# Patient Record
Sex: Female | Born: 1935 | Race: Black or African American | Hispanic: No | Marital: Married | Smoking: Never smoker
Health system: Southern US, Community
[De-identification: ages and names within clinical notes are randomized; demographics above are authoritative.]

## PROBLEM LIST (undated history)

## (undated) DIAGNOSIS — I679 Cerebrovascular disease, unspecified: Secondary | ICD-10-CM

## (undated) DIAGNOSIS — I10 Essential (primary) hypertension: Secondary | ICD-10-CM

## (undated) DIAGNOSIS — Z7901 Long term (current) use of anticoagulants: Secondary | ICD-10-CM

## (undated) DIAGNOSIS — N183 Chronic kidney disease, stage 3 unspecified: Secondary | ICD-10-CM

## (undated) DIAGNOSIS — M720 Palmar fascial fibromatosis [Dupuytren]: Secondary | ICD-10-CM

## (undated) DIAGNOSIS — I482 Chronic atrial fibrillation, unspecified: Secondary | ICD-10-CM

## (undated) DIAGNOSIS — E119 Type 2 diabetes mellitus without complications: Secondary | ICD-10-CM

## (undated) DIAGNOSIS — E785 Hyperlipidemia, unspecified: Secondary | ICD-10-CM

## (undated) DIAGNOSIS — C189 Malignant neoplasm of colon, unspecified: Secondary | ICD-10-CM

## (undated) HISTORY — DX: Malignant neoplasm of colon, unspecified: C18.9

## (undated) HISTORY — DX: Type 2 diabetes mellitus without complications: E11.9

## (undated) HISTORY — DX: Essential (primary) hypertension: I10

## (undated) HISTORY — DX: Chronic kidney disease, stage 3 (moderate): N18.3

## (undated) HISTORY — DX: Chronic kidney disease, stage 3 unspecified: N18.30

## (undated) HISTORY — PX: APPENDECTOMY: SHX54

## (undated) HISTORY — PX: COLONOSCOPY: SHX174

## (undated) HISTORY — DX: Long term (current) use of anticoagulants: Z79.01

## (undated) HISTORY — DX: Palmar fascial fibromatosis (dupuytren): M72.0

## (undated) HISTORY — PX: TUBAL LIGATION: SHX77

## (undated) HISTORY — DX: Cerebrovascular disease, unspecified: I67.9

## (undated) HISTORY — DX: Chronic atrial fibrillation, unspecified: I48.20

## (undated) HISTORY — DX: Hyperlipidemia, unspecified: E78.5

---

## 2006-02-20 ENCOUNTER — Inpatient Hospital Stay (HOSPITAL_COMMUNITY): Admission: EM | Admit: 2006-02-20 | Discharge: 2006-02-23 | Payer: Self-pay | Admitting: *Deleted

## 2006-02-20 ENCOUNTER — Encounter (INDEPENDENT_AMBULATORY_CARE_PROVIDER_SITE_OTHER): Payer: Self-pay | Admitting: Interventional Cardiology

## 2006-02-20 ENCOUNTER — Encounter (INDEPENDENT_AMBULATORY_CARE_PROVIDER_SITE_OTHER): Payer: Self-pay | Admitting: Neurology

## 2006-02-22 ENCOUNTER — Ambulatory Visit: Payer: Self-pay | Admitting: Physical Medicine & Rehabilitation

## 2006-02-23 ENCOUNTER — Inpatient Hospital Stay (HOSPITAL_COMMUNITY)
Admission: RE | Admit: 2006-02-23 | Discharge: 2006-03-02 | Payer: Self-pay | Admitting: Physical Medicine & Rehabilitation

## 2006-03-27 ENCOUNTER — Ambulatory Visit: Payer: Self-pay | Admitting: Physical Medicine & Rehabilitation

## 2006-03-27 ENCOUNTER — Encounter
Admission: RE | Admit: 2006-03-27 | Discharge: 2006-06-25 | Payer: Self-pay | Admitting: Physical Medicine & Rehabilitation

## 2006-05-12 ENCOUNTER — Ambulatory Visit: Payer: Self-pay | Admitting: Physical Medicine & Rehabilitation

## 2006-06-22 ENCOUNTER — Ambulatory Visit: Payer: Self-pay | Admitting: Physical Medicine & Rehabilitation

## 2006-06-22 ENCOUNTER — Encounter
Admission: RE | Admit: 2006-06-22 | Discharge: 2006-09-20 | Payer: Self-pay | Admitting: Physical Medicine & Rehabilitation

## 2006-06-29 ENCOUNTER — Encounter (HOSPITAL_COMMUNITY)
Admission: RE | Admit: 2006-06-29 | Discharge: 2006-07-17 | Payer: Self-pay | Admitting: Physical Medicine & Rehabilitation

## 2006-07-20 ENCOUNTER — Encounter (HOSPITAL_COMMUNITY)
Admission: RE | Admit: 2006-07-20 | Discharge: 2006-08-19 | Payer: Self-pay | Admitting: Physical Medicine & Rehabilitation

## 2006-08-04 ENCOUNTER — Encounter
Admission: RE | Admit: 2006-08-04 | Discharge: 2006-11-02 | Payer: Self-pay | Admitting: Physical Medicine & Rehabilitation

## 2006-08-07 ENCOUNTER — Ambulatory Visit: Payer: Self-pay | Admitting: Physical Medicine & Rehabilitation

## 2006-09-26 ENCOUNTER — Ambulatory Visit: Payer: Self-pay | Admitting: Physical Medicine & Rehabilitation

## 2007-07-19 DIAGNOSIS — C189 Malignant neoplasm of colon, unspecified: Secondary | ICD-10-CM

## 2007-07-19 HISTORY — DX: Malignant neoplasm of colon, unspecified: C18.9

## 2007-12-17 HISTORY — PX: HEMICOLECTOMY: SHX854

## 2008-01-04 ENCOUNTER — Ambulatory Visit (HOSPITAL_COMMUNITY): Admission: RE | Admit: 2008-01-04 | Discharge: 2008-01-04 | Payer: Self-pay | Admitting: Internal Medicine

## 2008-01-07 ENCOUNTER — Ambulatory Visit: Payer: Self-pay | Admitting: Internal Medicine

## 2008-01-07 ENCOUNTER — Ambulatory Visit: Payer: Self-pay | Admitting: Gastroenterology

## 2008-01-07 ENCOUNTER — Inpatient Hospital Stay (HOSPITAL_COMMUNITY): Admission: AD | Admit: 2008-01-07 | Discharge: 2008-01-19 | Payer: Self-pay | Admitting: Internal Medicine

## 2008-01-08 ENCOUNTER — Encounter: Payer: Self-pay | Admitting: Internal Medicine

## 2008-01-08 ENCOUNTER — Ambulatory Visit: Payer: Self-pay | Admitting: Internal Medicine

## 2008-01-11 ENCOUNTER — Encounter (INDEPENDENT_AMBULATORY_CARE_PROVIDER_SITE_OTHER): Payer: Self-pay | Admitting: General Surgery

## 2008-01-14 ENCOUNTER — Encounter: Payer: Self-pay | Admitting: Cardiology

## 2008-01-18 ENCOUNTER — Ambulatory Visit: Payer: Self-pay | Admitting: Oncology

## 2008-01-30 ENCOUNTER — Ambulatory Visit: Payer: Self-pay | Admitting: Cardiology

## 2008-02-06 ENCOUNTER — Ambulatory Visit: Payer: Self-pay | Admitting: Cardiology

## 2008-02-06 ENCOUNTER — Ambulatory Visit (HOSPITAL_COMMUNITY): Admission: RE | Admit: 2008-02-06 | Discharge: 2008-02-06 | Payer: Self-pay | Admitting: Cardiology

## 2008-03-12 ENCOUNTER — Ambulatory Visit: Payer: Self-pay | Admitting: Cardiology

## 2008-04-09 ENCOUNTER — Ambulatory Visit: Payer: Self-pay | Admitting: Cardiology

## 2008-04-16 ENCOUNTER — Encounter (HOSPITAL_COMMUNITY): Admission: RE | Admit: 2008-04-16 | Discharge: 2008-05-16 | Payer: Self-pay | Admitting: Oncology

## 2008-04-16 ENCOUNTER — Ambulatory Visit (HOSPITAL_COMMUNITY): Payer: Self-pay | Admitting: Oncology

## 2008-07-17 ENCOUNTER — Ambulatory Visit (HOSPITAL_COMMUNITY): Payer: Self-pay | Admitting: Oncology

## 2008-07-17 ENCOUNTER — Encounter (HOSPITAL_COMMUNITY): Admission: RE | Admit: 2008-07-17 | Discharge: 2008-08-16 | Payer: Self-pay | Admitting: Oncology

## 2008-10-08 ENCOUNTER — Encounter (HOSPITAL_COMMUNITY): Admission: RE | Admit: 2008-10-08 | Discharge: 2008-11-07 | Payer: Self-pay | Admitting: Oncology

## 2008-10-08 ENCOUNTER — Ambulatory Visit (HOSPITAL_COMMUNITY): Payer: Self-pay | Admitting: Oncology

## 2008-11-14 ENCOUNTER — Ambulatory Visit: Payer: Self-pay | Admitting: Cardiology

## 2008-11-20 ENCOUNTER — Ambulatory Visit: Payer: Self-pay | Admitting: Cardiology

## 2008-11-27 ENCOUNTER — Ambulatory Visit: Payer: Self-pay | Admitting: Cardiology

## 2008-12-04 ENCOUNTER — Ambulatory Visit: Payer: Self-pay | Admitting: Cardiology

## 2008-12-10 IMAGING — CR DG CHEST 2V
2 series · 2 of 2 positions shown · non-contrast
Comparison: 01/07/2008

CLINICAL DATA: Preop for GI bleed.

CHEST - 2 VIEW

[view not recorded (1 of 2)]
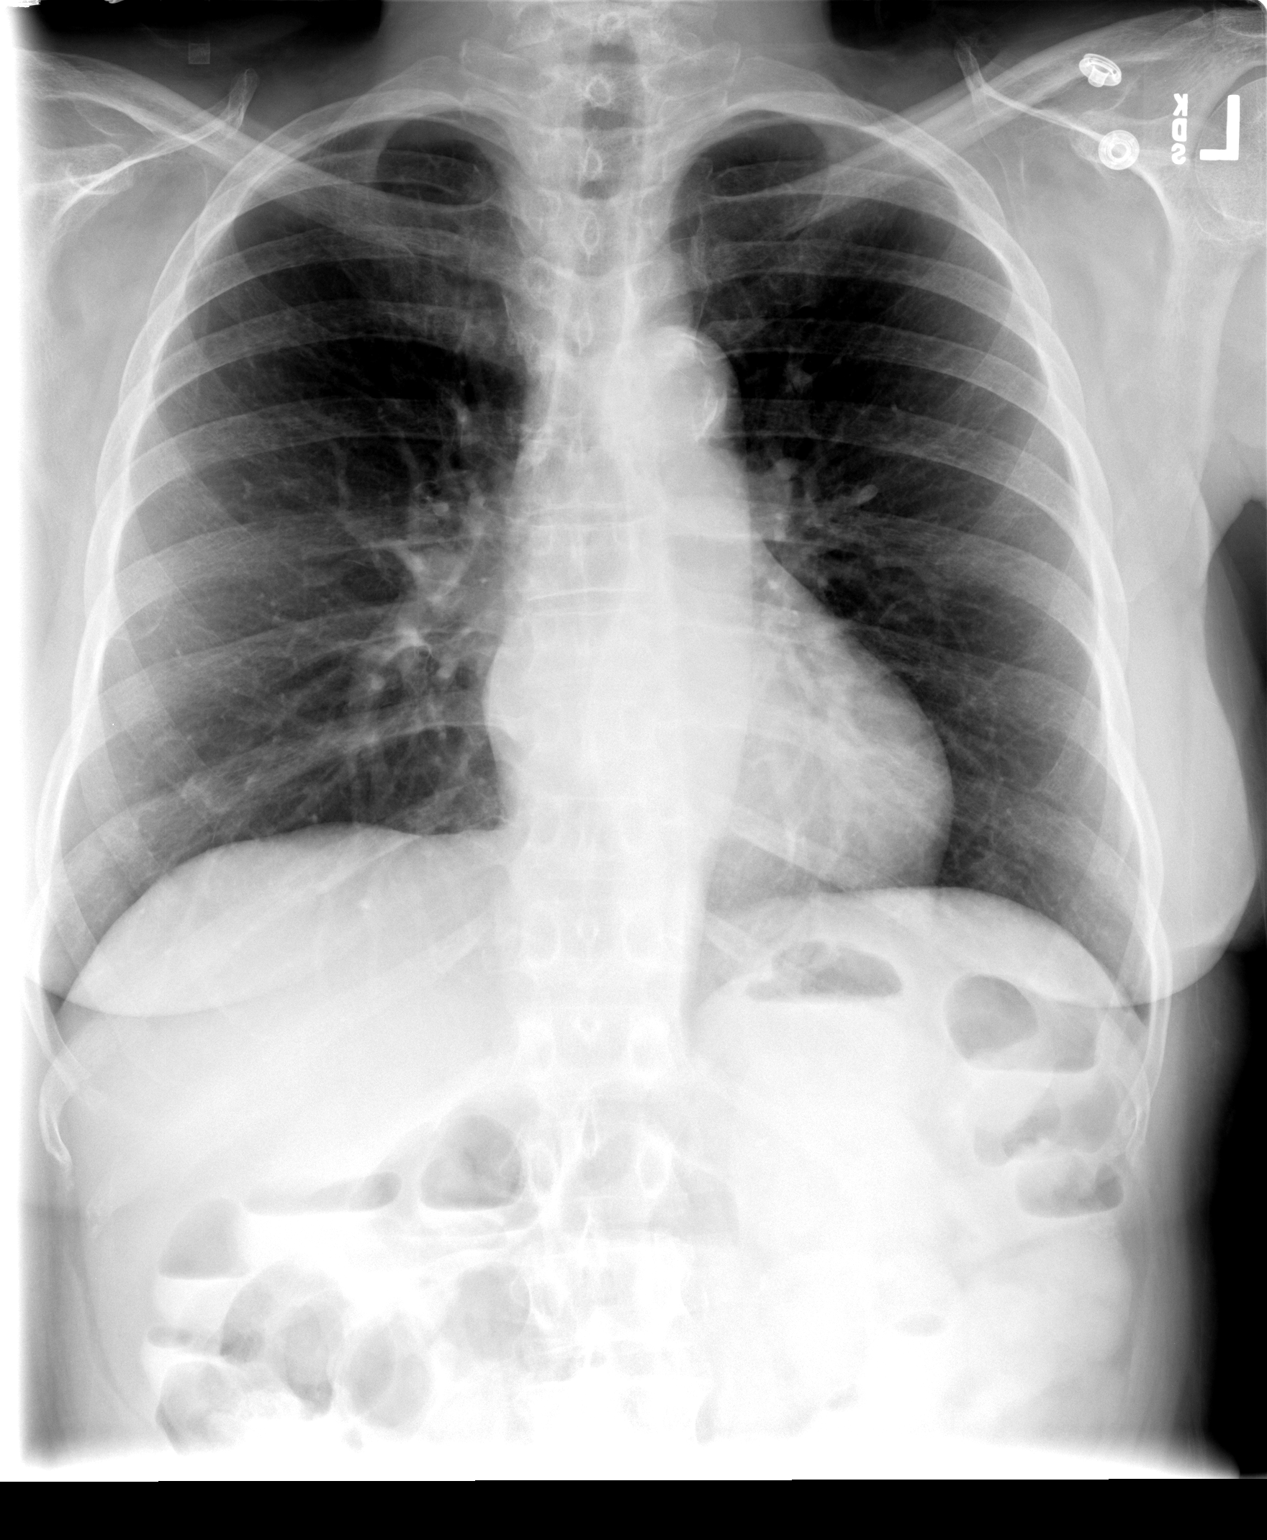

[view not recorded (2 of 2)]
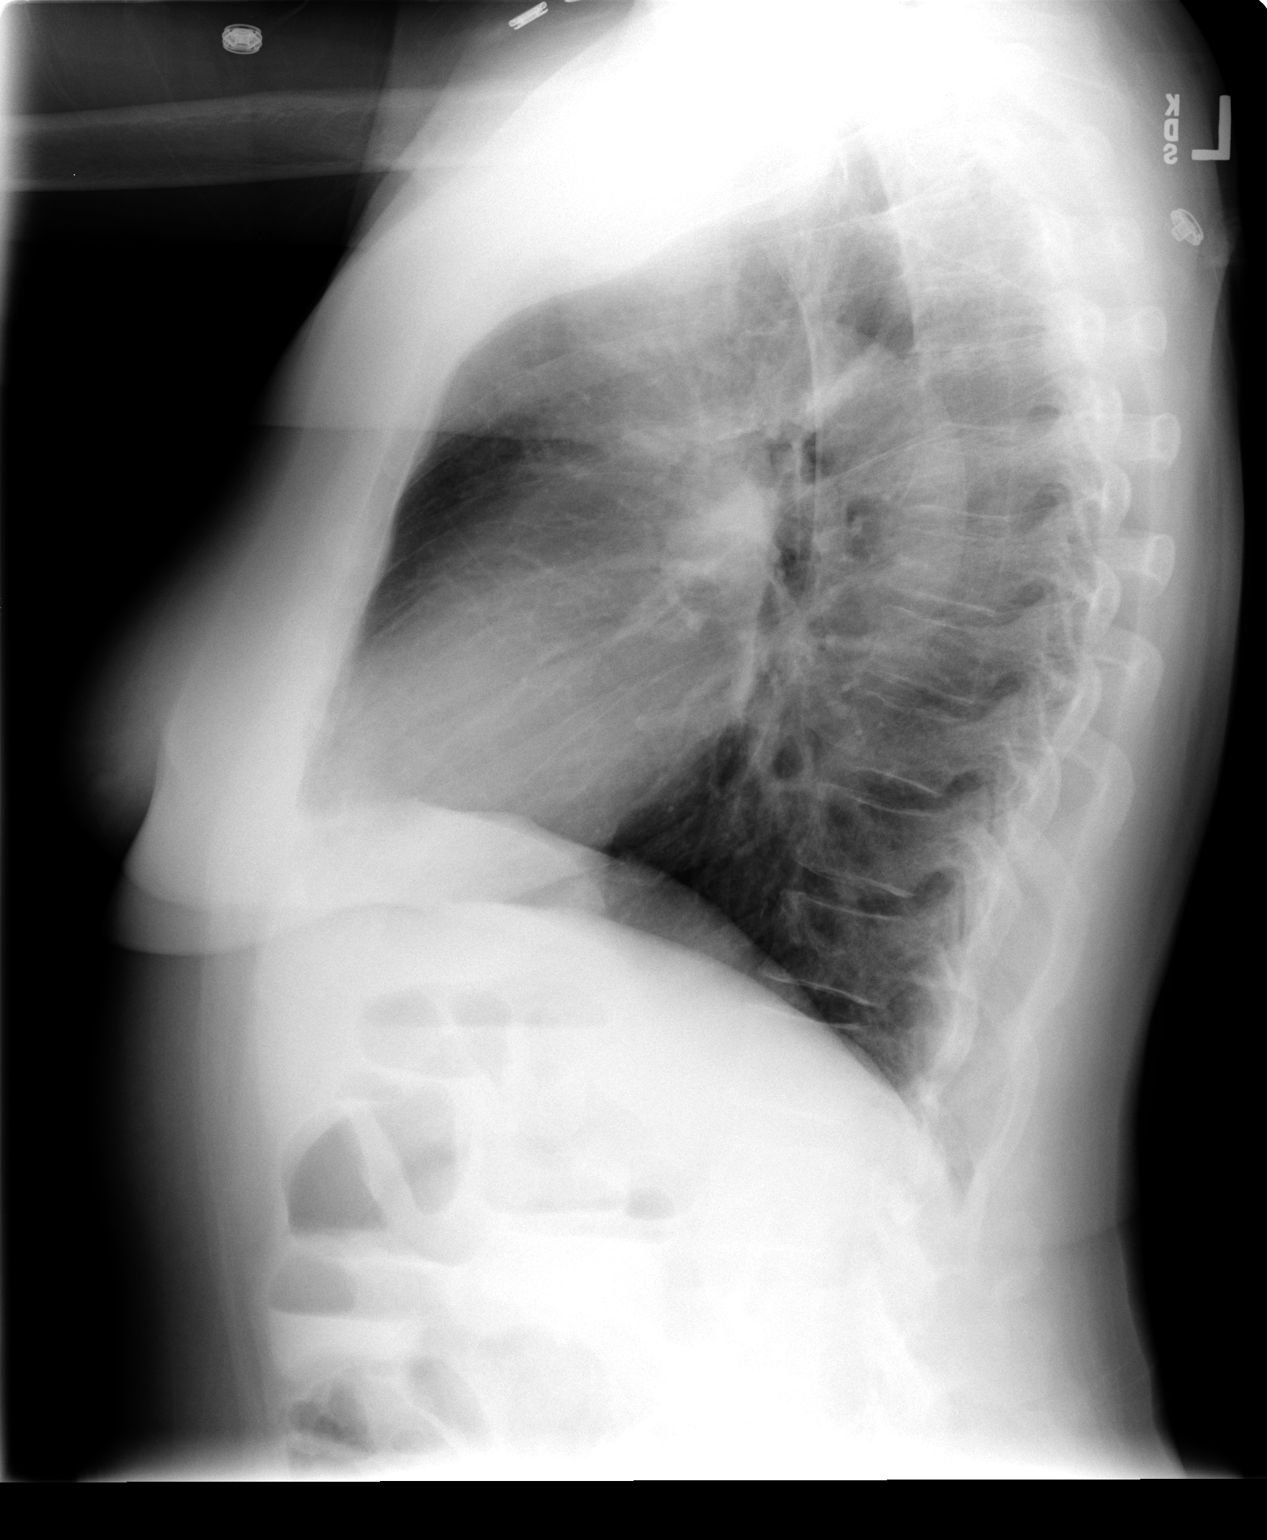

[2 of 2 positions shown; findings below may reference images not displayed]

FINDINGS: Midline trachea. Normal heart size.  Atherosclerotic
transverse aorta with mildly tortuous descending aorta. No pleural
effusion or pneumothorax. Clear lungs. Incidental note is made of
air fluid levels within the upper abdomen.  No free intraperitoneal
air.
IMPRESSION: 1. No acute cardiopulmonary disease.
2.  Scattered abdominal air fluid levels are nonspecific.

## 2008-12-11 ENCOUNTER — Ambulatory Visit: Payer: Self-pay | Admitting: Cardiology

## 2008-12-24 ENCOUNTER — Telehealth: Payer: Self-pay | Admitting: Cardiology

## 2008-12-25 ENCOUNTER — Ambulatory Visit: Payer: Self-pay | Admitting: Cardiology

## 2008-12-31 ENCOUNTER — Ambulatory Visit (HOSPITAL_COMMUNITY): Payer: Self-pay | Admitting: Oncology

## 2008-12-31 ENCOUNTER — Encounter (HOSPITAL_COMMUNITY): Admission: RE | Admit: 2008-12-31 | Discharge: 2009-01-30 | Payer: Self-pay | Admitting: Oncology

## 2009-01-05 ENCOUNTER — Encounter: Payer: Self-pay | Admitting: Cardiology

## 2009-01-05 ENCOUNTER — Ambulatory Visit: Payer: Self-pay | Admitting: Cardiology

## 2009-01-05 DIAGNOSIS — C189 Malignant neoplasm of colon, unspecified: Secondary | ICD-10-CM | POA: Insufficient documentation

## 2009-01-05 DIAGNOSIS — E119 Type 2 diabetes mellitus without complications: Secondary | ICD-10-CM | POA: Insufficient documentation

## 2009-01-05 DIAGNOSIS — I10 Essential (primary) hypertension: Secondary | ICD-10-CM | POA: Insufficient documentation

## 2009-01-05 DIAGNOSIS — I4891 Unspecified atrial fibrillation: Secondary | ICD-10-CM | POA: Insufficient documentation

## 2009-01-05 DIAGNOSIS — D649 Anemia, unspecified: Secondary | ICD-10-CM | POA: Insufficient documentation

## 2009-01-05 DIAGNOSIS — N182 Chronic kidney disease, stage 2 (mild): Secondary | ICD-10-CM | POA: Insufficient documentation

## 2009-01-05 DIAGNOSIS — E785 Hyperlipidemia, unspecified: Secondary | ICD-10-CM | POA: Insufficient documentation

## 2009-01-05 DIAGNOSIS — E875 Hyperkalemia: Secondary | ICD-10-CM | POA: Insufficient documentation

## 2009-01-22 ENCOUNTER — Ambulatory Visit: Payer: Self-pay | Admitting: Cardiology

## 2009-02-04 ENCOUNTER — Encounter: Payer: Self-pay | Admitting: Internal Medicine

## 2009-02-16 ENCOUNTER — Ambulatory Visit (HOSPITAL_COMMUNITY): Admission: RE | Admit: 2009-02-16 | Discharge: 2009-02-16 | Payer: Self-pay | Admitting: Internal Medicine

## 2009-02-16 ENCOUNTER — Ambulatory Visit: Payer: Self-pay | Admitting: Internal Medicine

## 2009-02-16 ENCOUNTER — Encounter: Payer: Self-pay | Admitting: Internal Medicine

## 2009-02-18 ENCOUNTER — Encounter: Payer: Self-pay | Admitting: Internal Medicine

## 2009-02-26 ENCOUNTER — Ambulatory Visit: Payer: Self-pay

## 2009-02-26 ENCOUNTER — Encounter: Payer: Self-pay | Admitting: Cardiology

## 2009-03-02 ENCOUNTER — Encounter: Payer: Self-pay | Admitting: *Deleted

## 2009-03-11 ENCOUNTER — Ambulatory Visit: Payer: Self-pay

## 2009-04-03 ENCOUNTER — Encounter (HOSPITAL_COMMUNITY): Admission: RE | Admit: 2009-04-03 | Discharge: 2009-04-15 | Payer: Self-pay | Admitting: Oncology

## 2009-04-03 ENCOUNTER — Ambulatory Visit (HOSPITAL_COMMUNITY): Payer: Self-pay | Admitting: Oncology

## 2009-04-09 ENCOUNTER — Ambulatory Visit: Payer: Self-pay | Admitting: Cardiology

## 2009-04-22 ENCOUNTER — Ambulatory Visit: Payer: Self-pay | Admitting: Cardiology

## 2009-04-22 LAB — CONVERTED CEMR LAB: POC INR: 2.6

## 2009-05-07 ENCOUNTER — Encounter (INDEPENDENT_AMBULATORY_CARE_PROVIDER_SITE_OTHER): Payer: Self-pay | Admitting: *Deleted

## 2009-05-15 ENCOUNTER — Encounter (HOSPITAL_COMMUNITY): Admission: RE | Admit: 2009-05-15 | Discharge: 2009-06-14 | Payer: Self-pay | Admitting: Oncology

## 2009-05-18 ENCOUNTER — Ambulatory Visit: Payer: Self-pay | Admitting: Cardiology

## 2009-05-18 HISTORY — PX: COLONOSCOPY W/ POLYPECTOMY: SHX1380

## 2009-05-28 ENCOUNTER — Encounter: Payer: Self-pay | Admitting: Internal Medicine

## 2009-06-16 ENCOUNTER — Ambulatory Visit (HOSPITAL_COMMUNITY): Admission: RE | Admit: 2009-06-16 | Discharge: 2009-06-16 | Payer: Self-pay | Admitting: Internal Medicine

## 2009-06-16 ENCOUNTER — Encounter: Payer: Self-pay | Admitting: Cardiology

## 2009-06-16 ENCOUNTER — Encounter (INDEPENDENT_AMBULATORY_CARE_PROVIDER_SITE_OTHER): Payer: Self-pay | Admitting: *Deleted

## 2009-06-16 ENCOUNTER — Ambulatory Visit: Payer: Self-pay | Admitting: Internal Medicine

## 2009-06-18 ENCOUNTER — Ambulatory Visit: Payer: Self-pay | Admitting: Cardiology

## 2009-06-18 ENCOUNTER — Encounter: Payer: Self-pay | Admitting: Internal Medicine

## 2009-07-06 ENCOUNTER — Encounter: Payer: Self-pay | Admitting: Cardiology

## 2009-07-08 ENCOUNTER — Ambulatory Visit: Payer: Self-pay | Admitting: Cardiology

## 2009-07-12 LAB — CONVERTED CEMR LAB
AST: 16 units/L (ref 0–37)
Albumin: 4.2 g/dL (ref 3.5–5.2)
Alkaline Phosphatase: 128 units/L — ABNORMAL HIGH (ref 39–117)
Casts: NONE SEEN /lpf
Chloride: 103 meq/L (ref 96–112)
Crystals: NONE SEEN
Glucose, Bld: 103 mg/dL — ABNORMAL HIGH (ref 70–99)
LDL Cholesterol: 64 mg/dL (ref 0–99)
Nitrite: NEGATIVE
Potassium: 3.1 meq/L — ABNORMAL LOW (ref 3.5–5.3)
Sodium: 145 meq/L (ref 135–145)
Specific Gravity, Urine: 1.023 (ref 1.005–1.030)
Total Protein: 7.2 g/dL (ref 6.0–8.3)
Triglycerides: 95 mg/dL (ref <150)
pH: 6 (ref 5.0–8.0)

## 2009-07-13 ENCOUNTER — Encounter (INDEPENDENT_AMBULATORY_CARE_PROVIDER_SITE_OTHER): Payer: Self-pay | Admitting: *Deleted

## 2009-07-13 ENCOUNTER — Encounter: Payer: Self-pay | Admitting: Cardiology

## 2009-08-03 ENCOUNTER — Ambulatory Visit: Payer: Self-pay | Admitting: Cardiology

## 2009-08-10 ENCOUNTER — Ambulatory Visit (HOSPITAL_COMMUNITY): Payer: Self-pay | Admitting: Oncology

## 2009-08-10 ENCOUNTER — Encounter (HOSPITAL_COMMUNITY): Admission: RE | Admit: 2009-08-10 | Discharge: 2009-09-09 | Payer: Self-pay | Admitting: Oncology

## 2009-08-13 ENCOUNTER — Encounter (INDEPENDENT_AMBULATORY_CARE_PROVIDER_SITE_OTHER): Payer: Self-pay | Admitting: *Deleted

## 2009-08-13 LAB — CONVERTED CEMR LAB
BUN: 15 mg/dL
BUN: 15 mg/dL (ref 6–23)
CO2: 26 meq/L (ref 19–32)
Chloride: 106 meq/L
Chloride: 106 meq/L (ref 96–112)
Glucose, Bld: 100 mg/dL
Glucose, Bld: 100 mg/dL — ABNORMAL HIGH (ref 70–99)
Potassium: 4.2 meq/L
Potassium: 4.2 meq/L (ref 3.5–5.3)

## 2009-08-31 ENCOUNTER — Ambulatory Visit: Payer: Self-pay | Admitting: Cardiology

## 2009-09-28 ENCOUNTER — Ambulatory Visit: Payer: Self-pay | Admitting: Cardiology

## 2009-09-28 LAB — CONVERTED CEMR LAB: POC INR: 3.4

## 2009-10-26 ENCOUNTER — Ambulatory Visit: Payer: Self-pay | Admitting: Cardiology

## 2009-11-02 ENCOUNTER — Encounter (HOSPITAL_COMMUNITY): Admission: RE | Admit: 2009-11-02 | Discharge: 2009-12-02 | Payer: Self-pay | Admitting: Oncology

## 2009-11-02 ENCOUNTER — Ambulatory Visit (HOSPITAL_COMMUNITY): Payer: Self-pay | Admitting: Oncology

## 2009-11-11 ENCOUNTER — Encounter (INDEPENDENT_AMBULATORY_CARE_PROVIDER_SITE_OTHER): Payer: Self-pay | Admitting: *Deleted

## 2009-11-17 ENCOUNTER — Ambulatory Visit: Payer: Self-pay | Admitting: Cardiology

## 2009-11-17 DIAGNOSIS — I679 Cerebrovascular disease, unspecified: Secondary | ICD-10-CM | POA: Insufficient documentation

## 2009-11-20 ENCOUNTER — Encounter: Payer: Self-pay | Admitting: Cardiology

## 2009-12-15 ENCOUNTER — Encounter (INDEPENDENT_AMBULATORY_CARE_PROVIDER_SITE_OTHER): Payer: Self-pay | Admitting: *Deleted

## 2009-12-16 ENCOUNTER — Ambulatory Visit: Payer: Self-pay | Admitting: Cardiology

## 2009-12-16 LAB — CONVERTED CEMR LAB: POC INR: 2.4

## 2010-01-14 ENCOUNTER — Ambulatory Visit: Payer: Self-pay | Admitting: Cardiology

## 2010-02-25 ENCOUNTER — Ambulatory Visit: Payer: Self-pay | Admitting: Cardiology

## 2010-03-25 ENCOUNTER — Ambulatory Visit: Payer: Self-pay | Admitting: Cardiology

## 2010-03-25 LAB — CONVERTED CEMR LAB: POC INR: 3.3

## 2010-04-22 ENCOUNTER — Ambulatory Visit: Payer: Self-pay | Admitting: Cardiology

## 2010-04-22 LAB — CONVERTED CEMR LAB: POC INR: 2.6

## 2010-05-20 ENCOUNTER — Ambulatory Visit: Payer: Self-pay | Admitting: Cardiology

## 2010-06-24 ENCOUNTER — Ambulatory Visit: Payer: Self-pay | Admitting: Cardiology

## 2010-07-28 ENCOUNTER — Ambulatory Visit: Admission: RE | Admit: 2010-07-28 | Discharge: 2010-07-28 | Payer: Self-pay | Source: Home / Self Care

## 2010-07-28 LAB — CONVERTED CEMR LAB: POC INR: 2.5

## 2010-08-08 ENCOUNTER — Encounter: Payer: Self-pay | Admitting: Internal Medicine

## 2010-08-17 NOTE — Medication Information (Signed)
Summary: CCR  Anticoagulant Therapy  Managed by: Vashti Hey, RN Referring MD: [place] Bing PCP: Dr. Avon Gully Supervising MD: Daleen Squibb MD, Maisie Fus Indication 1: Atrial Fibrillation (ICD-427.31) Lab Used: [facility] [place] Site: [place] INR POC 2.9  Dietary changes: no    Health status changes: no    Bleeding/hemorrhagic complications: no    Recent/future hospitalizations: no    Any changes in medication regimen? no    Recent/future dental: no  Any missed doses?: no       Is patient compliant with meds? yes       Allergies: No Known Drug Allergies  Anticoagulation Management History:      The patient is taking warfarin and comes in today for a routine follow up visit.  Positive risk factors for bleeding include an age of 70 years or older, history of CVA/TIA, and presence of serious comorbidities.  The bleeding index is 'high risk'.  Positive CHADS2 values include History of HTN, Age > 108 years old, History of Diabetes, and Prior Stroke/CVA/TIA.  The start date was 11/14/2008.  Anticoagulation responsible provider: Daleen Squibb MD, Maisie Fus.  INR POC: 2.9.  Cuvette Lot#: [id number].  Exp: 11-2010.    Anticoagulation Management Assessment/Plan:      The patient's current anticoagulation dose is Warfarin sodium 5 mg tabs: as directed.  The target INR is 2 - 3.  The next INR is due 07/22/2010.  Anticoagulation instructions were given to patient.  Results were reviewed/authorized by Vashti Hey, RN.  She was notified by Vashti Hey RN.         Prior Anticoagulation Instructions: INR 2.5 Continue coumadin 5mg  once daily except 7.5mg  on Tuesdays  Current Anticoagulation Instructions: INR 2.9 Continue coumadin 5mg  once daily except 7.5mg  on Tuesdays

## 2010-08-17 NOTE — Medication Information (Signed)
Summary: ccr-lr  Anticoagulant Therapy  Managed by: Vashti Hey, RN Referring MD: [place] Bing PCP: Dr. Avon Gully Supervising MD: Dietrich Pates MD, Molly Maduro Indication 1: Atrial Fibrillation (ICD-427.31) Lab Used: [facility] [place] Site: [place] INR POC 3.3  Dietary changes: no    Health status changes: no    Bleeding/hemorrhagic complications: no    Recent/future hospitalizations: no    Any changes in medication regimen? no    Recent/future dental: no  Any missed doses?: no       Is patient compliant with meds? yes       Allergies: No Known Drug Allergies  Anticoagulation Management History:      The patient is taking warfarin and comes in today for a routine follow up visit.  Positive risk factors for bleeding include an age of 15 years or older, history of CVA/TIA, and presence of serious comorbidities.  The bleeding index is 'high risk'.  Positive CHADS2 values include History of HTN, Age > 2 years old, History of Diabetes, and Prior Stroke/CVA/TIA.  The start date was 11/14/2008.  Anticoagulation responsible provider: Dietrich Pates MD, Molly Maduro.  INR POC: 3.3.  Cuvette Lot#: [id number].  Exp: 11-2010.    Anticoagulation Management Assessment/Plan:      The patient's current anticoagulation dose is Warfarin sodium 5 mg tabs: as directed.  The target INR is 2 - 3.  The next INR is due 04/22/2010.  Anticoagulation instructions were given to patient.  Results were reviewed/authorized by Vashti Hey, RN.  She was notified by Vashti Hey RN.         Prior Anticoagulation Instructions: INR 2.9 Continue coumadin 5mg  once daily except 7.5mg  on Tuesdays   Current Anticoagulation Instructions: INR 3.3 Hold coumadin tonight then resume 5mg  once daily except 7.5mg  on Tuesdays

## 2010-08-17 NOTE — Medication Information (Signed)
Summary: ccr-lr  Anticoagulant Therapy  Managed by: Vashti Hey, RN Referring MD: [place] Bing PCP: Bari Edward MD: Dietrich Pates MD, Molly Maduro Indication 1: Atrial Fibrillation (ICD-427.31) Lab Used: [facility] [place] Site: [place] INR POC 2.0  Dietary changes: no    Health status changes: no    Bleeding/hemorrhagic complications: no    Recent/future hospitalizations: no    Any changes in medication regimen? no    Recent/future dental: no  Any missed doses?: no       Is patient compliant with meds? yes       Allergies: No Known Drug Allergies  Anticoagulation Management History:      The patient is taking warfarin and comes in today for a routine follow up visit.  Positive risk factors for bleeding include an age of 11 years or older, history of CVA/TIA, and presence of serious comorbidities.  The bleeding index is 'high risk'.  Positive CHADS2 values include History of HTN, History of Diabetes, and Prior Stroke/CVA/TIA.  Negative CHADS2 values include Age > 39 years old.  The start date was 11/14/2008.  Anticoagulation responsible provider: Dietrich Pates MD, Molly Maduro.  INR POC: 2.0.  Cuvette Lot#: [id number].  Exp: 10/11.    Anticoagulation Management Assessment/Plan:      The patient's current anticoagulation dose is Warfarin sodium 5 mg tabs: as directed.  The target INR is 2 - 3.  The next INR is due 08/31/2009.  Anticoagulation instructions were given to patient.  Results were reviewed/authorized by Vashti Hey, RN.  She was notified by Vashti Hey RN.         Prior Anticoagulation Instructions: INR 2.1 Continue coumadin 5mg  once daily except 7.5mg  on Tuesdays  Current Anticoagulation Instructions: INR 2.0 Take coumadin 1 1/2 tablets today then resume 1 tablet once daily except 1 1/2 tablets on Tuesdays

## 2010-08-17 NOTE — Medication Information (Signed)
Summary: ccr-lr  Anticoagulant Therapy  Managed by: Vashti Hey, RN Referring MD: [place] Bing PCP: Bari Edward MD: Dietrich Pates MD, Molly Maduro Indication 1: Atrial Fibrillation (ICD-427.31) Lab Used: [facility] [place] Site: [employer] [place] [employer] INR POC 3.1  Dietary changes: no    Health status changes: no    Bleeding/hemorrhagic complications: no    Recent/future hospitalizations: no    Any changes in medication regimen? no    Recent/future dental: no  Any missed doses?: no       Is patient compliant with meds? yes       Allergies: No Known Drug Allergies  Anticoagulation Management History:      The patient is taking warfarin and comes in today for a routine follow up visit.  Positive risk factors for bleeding include an age of 75 years or older, history of CVA/TIA, and presence of serious comorbidities.  The bleeding index is 'high risk'.  Positive CHADS2 values include History of HTN, Age > 75 years old, History of Diabetes, and Prior Stroke/CVA/TIA.  The start date was 11/14/2008.  Anticoagulation responsible provider: Dietrich Pates MD, Molly Maduro.  INR POC: 3.1.  Cuvette Lot#: [id number].  Exp: 10/11.    Anticoagulation Management Assessment/Plan:      The patient's current anticoagulation dose is Warfarin sodium 5 mg tabs: as directed.  The target INR is 2 - 3.  The next INR is due 09/28/2009.  Anticoagulation instructions were given to patient.  Results were reviewed/authorized by Vashti Hey, RN.  She was notified by Vashti Hey RN.         Prior Anticoagulation Instructions: INR 2.0 Take coumadin 1 1/2 tablets today then resume 1 tablet once daily except 1 1/2 tablets on Tuesdays  Current Anticoagulation Instructions: INR 3.1 Take coumadin 1/2 tablet tonight then resume 1 tablet once daily except 1 1/2 tablets on Tuesdays

## 2010-08-17 NOTE — Medication Information (Signed)
Summary: ccr-lr  Anticoagulant Therapy  Managed by: Vashti Hey, RN Referring MD: [place] Bing PCP: Dr. Avon Gully Supervising MD: Dietrich Pates MD, Molly Maduro Indication 1: Atrial Fibrillation (ICD-427.31) Lab Used: [facility] [place] Site: [place] INR POC 2.9  Dietary changes: no    Health status changes: no    Bleeding/hemorrhagic complications: no    Recent/future hospitalizations: no    Any changes in medication regimen? no    Recent/future dental: no  Any missed doses?: no       Is patient compliant with meds? yes       Allergies: No Known Drug Allergies  Anticoagulation Management History:      The patient is taking warfarin and comes in today for a routine follow up visit.  Positive risk factors for bleeding include an age of 75 years or older, history of CVA/TIA, and presence of serious comorbidities.  The bleeding index is 'high risk'.  Positive CHADS2 values include History of HTN, Age > 54 years old, History of Diabetes, and Prior Stroke/CVA/TIA.  The start date was 11/14/2008.  Anticoagulation responsible provider: Dietrich Pates MD, Molly Maduro.  INR POC: 2.9.  Cuvette Lot#: [id number].  Exp: 11-2010.    Anticoagulation Management Assessment/Plan:      The patient's current anticoagulation dose is Warfarin sodium 5 mg tabs: as directed.  The target INR is 2 - 3.  The next INR is due 03/25/2010.  Anticoagulation instructions were given to patient.  Results were reviewed/authorized by Vashti Hey, RN.  She was notified by Vashti Hey RN.         Prior Anticoagulation Instructions: INR 2.6 Continue coumadin 5mg  once daily except 7.5mg  on Tuesdays  Current Anticoagulation Instructions: INR 2.9 Continue coumadin 5mg  once daily except 7.5mg  on Tuesdays

## 2010-08-17 NOTE — Miscellaneous (Signed)
Summary: labs bmp1/27/2011  Clinical Lists Changes  Observations: Added new observation of CALCIUM: 9.9 mg/dL (78/29/5621 3:08) Added new observation of CREATININE: 1.34 mg/dL (65/78/4696 2:95) Added new observation of BUN: 15 mg/dL (28/41/3244 0:10) Added new observation of BG RANDOM: 100 mg/dL (27/25/3664 4:03) Added new observation of CO2 PLSM/SER: 26 meq/L (08/13/2009 8:48) Added new observation of CL SERUM: 106 meq/L (08/13/2009 8:48) Added new observation of K SERUM: 4.2 meq/L (08/13/2009 8:48) Added new observation of NA: 142 meq/L (08/13/2009 8:48)

## 2010-08-17 NOTE — Medication Information (Signed)
Summary: ccr-lr  Anticoagulant Therapy  Managed by: Vashti Hey, RN Referring MD: [place] Bing PCP: Bari Edward MD: Dietrich Pates MD, Molly Maduro Indication 1: Atrial Fibrillation (ICD-427.31) Lab Used: [facility] [place] Site: [place] INR POC 2.2  Dietary changes: no    Health status changes: no    Bleeding/hemorrhagic complications: no    Recent/future hospitalizations: no    Any changes in medication regimen? no    Recent/future dental: no  Any missed doses?: no       Is patient compliant with meds? yes       Allergies: No Known Drug Allergies  Anticoagulation Management History:      The patient is taking warfarin and comes in today for a routine follow up visit.  Positive risk factors for bleeding include an age of 84 years or older, history of CVA/TIA, and presence of serious comorbidities.  The bleeding index is 'high risk'.  Positive CHADS2 values include History of HTN, Age > 48 years old, History of Diabetes, and Prior Stroke/CVA/TIA.  The start date was 11/14/2008.  Anticoagulation responsible provider: Dietrich Pates MD, Molly Maduro.  INR POC: 2.2.  Cuvette Lot#: [id number].  Exp: 10/11.    Anticoagulation Management Assessment/Plan:      The patient's current anticoagulation dose is Warfarin sodium 5 mg tabs: as directed.  The target INR is 2 - 3.  The next INR is due 11/23/2009.  Anticoagulation instructions were given to patient.  Results were reviewed/authorized by Vashti Hey, RN.  She was notified by Vashti Hey RN.         Prior Anticoagulation Instructions: INR 3.4 Hold coumadin tonight then resume 5mg  once daily except 7.5mg  on Tuesdays  Current Anticoagulation Instructions: INR 2.2 Continue coumadin 5mg  once daily except 7.5mg  on Tuesdays

## 2010-08-17 NOTE — Medication Information (Signed)
Summary: ccr-lr  Anticoagulant Therapy  Managed by: Vashti Hey, RN Referring MD: [place] Bing PCP: Dr. Avon Gully Supervising MD: Dietrich Pates MD, Molly Maduro Indication 1: Atrial Fibrillation (ICD-427.31) Lab Used: [facility]  Site: [place] INR POC 2.6  Dietary changes: no    Health status changes: no    Bleeding/hemorrhagic complications: no    Recent/future hospitalizations: no    Any changes in medication regimen? no    Recent/future dental: no  Any missed doses?: no       Is patient compliant with meds? yes       Allergies: No Known Drug Allergies  Anticoagulation Management History:      The patient is taking warfarin and comes in today for a routine follow up visit.  Positive risk factors for bleeding include an age of 75 years or older, history of CVA/TIA, and presence of serious comorbidities.  The bleeding index is 'high risk'.  Positive CHADS2 values include History of HTN, Age > 75 years old, History of Diabetes, and Prior Stroke/CVA/TIA.  The start date was 11/14/2008.  Anticoagulation responsible provider: Dietrich Pates MD, Molly Maduro.  INR POC: 2.6.  Cuvette Lot#: [id number].  Exp: 11-2010.    Anticoagulation Management Assessment/Plan:      The patient's current anticoagulation dose is Warfarin sodium 5 mg tabs: as directed.  The target INR is 2 - 3.  The next INR is due 02/11/2010.  Anticoagulation instructions were given to patient.  Results were reviewed/authorized by Vashti Hey, RN.  She was notified by Vashti Hey RN.         Prior Anticoagulation Instructions: INR 2.4 Continue coumadin 5mg  once daily except 7.5mg  on Tuesdays  Current Anticoagulation Instructions: INR 2.6 Continue coumadin 5mg  once daily except 7.5mg  on Tuesdays

## 2010-08-17 NOTE — Medication Information (Signed)
Summary: ccr-lr  Anticoagulant Therapy  Managed by: Vashti Hey, RN Referring MD: [place] Bing PCP: Dr. Avon Gully Supervising MD: Dietrich Pates MD, Molly Maduro Indication 1: Atrial Fibrillation (ICD-427.31) Lab Used: [facility] [place] Site: [place] INR POC 2.5  Dietary changes: no    Health status changes: no    Bleeding/hemorrhagic complications: no    Recent/future hospitalizations: no    Any changes in medication regimen? no    Recent/future dental: no  Any missed doses?: no       Is patient compliant with meds? yes       Allergies: No Known Drug Allergies  Anticoagulation Management History:      The patient is taking warfarin and comes in today for a routine follow up visit.  Positive risk factors for bleeding include an age of 58 years or older, history of CVA/TIA, and presence of serious comorbidities.  The bleeding index is 'high risk'.  Positive CHADS2 values include History of HTN, Age > 42 years old, History of Diabetes, and Prior Stroke/CVA/TIA.  The start date was 11/14/2008.  Anticoagulation responsible Donie [place]oul[place]: Dietrich Pates MD, Molly Maduro.  INR POC: 2.5.  Cuvette Lot#: [id number].  Exp: 11-2010.    Anticoagulation Management Assessment/Plan:      The patient's current anticoagulation dose is Warfarin sodium 5 mg tabs: as directed.  The target INR is 2 - 3.  The next INR is due 06/17/2010.  Anticoagulation instructions were given to patient.  Results were reviewed/authorized by Vashti Hey, RN.  She was notified by Vashti Hey RN.         Prior Anticoagulation Instructions: INR 2.6 Continue coumadin 5mg  once daily except 7.5mg  on Tuesdays  Current Anticoagulation Instructions: INR 2.5 Continue coumadin 5mg  once daily except 7.5mg  on Tuesdays

## 2010-08-17 NOTE — Miscellaneous (Signed)
Summary: potassium refills  Clinical Lists Changes  Medications: Changed medication from KLOR-CON 20 MEQ PACK (POTASSIUM CHLORIDE) take one tablet by mouth daily to POTASSIUM CHLORIDE CRYS CR 20 MEQ CR-TABS (POTASSIUM CHLORIDE CRYS CR) Take one tablet by mouth daily - Signed Rx of POTASSIUM CHLORIDE CRYS CR 20 MEQ CR-TABS (POTASSIUM CHLORIDE CRYS CR) Take one tablet by mouth daily;  #30 x 3;  Signed;  Entered by: Teressa Lower RN;  Authorized by: Kathlen Brunswick, MD, [facility];  Method used: Electronically to [facility] [address] [phone]*, [address] [place], [place]  [address], Ph: [phone], Fax: [phone]    Prescriptions: POTASSIUM CHLORIDE CRYS CR 20 MEQ CR-TABS (POTASSIUM CHLORIDE CRYS CR) Take one tablet by mouth daily  #30 x 3   Entered by:   Teressa Lower RN   Authorized by:   Kathlen Brunswick, MD, [facility]   Signed by:   Teressa Lower RN on 12/15/2009   Method used:   Electronically to        [facility] [phone]* (retail)       [address], [place]  [address]       Ph: [phone]       Fax: [phone]   RxID:   [id number]

## 2010-08-17 NOTE — Medication Information (Signed)
Summary: ccr-lr  Anticoagulant Therapy  Managed by: Vashti Hey, RN Referring MD: [place] Bing PCP: Bari Edward MD: Dietrich Pates MD, Molly Maduro Indication 1: Atrial Fibrillation (ICD-427.31) Lab Used: [facility] [place] Site: [place] INR POC 3.4  Dietary changes: no    Health status changes: yes       Details: had the flu  Bleeding/hemorrhagic complications: no    Recent/future hospitalizations: no    Any changes in medication regimen? yes       Details: been taking tylenol and OTC meds  Recent/future dental: no  Any missed doses?: no       Is patient compliant with meds? yes       Allergies: No Known Drug Allergies  Anticoagulation Management History:      The patient is taking warfarin and comes in today for a routine follow up visit.  Positive risk factors for bleeding include an age of 75 years or older, history of CVA/TIA, and presence of serious comorbidities.  The bleeding index is 'high risk'.  Positive CHADS2 values include History of HTN, Age > 2 years old, History of Diabetes, and Prior Stroke/CVA/TIA.  The start date was 11/14/2008.  Anticoagulation responsible provider: Dietrich Pates MD, Molly Maduro.  INR POC: 3.4.  Cuvette Lot#: [id number].  Exp: 10/11.    Anticoagulation Management Assessment/Plan:      The patient's current anticoagulation dose is Warfarin sodium 5 mg tabs: as directed.  The target INR is 2 - 3.  The next INR is due 10/26/2009.  Anticoagulation instructions were given to patient.  Results were reviewed/authorized by Vashti Hey, RN.  She was notified by Vashti Hey RN.         Prior Anticoagulation Instructions: INR 3.1 Take coumadin 1/2 tablet tonight then resume 1 tablet once daily except 1 1/2 tablets on Tuesdays  Current Anticoagulation Instructions: INR 3.4 Hold coumadin tonight then resume 5mg  once daily except 7.5mg  on Tuesdays

## 2010-08-17 NOTE — Medication Information (Signed)
Summary: CCR-LR  Anticoagulant Therapy  Managed by: Teressa Lower, RN Referring MD: [place] Bing PCP: Dr. Avon Gully Supervising MD: Dietrich Pates MD, Molly Maduro Indication 1: Atrial Fibrillation (ICD-427.31) Lab Used: [facility] [place] Site: [place] INR POC 2.2  Dietary changes: no    Health status changes: no    Bleeding/hemorrhagic complications: no    Recent/future hospitalizations: no    Any changes in medication regimen? no    Recent/future dental: no  Any missed doses?: no       Is patient compliant with meds? yes       Current Medications (verified): 1)  Simvastatin 20 Mg Tabs (Simvastatin) .Marland Kitchen.. 1 Tab Once Daily 2)  Amlodipine Besylate 10 Mg Tabs (Amlodipine Besylate) .Marland Kitchen.. 1 Tab Once Daily 3)  Glipizide 2.5 Mg Xr24h-Tab (Glipizide) .Marland Kitchen.. 1 Tab Once Daily 4)  Metformin Hcl 500 Mg Xr24h-Tab (Metformin Hcl) .Marland Kitchen.. 1 Tab Once Two Times A Day 5)  Hydrochlorothiazide 25 Mg Tabs (Hydrochlorothiazide) .... Take 1/2 Tab Once Daily 6)  Omeprazole 20 Mg Cpdr (Omeprazole) .Marland Kitchen.. 1 Tab Once Daily 7)  Metoprolol Tartrate 50 Mg Tabs (Metoprolol Tartrate) .Marland Kitchen.. 1 Tab Two Times A Day 8)  Warfarin Sodium 5 Mg Tabs (Warfarin Sodium) .... As Directed 9)  Ferrous Sulfate 325 (65 Fe) Mg Tabs (Ferrous Sulfate) .Marland Kitchen.. 1tab Once Daily 10)  Tylenol Extra Strength 500 Mg Tabs (Acetaminophen) .... As Needed 11)  Klor-Con 20 Meq Pack (Potassium Chloride) .... Take One Tablet By Mouth Daily  Allergies (verified): No Known Drug Allergies  Anticoagulation Management History:      The patient is taking warfarin and comes in today for a routine follow up visit.  Positive risk factors for bleeding include an age of 75 years or older, history of CVA/TIA, and presence of serious comorbidities.  The bleeding index is 'high risk'.  Positive CHADS2 values include History of HTN, Age > 58 years old, History of Diabetes, and Prior Stroke/CVA/TIA.  The start date was 11/14/2008.   Anticoagulation responsible provider: Dietrich Pates MD, Molly Maduro.  INR POC: 2.2.  Cuvette Lot#: [id number].  Exp: 11-2010.    Anticoagulation Management Assessment/Plan:      The patient's current anticoagulation dose is Warfarin sodium 5 mg tabs: as directed.  The target INR is 2 - 3.  The next INR is due 12/14/2009.  Anticoagulation instructions were given to patient.  Results were reviewed/authorized by Teressa Lower, RN.  She was notified by Teressa Lower RN.         Prior Anticoagulation Instructions: INR 2.2 Continue coumadin 5mg  once daily except 7.5mg  on Tuesdays  Current Anticoagulation Instructions: INR 2.2 TODAY NO CHANGE IN CURRENT WARFARIN DOSING

## 2010-08-17 NOTE — Medication Information (Signed)
Summary: ccr-lr  Anticoagulant Therapy  Managed by: Vashti Hey, RN Referring MD: [place] Bing PCP: Dr. Avon Gully Supervising MD: Dietrich Pates MD, Molly Maduro Indication 1: Atrial Fibrillation (ICD-427.31) Lab Used: [facility] [place] Site: [place] INR POC 2.6  Dietary changes: no    Health status changes: no    Bleeding/hemorrhagic complications: no    Recent/future hospitalizations: no    Any changes in medication regimen? no    Recent/future dental: no  Any missed doses?: no       Is patient compliant with meds? yes       Allergies: No Known Drug Allergies  Anticoagulation Management History:      The patient is taking warfarin and comes in today for a routine follow up visit.  Positive risk factors for bleeding include an age of 45 years or older, history of CVA/TIA, and presence of serious comorbidities.  The bleeding index is 'high risk'.  Positive CHADS2 values include History of HTN, Age > 13 years old, History of Diabetes, and Prior Stroke/CVA/TIA.  The start date was 11/14/2008.  Anticoagulation responsible provider: Dietrich Pates MD, Molly Maduro.  INR POC: 2.6.  Cuvette Lot#: [id number].  Exp: 11-2010.    Anticoagulation Management Assessment/Plan:      The patient's current anticoagulation dose is Warfarin sodium 5 mg tabs: as directed.  The target INR is 2 - 3.  The next INR is due 05/20/2010.  Anticoagulation instructions were given to patient.  Results were reviewed/authorized by Vashti Hey, RN.  She was notified by Vashti Hey RN.         Prior Anticoagulation Instructions: INR 3.3 Hold coumadin tonight then resume 5mg  once daily except 7.5mg  on Tuesdays  Current Anticoagulation Instructions: INR 2.6 Continue coumadin 5mg  once daily except 7.5mg  on Tuesdays

## 2010-08-17 NOTE — Medication Information (Signed)
Summary: ccr  Anticoagulant Therapy  Managed by: Vashti Hey, RN Referring MD: [place] Bing PCP: Dr. Avon Gully Supervising MD: Dietrich Pates MD, Molly Maduro Indication 1: Atrial Fibrillation (ICD-427.31) Lab Used: [facility] [place] Site: [place] INR POC 2.4  Dietary changes: no    Health status changes: no    Bleeding/hemorrhagic complications: no    Recent/future hospitalizations: no    Any changes in medication regimen? no    Recent/future dental: no  Any missed doses?: no       Is patient compliant with meds? yes       Allergies: No Known Drug Allergies  Anticoagulation Management History:      The patient is taking warfarin and comes in today for a routine follow up visit.  Positive risk factors for bleeding include an age of 76 years or older, history of CVA/TIA, and presence of serious comorbidities.  The bleeding index is 'high risk'.  Positive CHADS2 values include History of HTN, Age > 78 years old, History of Diabetes, and Prior Stroke/CVA/TIA.  The start date was 11/14/2008.  Anticoagulation responsible provider: Dietrich Pates MD, Molly Maduro.  INR POC: 2.4.  Cuvette Lot#: [id number].  Exp: 11-2010.    Anticoagulation Management Assessment/Plan:      The patient's current anticoagulation dose is Warfarin sodium 5 mg tabs: as directed.  The target INR is 2 - 3.  The next INR is due 01/14/2010.  Anticoagulation instructions were given to patient.  Results were reviewed/authorized by Vashti Hey, RN.  She was notified by Vashti Hey RN.         Prior Anticoagulation Instructions: INR 2.2 TODAY NO CHANGE IN CURRENT WARFARIN DOSING  Current Anticoagulation Instructions: INR 2.4 Continue coumadin 5mg  once daily except 7.5mg  on Tuesdays

## 2010-08-17 NOTE — Assessment & Plan Note (Signed)
Summary: 9 mth f/u per checkout on 01/05/09/tg   Referring Provider:  Oncology-Dr. Mariel Sleet Primary Provider:  Dr. Avon Gully   History of Present Illness: [name] returns to the office for continued assessment and treatment of what was originally paroxysmal and is now constant atrial fibrillation requiring anticoagulation.  Since her last visit, she has done extremely well.  She remains active without any cardiopulmonary symptoms.  She notes no palpitations.  She has experienced no lightheadedness and certainly no loss of consciousness.  Control of hypertension and diabetes is currently good.  INR has been stable and therapeutic.  Current Medications (verified): 1)  Simvastatin 20 Mg Tabs (Simvastatin) .Marland Kitchen.. 1 Tab Once Daily 2)  Amlodipine Besylate 10 Mg Tabs (Amlodipine Besylate) .Marland Kitchen.. 1 Tab Once Daily 3)  Glipizide 2.5 Mg Xr24h-Tab (Glipizide) .Marland Kitchen.. 1 Tab Once Daily 4)  Metformin Hcl 500 Mg Xr24h-Tab (Metformin Hcl) .Marland Kitchen.. 1 Tab Once Two Times A Day 5)  Hydrochlorothiazide 25 Mg Tabs (Hydrochlorothiazide) .... Take 1/2 Tab Once Daily 6)  Omeprazole 20 Mg Cpdr (Omeprazole) .Marland Kitchen.. 1 Tab Once Daily 7)  Metoprolol Tartrate 50 Mg Tabs (Metoprolol Tartrate) .Marland Kitchen.. 1 Tab Two Times A Day 8)  Warfarin Sodium 5 Mg Tabs (Warfarin Sodium) .... As Directed 9)  Ferrous Sulfate 325 (65 Fe) Mg Tabs (Ferrous Sulfate) .Marland Kitchen.. 1tab Once Daily 10)  Tylenol Extra Strength 500 Mg Tabs (Acetaminophen) .... As Needed 11)  Klor-Con 20 Meq Pack (Potassium Chloride) .... Take One Tablet By Mouth Daily  Allergies (verified): No Known Drug Allergies  Past History:  PMH, FH, and Social History reviewed and updated.  Past Medical History: Paroxysmal-->Constant atrial fibrillation requiring anticoagulation; normal EF Diabetes mellitus Hypertension Hyperlipidemia Cerebrovascular dz: Left cerebral CVA 2007; residual right-sided weakness; 7/09-mild plaque; no focal stenosis Hyperkalemia Colon adenocarcinoma  diagnosed 2009; status post right hemicolectomy-2009 Chronic kidney disease: Creatinine of 1.5 in 10/2008 Dupuytren's contracture  EKG  Procedure date:  11/17/2009  Findings:      Rhythm Strip  Atrial fibrillation Controlled ventricular rate-84 bpm   Review of Systems       See history of present illness.  Vital Signs:  Patient profile:   77 year old female Height:      63 inches Weight:      155 pounds BMI:     27.56 O2 Sat:      97 % Pulse rate:   87 / minute BP sitting:   120 / 68  (left arm)  Vitals Entered By: Larita Fife Via LPN (Nov 17, 1608 2:31 PM)  Physical Exam  General:    Proportionate weight and height; well developed; no acute distress:   Neck-No JVD; no carotid bruits: Lungs-No tachypnea, no rales; no rhonchi; no wheezes: Cardiovascular-normal PMI; normal S1 and S2; modest systolic ejection murmur at the cardiac base and left sternal border Abdomen-BS normal; soft and non-tender without masses or organomegaly:  Musculoskeletal-No deformities, no cyanosis or clubbing: Neurologic-Normal cranial nerves; symmetric strength and tone:  Skin-Warm, no significant lesions: Extremities-1+ distal pulses; no edema:     Impression & Recommendations:  Problem # 1:  ATRIAL FIBRILLATION (ICD-427.31) Arrhythmias producing no apparent symptoms.  Heart rate control is good as is control of anticoagulation.  Due to chronic use of warfarin, stool for Hemoccult testing and a CBC will be obtained.  Records of laboratory testing performed by Dr. Felecia Shelling will be obtained and reviewed.  INR will be checked today, and Coumadin dose adjusted.  Problem # 2:  HYPERTENSION, BENIGN (ICD-401.1)  Blood pressure control is excellent; current medications will be continued.  Problem # 3:  HYPERLIPIDEMIA (ICD-272.4)  Lipid profile was excellent 4 months ago, and need not be repeated for at least a year.  Problem # 4:  HYPERKALEMIA (ICD-276.7) Borderline elevated potassium noted previously  has not recurred with adjustment of her medication and diet.  I will plan to see this nice woman again in one year.  Warfarin dosage will be managed in our Anticoagulation Clinic.  Other Orders: T-CBC w/Diff ([id number]) Hemoccult Cards (Take Home) (Hemoccult Cards)  Patient Instructions: 1)  Your physician recommends that you schedule a follow-up appointment in: 1 year 2)  Your physician recommends that you return for lab work in: today 3)  Your physician has asked that you test your stool for blood. It is necessary to test 3 different stool specimens for accuracy. You will be given 3 hemoccult cards for specimen collection. For each stool specimen, place a small portion of stool sample (from 2 different areas of the stool) into the 2 squares on the card. Close card. Repeat with 2 more stool specimens. Bring the cards back to the office for testing.

## 2010-08-19 NOTE — Medication Information (Signed)
Summary: ccr-lr  Anticoagulant Therapy  Managed by: Vashti Hey, RN Referring MD: [place] Bing PCP: Dr. Avon Gully Supervising MD: Dietrich Pates MD, Molly Maduro Indication 1: Atrial Fibrillation (ICD-427.31) Lab Used: [facility] [place] Site: [place] INR POC 2.5  Dietary changes: no    Health status changes: no    Bleeding/hemorrhagic complications: no    Recent/future hospitalizations: no    Any changes in medication regimen? no    Recent/future dental: no  Any missed doses?: no       Is patient compliant with meds? yes       Allergies: No Known Drug Allergies  Anticoagulation Management History:      The patient is taking warfarin and comes in today for a routine follow up visit.  Positive risk factors for bleeding include an age of 75 years or older, history of CVA/TIA, and presence of serious comorbidities.  The bleeding index is 'high risk'.  Positive CHADS2 values include History of HTN, Age > 75 years old, History of Diabetes, and Prior Stroke/CVA/TIA.  The start date was 11/14/2008.  Anticoagulation responsible provider: Dietrich Pates MD, Molly Maduro.  INR POC: 2.5.  Cuvette Lot#: [id number].  Exp: 11-2010.    Anticoagulation Management Assessment/Plan:      The patient's current anticoagulation dose is Warfarin sodium 5 mg tabs: as directed.  The target INR is 2 - 3.  The next INR is due 08/25/2010.  Anticoagulation instructions were given to patient.  Results were reviewed/authorized by Vashti Hey, RN.  She was notified by Vashti Hey RN.         Prior Anticoagulation Instructions: INR 2.9 Continue coumadin 5mg  once daily except 7.5mg  on Tuesdays  Current Anticoagulation Instructions: INR 2.5 Continue coumadin 5mg  once daily except 7.5mg  on Tuesdays

## 2010-08-25 ENCOUNTER — Encounter (INDEPENDENT_AMBULATORY_CARE_PROVIDER_SITE_OTHER): Payer: MEDICARE

## 2010-08-25 ENCOUNTER — Encounter: Payer: Self-pay | Admitting: Cardiology

## 2010-08-25 DIAGNOSIS — I4891 Unspecified atrial fibrillation: Secondary | ICD-10-CM

## 2010-08-25 DIAGNOSIS — Z7901 Long term (current) use of anticoagulants: Secondary | ICD-10-CM

## 2010-09-02 NOTE — Medication Information (Signed)
Summary: ccr-lr [place]  Anticoagulant Therapy Managed by: Vashti Hey, RN Patient Assessment Part 2:  Have you MISSED ANY DOSES or CHANGED TABLETS?  0  Have you had any BRUISING or BLEEDING ( nose or gum bleeds,blood in urine or stool)?  Have you STARTED or STOPPED any MEDICATIONS, including OTC meds,herbals or supplements?  Have you CHANGED your DIET, especially green vegetables,or ALCOHOL intake?  Have you had any ILLNESSES or HOSPITALIZATIONS?  Have you had any signs of CLOTTING?(chest discomfort,dizziness,shortness of breath,arms tingling,slurred speech,swelling or redness in leg)       Regimen Out:    Total Weekly: 37.50 mg mg  Next INR Due: 09/22/2010      Allergies: No Known Drug Allergies  Anticoagulant Therapy  Managed by: Vashti Hey, RN Referring MD: [place] Bing PCP: Dr. Avon Gully Supervising MD: Dietrich Pates MD, Molly Maduro Indication 1: Atrial Fibrillation (ICD-427.31) Lab Used: [facility] [place] Site: [place] INR POC 2.5  Dietary changes: no    Health status changes: no    Bleeding/hemorrhagic complications: no    Recent/future hospitalizations: no    Any changes in medication regimen? no    Recent/future dental: no  Any missed doses?: no       Is patient compliant with meds? yes         Anticoagulation Management History:      The patient is taking warfarin and comes in today for a routine follow up visit.  Positive risk factors for bleeding include an age of 75 years or older, history of CVA/TIA, and presence of serious comorbidities.  The bleeding index is 'high risk'.  Positive CHADS2 values include History of HTN, Age > 75 years old, History of Diabetes, and Prior Stroke/CVA/TIA.  The start date was 11/14/2008.  Anticoagulation responsible provider: Dietrich Pates MD, Molly Maduro.  INR POC: 2.5.  Cuvette Lot#: [id number].  Exp: 11-2010.    Anticoagulation Management Assessment/Plan:      The patient's current  anticoagulation dose is Warfarin sodium 5 mg tabs: as directed.  The target INR is 2 - 3.  The next INR is due 09/22/2010.  Anticoagulation instructions were given to patient.  Results were reviewed/authorized by Vashti Hey, RN.  She was notified by Vashti Hey RN.         Prior Anticoagulation Instructions: INR 2.5 Continue coumadin 5mg  once daily except 7.5mg  on Tuesdays  Current Anticoagulation Instructions: Same as Prior Instructions.

## 2010-09-22 ENCOUNTER — Encounter (INDEPENDENT_AMBULATORY_CARE_PROVIDER_SITE_OTHER): Payer: MEDICARE

## 2010-09-22 ENCOUNTER — Encounter: Payer: Self-pay | Admitting: Cardiovascular Disease

## 2010-09-22 DIAGNOSIS — I4891 Unspecified atrial fibrillation: Secondary | ICD-10-CM

## 2010-09-22 DIAGNOSIS — Z7901 Long term (current) use of anticoagulants: Secondary | ICD-10-CM

## 2010-09-28 NOTE — Medication Information (Signed)
Summary: ccr-lr  Anticoagulant Therapy  Managed by: Vashti Hey, RN Referring MD: [place] Bing PCP: Dr. Avon Gully Supervising MD: [place] Emms MD, Theron Arista Indication 1: Atrial Fibrillation (ICD-427.31) Lab Used: [facility] [place] Site: [place] INR POC 3.5  Dietary changes: no    Health status changes: no    Bleeding/hemorrhagic complications: no    Recent/future hospitalizations: no    Any changes in medication regimen? no    Recent/future dental: no  Any missed doses?: no       Is patient compliant with meds? yes       Allergies: No Known Drug Allergies  Anticoagulation Management History:      The patient is taking warfarin and comes in today for a routine follow up visit.  Positive risk factors for bleeding include an age of 75 years or older, history of CVA/TIA, and presence of serious comorbidities.  The bleeding index is 'high risk'.  Positive CHADS2 values include History of HTN, Age > 83 years old, History of Diabetes, and Prior Stroke/CVA/TIA.  The start date was 11/14/2008.  Anticoagulation responsible provider: Eden Emms MD, Theron Arista.  INR POC: 3.5.  Cuvette Lot#: [id number].  Exp: 11-2010.    Anticoagulation Management Assessment/Plan:      The patient's current anticoagulation dose is Warfarin sodium 5 mg tabs: as directed.  The target INR is 2 - 3.  The next INR is due 10/20/2010.  Anticoagulation instructions were given to patient.  Results were reviewed/authorized by Vashti Hey, RN.  She was notified by Vashti Hey RN.         Prior Anticoagulation Instructions: INR 2.5 Continue coumadin 5mg  once daily except 7.5mg  on Tuesdays  Current Anticoagulation Instructions: INR 3.5 Hold coumadin tonight then resume 5mg  once daily except 7.5mg  on Tuesdays

## 2010-10-05 LAB — CEA: CEA: 4.2 ng/mL (ref 0.0–5.0)

## 2010-10-13 ENCOUNTER — Encounter: Payer: Self-pay | Admitting: Cardiology

## 2010-10-13 DIAGNOSIS — I4891 Unspecified atrial fibrillation: Secondary | ICD-10-CM

## 2010-10-13 DIAGNOSIS — Z7901 Long term (current) use of anticoagulants: Secondary | ICD-10-CM

## 2010-10-20 ENCOUNTER — Ambulatory Visit (INDEPENDENT_AMBULATORY_CARE_PROVIDER_SITE_OTHER): Payer: MEDICARE | Admitting: *Deleted

## 2010-10-20 DIAGNOSIS — I4891 Unspecified atrial fibrillation: Secondary | ICD-10-CM

## 2010-10-20 DIAGNOSIS — Z7901 Long term (current) use of anticoagulants: Secondary | ICD-10-CM

## 2010-10-21 LAB — CEA: CEA: 2.5 ng/mL (ref 0.0–5.0)

## 2010-10-22 LAB — CEA: CEA: 3.2 ng/mL (ref 0.0–5.0)

## 2010-10-23 LAB — GLUCOSE, CAPILLARY: Glucose-Capillary: 99 mg/dL (ref 70–99)

## 2010-10-25 LAB — CEA: CEA: 2.5 ng/mL (ref 0.0–5.0)

## 2010-10-28 LAB — CEA: CEA: 2.8 ng/mL (ref 0.0–5.0)

## 2010-11-18 ENCOUNTER — Encounter: Payer: MEDICARE | Admitting: *Deleted

## 2010-11-18 ENCOUNTER — Encounter: Payer: Self-pay | Admitting: *Deleted

## 2010-11-25 ENCOUNTER — Ambulatory Visit (INDEPENDENT_AMBULATORY_CARE_PROVIDER_SITE_OTHER): Payer: MEDICARE | Admitting: *Deleted

## 2010-11-25 DIAGNOSIS — Z7901 Long term (current) use of anticoagulants: Secondary | ICD-10-CM

## 2010-11-25 DIAGNOSIS — I4891 Unspecified atrial fibrillation: Secondary | ICD-10-CM

## 2010-11-25 LAB — POCT INR: INR: 3.9

## 2010-11-30 NOTE — Consult Note (Signed)
NAMEZIRA, HELINSKI                  ACCOUNT NO.:  [id number]   MEDICAL RECORD NO.:  [id number]          PATIENT TYPE:  INP   LOCATION:  A321                          FACILITY:  [facility]   PHYSICIAN:  Kassie Mends, M.D.      DATE OF BIRTH:  [birth year]   DATE OF CONSULTATION:  DATE OF DISCHARGE:                                 CONSULTATION   REFERRING PHYSICIAN:  Tesfaye D. Felecia Shelling, MD   REASON FOR CONSULTATION:  Anemia.   HISTORY OF PRESENT ILLNESS:  Ms. Riedesel is a 77 year old female who has a  significant past medical history of CVA in 2007.  She is maintained on  Aggrenox.  She has had a significant neurologic improvement.  She was  seen in clinic, today, and was noted to have a hemoglobin of 7.3 with a  platelet count of 357.  She denies any hematemesis, black tarry stools,  abdominal pain, problems swallowing, nausea, vomiting, or diarrhea.  She  has had years of constipation.  She does complain of significant  heartburn and indigestion since having her stroke in 2007.  She is not  maintained on a PPI as an outpatient.   PAST MEDICAL HISTORY:  1. Gastroesophageal reflux disease.  2. Diabetes.  3. Hypertension.  4. Hyperlipidemia.   PAST SURGICAL HISTORY:  Tubal ligation.   ALLERGIES:  No known drug allergies.   MEDICATIONS:  1. Zocor.  2. Glipizide.  3. Metformin.  4. Verapamil.  5. HCTZ.   FAMILY HISTORY:  She denies any family history of colon cancer or colon  polyps.   SOCIAL HISTORY:  She is married and dips snuff.  She denies any  cigarette use or alcohol use.  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   REVIEW OF SYSTEMS:  She lost from 157 to 143 pounds over the last 3  months.  Her weight loss was not intentional.  She reports vomiting once  twice, but none in the last month.  Her review of systems as per the  HPI, otherwise all other systems are negative.   PHYSICAL EXAM:  VITAL SIGNS:  Temperature 97.5, blood pressure 132/56,  pulse 67, respiratory  rate 20.  GENERAL:  She is no apparent distress, alert and orient x4.  HEENT:  Atraumatic normocephalic.  Pupils equal, react to light.  Mouth no oral  lesions.  Posterior pharynx without erythema or exudate.  NECK:  Full  range of motion.  No lymphadenopathy.  LUNGS:  Clear to auscultation bilaterally.CARDIOVASCULAR EXAM:  Regular  rhythm, no murmur.  Normal S1 and S2.  ABDOMEN:  Bowel sounds are present, soft, nontender, nondistended with  no rebound or guarding.  EXTREMITIES:  Have no cyanosis or edema.  NEURO:  She has no gross focal neurologic deficits.   LABS:  Per the HPI.   ASSESSMENT:  Ms. Summerson is a 77 year old female who presents with anemia  on Aggrenox with a history of CVA.  She has no evidence of active  bleeding.  The differential diagnosis includes colon polyp or AVM.  I  doubt that she  has peptic ulcer disease or colon cancer but it remains  in the differential diagnosis.  Thank you for allowing me to see Ms.  Carrier in consultation, my recommendations follow.   RECOMMENDATIONS:  1. Check anemia profile prior to transfusion.  If no source for her      anemia can be identified, and she is iron deficient, then she will      require a capsule endoscopy.  2. Clear liquid diet and then n.p.o. after midnight except from meds.  3. Will hold glipizide on January 08, 2008.  She may continue her      metformin.  4. Will schedule colonoscopy followed by an EGD on 06/23 with Dr.      Jena Gauss.  5. Bedside commode.  6. Begin bowel prep now.      Kassie Mends, M.D.  Electronically Signed     SM/MEDQ  D:  01/07/2008  T:  01/07/2008  Job:  [id number]   cc:   Tesfaye D. Felecia Shelling, MD  Fax: [phone]

## 2010-11-30 NOTE — Discharge Summary (Signed)
NAME:  Katrina Arnold, [name]                  ACCOUNT NO.:  [id number]   MEDICAL RECORD NO.:  [id number]          PATIENT TYPE:  INP   LOCATION:  IC10                          FACILITY:  [facility]   PHYSICIAN:  Tesfaye D. Felecia Shelling, MD   DATE OF BIRTH:  1932/08/05   DATE OF ADMISSION:  01/07/2008  DATE OF DISCHARGE:  07/04/2009LH                               DISCHARGE SUMMARY   DISCHARGE DIAGNOSES:  1. Cancer of the colon.  2. Anemia secondary to rectal bleeding.  3. Hypertension.  4. Diabetes mellitus.  5. Status post cerebrovascular accident.  6. Hyperlipidemia.  7. Status post right hemicolectomy.  8. Paroxysmal atrial fibrillation.  9. Hypokalemia.   DISCHARGE MEDICATIONS:  1. Lisinopril 20 mg daily.  2. HCTZ 25 mg daily.  3. Glipizide 12.5 mg daily.  4. Zocor 20 mg daily.  5. Metformin 500 mg b.i.d.  6. Protonix 40 mg daily.  7. Norvasc 5 mg daily.  8. Lopressor 50 grams b.i.d.  9. Amiodarone 400 mg b.i.d.  10.Ferrous sulfate 325 mg daily.   DISPOSITION:  The patient was discharged home in stable condition.   HOSPITAL COURSE:  This is a 77 year old female patient who was admitted  from the office due to low hemoglobin and hematocrit.  The patient was  seen due to generalized weakness and syncopal episode.  She had positive  stool guaiac.  Further workup showed that the patient has colon mass.  A  surgical consult was done and she  underwent left hemicolectomy.  Postoperatively, the patient had episode  of atrial fibrillation for which she was seen by cardiology and  amiodarone was started.  The patient improved.  She was discharge home  in stable condition to continue followup in outpatient.      Tesfaye D. Felecia Shelling, MD  Electronically Signed     TDF/MEDQ  D:  03/04/2008  T:  03/05/2008  Job:  [id number]

## 2010-11-30 NOTE — Assessment & Plan Note (Signed)
[facility]                       [facility] OFFICE NOTE   NAME:Katrina Arnold, Katrina Arnold                         MRN:          [id number]  DATE:01/30/2008                            DOB:          14-Oct-1932    PRIMARY CARE PHYSICIAN:  Tesfaye D. Felecia Shelling, MD   REASON FOR VISIT:  Post-hospitalization followup.   HISTORY OF PRESENT ILLNESS:  Katrina Arnold is a 77 year old female patient  who has a history of hypertension, diabetes mellitus, and stroke in 2007  who was admitted to [facility] with syncope secondary to  profound anemia.  She was noted to have a colon mass and underwent right  hemicolectomy on January 11, 2008.  She developed postoperative atrial  fibrillation.  She was controlled initially with calcium channel blocker  and beta-blocker therapy.  She continued to develop recurrent AFib and  was eventually placed on amiodarone.  This controlled her rhythm.  It  was felt in the hospital that she could be on Coumadin at least in the  short-term as long as it was okay with surgery.  The patient was  discharged on amiodarone.  She was not discharged on Coumadin or  aspirin.  She returns to the office today for followup.  She did see Dr.  Mariel Sleet in consultation.  She was felt to have stage IIA cancer of the  ascending colon, adenocarcinoma type.  She did not require chemotherapy  or radiation therapy.   In the office today, she notes that she is doing well.  She feels much  better since discharge from the hospital.  She denies chest pain or  shortness of breath.  She denies palpitations.  She denies syncope or  near syncope.  She denies orthopnea, PND, or pedal edema.   CURRENT MEDICATIONS:  1. Iron daily.  2. Lisinopril 20 mg b.i.d.  3. Simvastatin 20 mg daily.  4. Amlodipine 10 mg daily.  5. Amiodarone 400 mg b.i.d  6. Glipizide 2.5 mg daily.  7. Metformin 500 mg b.i.d.  8. HCTZ 25 mg daily.  9. Protonix 40 mg daily.  10.Metoprolol  50 mg half tablet b.i.d.   PHYSICAL EXAMINATION:  GENERAL:  She is a well-nourished and well-  developed female.  VITAL SIGNS:  Blood pressure is 148/60, weight 138, and pulse 45.  HEENT:  Normal.  NECK:  Without JVD.  CARDIAC:  Normal S1 and S2.  Regular rate and rhythm.  LUNGS:  Clear to auscultation bilaterally.  ABDOMEN:  Soft and nontender.  EXTREMITIES:  Without edema.  NEUROLOGIC:  She is alert and oriented x3.  Cranial nerves II-X11 are  grossly intact.   Electrocardiogram reveals marked sinus bradycardia with heart rate of  45, left axis deviation, and no acute changes.   IMPRESSION:  1. Paroxysmal atrial fibrillation.      a.     CHADS2 score of 4.      b.     Arrhythmia in the setting of recent colon surgery.  2. Stage II adenocarcinoma of the colon, status post right      hemicolectomy in June 2009.  3. Status post cerebrovascular accident in 2007.  4. Diabetes mellitus.  5. Hypertension.  6. Hyperlipidemia  7. History of bilateral carotid artery bruits.  8. Sinus bradycardia.   PLAN:  1. Katrina Arnold returns for followup.  She remains in sinus rhythm.  She      is quite bradycardic today.  She is asymptomatic with this.  She is      on a fairly high dose of amiodarone at this point in time and is      also on metoprolol.  We will decrease her metoprolol further to      12.5 mg b.i.d.  Her amiodarone will also be decreased to 400 mg a      day.  2. The patient can decrease her amiodarone to 200 mg a day after 4      weeks of the above therapy.  3. She will have a blood pressure check and rhythm, strip done with a      nurse in 1 week to assess her rate.  4. I discussed the case further with Dr. Dietrich Pates.  After further      review, we do not feel that she requires Coumadin therapy at this      point in time.  She does, however, require aspirin therapy.  She      will be started back on aspirin 325 mg daily.  She will likely be      able to come off of amiodarone  therapy in the next several weeks as      long as she remains in sinus rhythm.  After this is discontinued,      she will likely need an event monitor to document continued sinus      rhythm.  5. Amlodipine will be increased to 10 mg a day to better control her      blood pressure.  6. We will check LFTs and a TSH prior to her next office visit in 6      weeks with Dr. Dietrich Pates to follow up on amiodarone surveillance.  7. She had carotid bruits in the hospital.  We will make sure that she      has followup carotid      Dopplers to assess for significant internal carotid artery      stenosis.  8. As noted above, the patient will follow up Dr. Dietrich Pates in the next      6 weeks or sooner p.r.n.      Tereso Newcomer, PA-C  Electronically Signed      Gerrit Friends. Dietrich Pates, MD, [facility]  Electronically Signed   SW/MedQ  DD: 01/30/2008  DT: 01/31/2008  Job #: [id number]   cc:   Tesfaye D. Felecia Shelling, MD  Fabio Bering, MD  Ladona Horns. Mariel Sleet, MD

## 2010-11-30 NOTE — H&P (Signed)
NAME:  Katrina Arnold, Katrina Arnold                  ACCOUNT NO.:  [id number]   MEDICAL RECORD NO.:  [id number]          PATIENT TYPE:  INP   LOCATION:  A321                          FACILITY:  [facility]   PHYSICIAN:  Tesfaye D. Felecia Shelling, MD   DATE OF BIRTH:  [birth year]   DATE OF ADMISSION:  01/07/2008  DATE OF DISCHARGE:  LH                              HISTORY & PHYSICAL   CHIEF COMPLAINT:  Low hemoglobin with positive occult blood in stool.   HISTORY OF PRESENT ILLNESS:  This is a 77 year old female patient with  history of multiple medical illnesses who was seen in the office on January 04, 2008, due to complaint of syncopal episode.  The patient claims she  passed out while she was trying to get some water from fridge.  She fell  on the floor and stayed for few minutes.  Later, the patient waked up  and went back to sleep.  During the episode, the patient had no chest  pain, shortness of breath, headache, or seizure activity.  The patient  came to the office on January 04, 2008, for evaluation.  The patient was  seen in the office and baseline labs as well as CT scan of the head was  ordered.  Her CT scan was negative.  However,  her hemoglobin was 8.3  with a hematocrit of 25.  Her CBC was repeated today and showed  hemoglobin of 7.3 with hematocrit of 20.  Her stool occult blood that  was done in the office was positive.  The patient has no history of  nausea, vomiting, hematemesis, or melena.  She was then admitted  directly for blood transfusion and a GI evaluation.   REVIEW OF SYSTEMS:  No headache, fever, chills, cough, chest pain,  nausea, vomiting, abdominal pain, dysuria, urgency, or frequency of  urination.   PAST MEDICAL HISTORY:  1. Hypertension.  2. Diabetes mellitus.  3. Status post CVA.  4. Hyperlipidemia.   CURRENT MEDICATIONS:  1. Glyburide 2.5 mg daily.  2. Lisinopril 20 mg daily.  3. Aggrenox 200/25mg , 1 tablet b.i.d.  4. Verapamil 180 mg b.i.d.  5. Hydrochlorothiazide 25 mg  daily.  6. Zocor 20 mg daily.  7. Metformin 500 mg b.i.d.   SOCIAL HISTORY:  The patient is married.  She lives with her husband.  No history of alcohol, tobacco, or substance abuse.   PHYSICAL EXAMINATION:  GENERAL:  The patient is alert, awake, and  chronically sick looking  VITAL SIGNS:  Blood pressure 120/80, pulse 92, respiratory rate 18,  temperature 98 degrees Fahrenheit.  HEENT:  Pupils are equal and reactive.  NECK:  Supple.  CHEST:  Decreased air entry bilateral rhonchi.  CARDIOVASCULAR SYSTEM:  First and second heart sound heard.  No murmur.  No gallop.  ABDOMEN:  Soft and lax.  Bowel sound is positive.  No mass or  organomegaly.  EXTREMITIES:  No leg edema.   ASSESSMENT:  1. Anemia with positive occult blood, probably secondary to      gastrointestinal bleed.  2. Hypertension.  3. Diabetes mellitus.  4.  Status post cerebrovascular accident.  5. Hyperlipidemia.   PLAN:  We will admit the patient and type and crossmatch and transfuse 2  units of red blood cells.  We will hold Aggrenox.  We will start the  patient on Protonix.  We will do GI consult.  We will continue to  monitor her CBC.  Continue the rest of her regular medications.      Tesfaye D. Felecia Shelling, MD  Electronically Signed     TDF/MEDQ  D:  01/07/2008  T:  01/08/2008  Job:  [id number]

## 2010-11-30 NOTE — Consult Note (Signed)
[name], [name] NO.:  Arnold   MEDICAL RECORD NO.:  [id number]          PATIENT TYPE:  INP   LOCATION:  IC10                          FACILITY:  [facility]   PHYSICIAN:  Pricilla Riffle, MD, FACCDATE OF BIRTH:  1932/11/13   DATE OF CONSULTATION:  01/14/2008  DATE OF DISCHARGE:                                 CONSULTATION   CARDIOLOGIST:  Pricilla Riffle, MD, [facility]   REFERRING PHYSICIAN:  Tesfaye D. Felecia Shelling, MD   REASON FOR CONSULTATION:  Atrial fibrillation.   HISTORY OF PRESENT ILLNESS:  Ms. Granato is a 77 year old female patient,  who was admitted on January 07, 2008, after being evaluated for a syncopal  episode.  She was noted to be quite anemic and she was also noted to  have heme-positive stools.  She was admitted to the hospital where she  received 2 units of packed red blood cells.  She was seen by  Gastroenterology, who performed a colonoscopy.  She was diagnosed with a  colon mass and underwent a right hemicolectomy on January 11, 2008, with  colon cancer.  Perioperatively, the patient did develop paroxysmal  atrial fibrillation.  She has been treated with IV Lopressor as well as  IV diltiazem.  She has been transitioned over to p.o. diltiazem.  Her  heart rates will go up into 120s to 140s at times.  When she converts  back to normal sinus rhythm, she remains in the 50s to 60s.  She has had  some post termination pauses, the longest of which was about 3-1/2  seconds.  She has had a couple of runs of nonsustained ventricular  tachycardia of about 6 beats.  She has been fairly asymptomatic with her  atrial fibrillation.  She denies any tachy palpitations at home.  She  had noted an audible pulse recently.  She did feel lightheaded with this  at home.  She knows that this has completely resolved since she received  blood transfusions.  She denies any recent history of chest discomfort  with exertion.  She denies any significant dyspnea with exertion,  orthopnea,  or PND.  Aside from her syncopal episode just prior to  presentation, she denies any other history of syncope.  She does not  have bilateral lower extremities pain with walking.  We were asked to  further evaluate for her paroxysmal atrial fibrillation.   PAST MEDICAL HISTORY:  1. Hypertension.  2. Diabetes mellitus.  3. History of left brain CVA in 2007.  4. History of bilateral tubal ligation.  5. Hyperlipidemia.   MEDICATIONS PRIOR TO ADMISSION:  1. Lisinopril 20 mg.  2. HCTZ 25 mg daily.  3. Glipizide ER 2.5 mg daily.  4. Zocor 20 mg daily.  5. Verapamil 180 mg b.i.d.  6. Metformin 500 mg b.i.d.  7. Aggrenox 200/25 mg b.i.d.   CURRENT MEDICATIONS:  1. Metformin 500 mg b.i.d..  2. Protonix 40 mg IV daily.  3. Lopressor 5 mg IV q.6 h.  4. Morphine PCA pump.  5. Lovenox 40 mg subcu daily.  6. Sliding scale insulin.  7. Diltiazem 60 mg p.o. q.6 h.   ALLERGIES:  No known drug allergies.   SOCIAL HISTORY:  The patient lives in [place] with her husband.  She  has 8 living children out of 10.  She denies any cigarette abuse, but  she had dip snuff in the past.  She denies alcohol abuse.  Her past  postoperative complications are diabetes mellitus.   REVIEW OF SYSTEMS:  Please see HPI.  She denies any dysuria, hematuria,  bright red blood per rectum or melena, skin changes, fever, chills,  headache, or rash.  She has noted a nonproductive cough recently.  Rest  of the review of systems were negative.   PHYSICAL EXAM:  GENERAL:  She is a well-nourished, well-developed  female.  VITAL SIGNS:  Blood pressure 121/80 up to 173/61, pulse 53, respirations  14, temperature 98.7, and oxygen saturation is 95% on room air.  In's  and Out's; 2502 in, 2600 out.  HEENT:  Normal.  NECK:  Without JVD.  LYMPH:  Without lymphadenopathy.  ENDOCRINE:  Without thyromegaly.  CARDIAC:  Normal S1 and S2.  Regular, rate, and rhythm.  No murmurs.  LUNGS:  Clear to auscultation bilaterally and  anteriorly.  SKIN:  Without rash.  ABDOMEN:  Soft, nontender with normal active bowel sounds.  EXTREMITIES:  Without clubbing, cyanosis, or edema.  MUSCULOSKELETAL:  Without joint deformity.  NEUROLOGIC:  She is alert and oriented x3.  Cranial nerves II through  XII grossly intact.  VASCULAR:  Dorsalis pedis and posterior tibial pulses were difficult to  palpate bilaterally.  She does have positive bilateral carotid artery  bruits.   Chest x-ray from January 09, 2008, no acute cardiopulmonary disease,  scattered abdominal air-fluid levels, which were nonspecific.  Her  initial electrocardiogram reveled normal sinus rhythm, heart rate of 71,  no ischemic changes.  Her EKG from January 11, 2008, revealed atrial  fibrillation with a heart rate of 123, no acute changes.  Head CT from  January 14, 2008, no acute changes, old left CVA.   LABS:  White count 10,900, hemoglobin 9.7, hematocrit 29.5, and platelet  count 271,000.  Sodium 156, potassium 3.5, BUN 3, creatinine 0.8,  glucose 145, albumin 2.8, and magnesium 1.6.   IMPRESSION:  1. Paroxysmal atrial fibrillation.      a.     CHADS2 score of 4.  2. Status post right hemicolectomy secondary to colon carcinoma.  3. Anemia secondary to status post right hemicolectomy secondary to      colon carcinoma.  4. History of left brain cerebrovascular accident in 2007.  5. Diabetes mellitus.  6. Hypertension, uncontrolled.  7. Hyperlipidemia.  8. Bilateral carotid artery bruits.  9. Bilateral lower extremity pain with exertion.   PLAN:  The patient presents with profound anemia in the setting of colon  mass.  She has undergone right hemicolectomy for presumed colon cancer.  She has been noted to develop paroxysmal atrial fibrillation.  She has  converted back to normal sinus rhythm.  She does have some bradycardia,  when she is in sinus rhythm.  She also has some post termination pauses,  the longest of which recorded was 3.5 seconds.  She is  fairly  asymptomatic with this.  She is only 3 days postop and we will continue  to monitor on telemetry to better ascertain what she does in atrial  fibrillation when she converts to normal sinus rhythm.  She would be a  candidate for a long-term Coumadin given  her significant thromboembolic  risk factors.  We will need to confirm whether or not she is a Coumadin  candidate with the Gastroenterology and Surgery given her presenting  illness.  An echocardiogram has been performed and the results are  pending.  We will check her TSH.  Her current medications will be  continued with the same.  She needs better blood pressure control and  lisinopril will be added back to her medical regimen.  She does have  bilateral carotid bruits.  If she has not had recent cardiac Dopplers,  then she will need followup carotid Dopplers, which can be done as an  outpatient if necessary.  She complained of bilateral lower extremity  pain and this continues to be an issue.  Outpatient ABI can certainly be  considered.   Thank you very much for the consultation.  We will be glad to follow the  patient throughout the remainder of her admission.      Tereso Newcomer, PA-C      Pricilla Riffle, MD, [facility]  Electronically Signed    SW/MEDQ  D:  01/14/2008  T:  01/14/2008  Job:  [id number]

## 2010-11-30 NOTE — Op Note (Signed)
NAMEVANIAH, CHAMBERS                  ACCOUNT NO.:  [id number]   MEDICAL RECORD NO.:  [id number]          PATIENT TYPE:  INP   LOCATION:  IC10                          FACILITY:  [facility]   PHYSICIAN:  Tilford Pillar, MD      DATE OF BIRTH:  December 20, 1932   DATE OF PROCEDURE:  01/11/2008  DATE OF DISCHARGE:                               OPERATIVE REPORT   PREOPERATIVE DIAGNOSIS:  Right-sided colon adenocarcinoma.   POSTOPERATIVE DIAGNOSIS:  Right-sided colon adenocarcinoma.   PROCEDURE:  Laparoscopic partial colectomy with primary staple  anastomosis with hand assistance.   SURGEON:  Tilford Pillar, MD.   ANESTHESIA:  General endotracheal.   LOCAL ANESTHETIC:  None.   SPECIMENS:  Terminal ileum and right colon.   ESTIMATED BLOOD LOSS:  Less than 100 mL.   INDICATIONS:  The patient is an unfortunate 77 year old African American  female who presented to [facility] with anemia of uncertain  etiology.  Workup was initiated while she was in [facility]  including colonoscopy, which demonstrated a mass in the right colon.  Biopsies were obtained, which did come back positive for adenocarcinoma.  I had the opportunity to discuss and evaluate the patient in  consultation prior to her operation.  The risks, benefits, and  alternatives of the laparoscopic possible partial colectomy were  discussed at length with the patient and the patient's family risks  including but not limited to bleeding, infection, anastomotic leak, as  well as the possibility of intraoperative cardiac or pulmonary events  were discussed with the patient.  She did consent to the planned  procedure.  To note, in the preop holding area, the patient did  developed atrial fibrillation, which was a new onset for her, but  otherwise had no abnormalities on her EKG and no symptomatology.  She  was evaluated by the anesthesiologist, who upon administration of  verapamil, was able to control her atrial  fibrillation, and at this  time, the patient was turned back to a normal sinus rhythm.  Risk of  proceeding with the operation were discussed with the patient and her  family, but at this time, the benefits of proceeding with the operation  did warrant proceeding in the operating room at this time.   DESCRIPTION OF PROCEDURE:  The patient was taken to the operating room  and was placed in supine position on the operative table, at which time  the general anesthetic was administered.  Once the patient was asleep,  she was endotracheally intubated by anesthesia.  At this time, she also  had a nasogastric tube placed by Anesthesia and a Foley catheter placed  in the sterile fashion by the operating room staff.  Her left arm was  tucked.  Her abdomen was prepped with DuraPrep solution and sterile  dressing placed in standard fashion.  A stab incision was created in the  left upper quadrant at the palmaris point through which the Veress  needle was placed.  Saline drop test was utilized to confirm  intraperitoneal placement of the Veress needle and then pneumoperitoneum  was  initiated.  Once sufficient pneumoperitoneum was obtained, a 11-mm  trocar was placed in the left lateral abdominal wall through a stab  incision.  The trocar was placed over the laparoscope allowing  visualization of the trocar entering into the peritoneal cavity.  The  inner cannula was removed.  The laparoscope was reinserted. There was no  evidence of any Veress or trocar placement injury.  The Veress needle  was removed at this time.  Next, I turned my attention for placement of  the hand port, an infraumbilical skin incision was created with a #12  blade scalpel.  This was dissected down to subcuticular tissue, was  carried out using electrocautery including the division of the fascia.  Due to the presence of the pneumoperitoneum and visualization of no  adherent bowel in this area, I was able to enter and using   electrocautery to the peritoneal cavity, at which time the  pneumoperitoneum could be decompressed and further enlarged the  peritoneal opening significantly enough to place my hand and then the  HandPort was placed in standard fashion.  Pneumoperitoneum was  reinitiated using a hand assistance.  I was able to palpate the right  colon and identify the palpable mass.  This was quite small, about 2 cm  in diameter on palpitation.  At this time, a second 11-mm trocar was  placed at the epigastrium for a second working port.  At this point,  electrocautery was utilized to dissect the peritoneal portion off of the  right colon along the right line of Toldt.  This was carried up and  around the hepatic flexure.  She was quite thin and a very little intra-  abdominal adipose tissue making this part of the dissection extremely  easy.  On identifying place on the transverse colon to proceed with  division of the distal margins, I turned my attention to the small bowel  for a similar identified segment on the terminal ileum to be divided.  I  did create a window in the present area of the small bowel and terminal  ileum at this point using electrocautery.  The epigastric trocar was  then replaced with a 12-mm trocar for placement of a Endo GIA stapler.  A 45 regular load was then utilized to excise the small bowel.  Ligature  was then utilized to continue the dissection along the small bowel  mesentery as well as along the colonic mesocolon.  Approximating the  right hepatic flexure, I did decide to change my approach and start  distally and work back  proximally on the dissection.  Therefore, I  created a effect in the transverse mesocolon through which I placed a  reload of the Endo GIA 45 stapler.  Two separate firings were fired  slowly to bind  the transverse colon, at which point the ligature was  then utilized to further continue the dissection of the mesocolon  distally to proximal.  This  was connected with prior mesenteric  division, and at this point, the colon specimen was free.  Hemostasis  was excellent and the specimen was removed through the HandPort and was  placed on the back table as permanent specimen.  A HandPort was  reattached and pneumoperitoneum was reinitialized.  I did evaluate the  divided mesentery to ensure no evidence of any hemorrhage.  I used the  suction irrigator to wash off the small amount of bleeding that had  occurred during dissection.  No additional hemorrhage was noted, and at  this time, I divided the proximal end of the transverse colon and opened  it through the HandPort and decompressing the abdomen.   At this time, I circumferentially cleared along the antimesenteric side  of the transverse colon, I cleared the old fatty appendages  allowing a  clear area for the anastomoses.  With this completed, I finally divided  the end of the terminal ileum, placed these in a side-to-side fashion  and placed a 3-0 silk suture ligature to the pexing suture.  Electrocautery was then utilized to create an  enterostomy and colostomy  to further open using a hemostat and then a reloaded Endo GIA stapler  was then again utilized.  A final Endo GIA stapler reload was utilized  to close the final enterostomy, this was elevated using Allis clamp.  I  then palpated the resulting anastomosis, which was widely patent and  oversewed the staple line with 3-0 silk ligature in simple interrupted  fashion.  At this point, I also placed a 3-0 silk suture in the apex of  the anastomosis to release any potential tension on this area of staple  line and then closed the mesenteric defect with interrupted 3-0 silk  suture ligature closure being quite pleased with the appearance of the  anastomoses, I was able to place a piece of omental tongue over this  area.  This was secured with an additional 3-0 silk suture placing it  over the staple line.  The anastomosis was  turned back into the  abdominal cavity.  Again, inspection of the abdominal cavity  demonstrated a dry field, then a Endoclose suture packing device was  utilized to pass a 3-0 Vicryl through both the 12-mm and the 11-mm  trocar site.  With these two sutures in place, the pneumoperitoneum was  evacuated.  The Vicryl sutures were secured and a looped  Novofil suture  was utilized in a running continuous fashion to reapproximate the fascia  and the HandPort, and subcutaneous tissue was irrigated and staples were  utilized to reapproximate the skin edges at the HandPort as well as the  2 trocar sites.  Skin was washed and dried with a moist and dry towel.  Dressings were placed.  The drapes were removed and dressings were  secured.  The patient was allowed to come out of general anesthetic to  be transferred back to regular hospital bed and transferred to  postanesthetic intensive care unit in stable condition.  She tolerated  the procedure well,  maintained a sinus rhythm through the entire operation retaining blood  pressure with no cardiopulmonary issues.  I did open the specimens from  the back table prior to closure identifying the right colonic mass and  proceeded to change the gloves at that time before proceeding with the  closure.      Tilford Pillar, MD  Electronically Signed     BZ/MEDQ  D:  01/11/2008  T:  01/12/2008  Job:  [id number]   cc:   R. Roetta Sessions, M.D.  [address]  [place]  [place] [address]   Tesfaye D. Felecia Shelling, MD  Fax: [phone]   Kassie Mends, M.D.  [address]  [place] , [place] [address]

## 2010-11-30 NOTE — Consult Note (Signed)
Katrina Arnold, Katrina Arnold                  ACCOUNT NO.:  [id number]   MEDICAL RECORD NO.:  [id number]          PATIENT TYPE:  INP   LOCATION:  IC10                          FACILITY:  [facility]   PHYSICIAN:  Tilford Pillar, MD      DATE OF BIRTH:  1932-10-01   DATE OF CONSULTATION:  01/09/2008  DATE OF DISCHARGE:                                 CONSULTATION   REASON FOR CONSULTATION:  Anemia and right colon mass.   HISTORY OF PRESENT ILLNESS:  The patient is a pleasant 77 year old  female with a history of diabetes mellitus type 2, gastroesophageal  reflux disease, and hypertension who presented after outpatient  evaluation demonstrated anemia based on hemoglobin evaluation and she  was admitted for continued management and workup of her anemia.  She has  not had any complaints of abdominal pain, short of breath, chest pain,  palpitations.  She has not noted any change in bowel movement.  No  melena, no hematochezia, no diarrhea.  She did have colonoscopy on this  admission which demonstrated several polyps and a small mass in the  right ascending colon.  Biopsies were obtained which did come back  positive for adenocarcinoma of the right colon.  The patient is aware of  her diagnosis and wishes to proceed with definitive intervention.   PAST MEDICAL HISTORY:  1. Hypertension.  2. Gastroesophageal reflux disease.  3. Diabetes mellitus type 2.  4. Hyperlipidemia.   PAST SURGICAL HISTORY:  Tubal ligation.   MEDICATIONS:  Zocor, glipizide, metformin, verapamil,  hydrochlorothiazide, and Aggrenox.   ALLERGIES:  No known drug allergies.   SOCIAL HISTORY:  She uses snuff.  No alcohol and no recreational drug  abuse.   PHYSICAL EXAMINATION:  VITAL SIGNS:  Temperature 98.0, heart rate 54,  respirations 20, blood pressure 105/59.  GENERAL:  The patient is not in any acute distress.  She is alert and  oriented x3.  HEENT:  Pupils are equal, round, and reactive.  Extraocular movements  are  intact.  No scleral icterus or conjunctival pallor is noted.  NECK:  Trachea is midline.  PULMONARY:  Unlabored respirations.  She is clear to auscultation  bilaterally.  CARDIOVASCULAR:  Regular rate and rhythm.  ABDOMEN:  Positive bowel sounds.  Abdomen is soft, nondistended,  nontender, no masses or hernias are appreciated.  EXTREMITIES:  Warm and dry.   PERTINENT LABORATORY AND RADIOGRAPHIC STUDIES:  CBC, white blood cell  count 7.4, hemoglobin 9.6, hematocrit 28.7, platelets 312.  Potassium on  the day of admission was 4.3.  Pathology report did demonstrate the  right-sided adenocarcinoma on biopsy.   ASSESSMENT/PLAN:  Right colon adenocarcinoma.  At this time, I would  recommend continuing metastatic workup and evaluation and will recommend  obtaining a CT evaluation of the abdomen, a chest x-ray of the lung  fields.  At this point, she has been prepped for colonoscopy.  She has  been advanced to a regular diet.  At this point, I discussed with the  patient continuing this diet and plans for operative intervention upon  completion of the  above-mentioned studies.  Plans will be for right  hemicolectomy on Friday morning.  The risks, benefits, and alternatives  were discussed with the patient and the patient's family.  Full  questions and concerns were addressed.  At this time, continue  preoperative evaluation and management.      Tilford Pillar, MD  Electronically Signed     BZ/MEDQ  D:  01/10/2008  T:  01/11/2008  Job:  [id number]   cc:   Kassie Mends, M.D.  [address]  [place] , [place] [address]   Patrica Duel, M.D.  Fax: [phone]

## 2010-11-30 NOTE — Op Note (Signed)
NAME:  Katrina Arnold, Katrina Arnold                  ACCOUNT NO.:  [id number]   MEDICAL RECORD NO.:  [id number]          PATIENT TYPE:  INP   LOCATION:  A321                          FACILITY:  [facility]   PHYSICIAN:  R. Roetta Sessions, M.D. DATE OF BIRTH:  09/22/1932   DATE OF PROCEDURE:  01/08/2008  DATE OF DISCHARGE:                               OPERATIVE REPORT   PROCEDURE:  Diagnostic esophagogastroduodenoscopy followed by  colonoscopy with biopsy and snare polypectomy.   INDICATIONS FOR PROCEDURE:  A 77 year old lady admitted to the hospital  with anemia with hemoglobin at 7 range and hemoccult-positive stool.  She has had primary GI symptoms that of reflux.  She has never had her  lower GI tract evaluated.  EGD and colonoscopy now being done.  Risks,  benefits, alternatives, and limitations have been reviewed previously  and questions answered, all parties agreeable.  Please see the  documentation in the medical record.   PROCEDURE NOTE:  O2 saturation, blood pressure, pulse, and respirations  were monitored throughout entirety of both procedures.   CONSCIOUS SEDATION:  Versed 3 mg IV and Demerol 50 mg IV in divided  doses.   INSTRUMENT:  Pentax video chip system.   Cetacaine spray topical pharyngeal anesthesia.   FINDINGS:  EGD, examination of the tubular esophagus revealed 3 quadrant  distal esophageal linear erosion, longest being 3 cm, beginning from the  EG junction.  The EG junction was patulous and easily traversed.  Stomach:  Gastric cavity was empty and insufflated well with air.  A  thorough examination of the gastric mucosa including retroflexion at the  proximal stomach and esophagogastric junction demonstrated a small  hiatal hernia, otherwise gastric mucosa appeared normal.  Pylorus  patent, easily traversed.  Examination of bulb and second portion  revealed no abnormalities.   Therapeutic and diagnostic maneuvers performed none.   The patient tolerated the procedure  well.   Colonoscopy:  Digital rectal exam revealed no abnormalities.  Endoscopic  findings:  Unfortunately, prep was marginal, quite a bit of granular  liquid stool throughout the colon, which had to be dealt with adequately  imaged colon.  Colon:  Colonic mucosa was surveyed from the rectosigmoid  junction through the left transverse, right colon to the area of the  appendiceal orifice, ileocecal valve, and cecum.  These structures well  seen and photographed for record.  Terminal ileum was intubated to 5 cm.  From this level, scope was slowly cautiously withdrawn.  All previous  mentioned mucosal surfaces were again seen.  The patient had following  abnormalities.  She had left-sided diverticula.  She had three 5-10 mm  polyps about the splenic flexure, which were hot snared and recovered.  There was a single 5-mm polyp opposite the ileocecal valve, which was  cold snared.  Approximately 8 cm distal to the ileocecal valve, there  was a semilunar hard colonic neoplasm measuring approximately 3 x 5+ cm  in length, impartially obscured between two folds, please see  photographs.  This was biopsied multiple times with the jumbo biopsy  forceps.  The remainder of colonic  mucosa appeared unremarkable.  Scope  was pulled down the rectum.  Thorough examination of the rectal mucosa  including retroflexed view of the anal verge demonstrated a diminutive  polyp at 15 cm, was cold biopsy/removed and internal hemorrhoids.  The  patient tolerated both procedures well, was reacted to EGD.  Distal  esophageal erosions consistent with erosive reflux esophagitis, small  hiatal hernia, patulous EG junction, otherwise unremarkable stomach D1-  D2.   COLONOSCOPY FINDINGS:  1. Internal hemorrhoids, diminutive rectal polyp status post cold      biopsy/remainder of rectal mucosa appeared normal, left-sided      diverticula.  2. Three splenic flexure polyp, status post snare removal, semilunar      hard  neoplastic process in the ascending colon 8 cm, distal to the      ileocecal valve, status post biopsy.  3. A 5-mm pedunculated biopsy, ileocecal valve, status post cold snare      removal.   RECOMMENDATIONS:  1. Increase proton pump inhibitor therapy to b.i.d. for      gastroesophageal reflux disease.  2. Surgery consultation for right hemicolectomy.      Jonathon Bellows, M.D.  Electronically Signed     RMR/MEDQ  D:  01/08/2008  T:  01/09/2008  Job:  [id number]   cc:   Tesfaye D. Felecia Shelling, MD  Fax: [phone]

## 2010-11-30 NOTE — Consult Note (Signed)
Katrina Arnold, Katrina Arnold                  ACCOUNT NO.:  [id number]   MEDICAL RECORD NO.:  [id number]          PATIENT TYPE:  INP   LOCATION:  IC10                          FACILITY:  [facility]   PHYSICIAN:  Ladona Horns. Neijstrom, MD  DATE OF BIRTH:  Jan 26, 1933   DATE OF CONSULTATION:  01/18/2008  DATE OF DISCHARGE:                                 CONSULTATION   DIAGNOSES:  1. Stage IIA cancer of the ascending colon, adenocarcinoma type that      was associated with 28 negative nodes, moderately differentiated      type, no lymphovascular invasion was seen.  No liver involve was      mentioned at the time of surgical procedure.  Preoperative CT scan      did not reveal metastatic disease either.  2. History of stroke with residual right-handed weakness, 2 years ago.  3. Diabetes mellitus x30 years.  4. History of hypertension times many years.  5. History of hyperlipidemia.   MEDICATIONS:  She is on Zocor.  She is also on Aggrenox twice a day  200/25 for her history of stroke.   This a very, very pleasant lady who is now 77 years old and she passed  out one night while going to get some water at the refrigerator in the  kitchen.  She told her daughter the next day who initiated bringing her  to Dr. Felecia Shelling.  She was found to be anemic.   She was also found to be iron deficient as a cause for this anemia.   At the time of her admission, she had a reticulocyte count of 70,000 and  iron 21, TIBC of 378, a low percent saturation is 6%.  She had a B12  level of 219 which is at the low end of normal.  Folic acid level was  normal.  Ferritin was only 9.   Her CBC on the 23rd showed an MCV of 78.  Her hemoglobin was 10.  Her  CMET on June 22 showed a normal BUN and creatinine.  She had normal  liver enzymes and normal electrolytes.   She has also had a TSH while here that is also in the normal range.  Her  magnesium on June 30 was also in the normal range of 1.5 and a repeat on  July 1 was 1.6.   FAMILY HISTORY:  Her mother is deceased from complications of diabetes  mellitus at age 61.  Her father, she did not know well but she had heard  that he had complications of diabetes mellitus and died finally of an  MI, she was not sure of his age.  She was an only child of her father  and mother's union.  She did have some half-brothers and half-sisters.  One half-sister had some type of cancer, her name was Idell McClurkin.  One half-brother also is deceased, she is not sure of the cause.  She  has been married for many years.  She and her husband have 8 children, 5  sons and 3 daughters.  One is extremely overweight  in a somewhat massive  way she states.   She has lost weight over the years, really over the last 10-15 years she  weighed 179 at her maximum, she is now down to about 140 pounds she  states.  At the time that she was last checked here on admission, her  weight was 66.7 kg.  Her height was 63 inches.   She is not a smoker now, she has dipped snuff for many many years.   She had all of her teeth removed because of complications of gingivitis.   She worked for [employer] until retirement.   She does not use alcohol, does not smoke as I mentioned.   She had not really had abdominal pain until that day that she passed  out.  She really was not sure anything was wrong.  She did not have eyes  craving or other pica symptoms.   She has no trouble swallowing.   PHYSICAL EXAMINATION:  GENERAL:  She is a very pleasant woman in no  acute distress.  Her height and weight have already been mentioned from  the admission date.  She is afebrile today.  VITAL SIGNS:  Blood pressure is normal at this time.  She has  respirations of 16 and unlabored, pulse rate around 80 and regular.  LUNGS:  Clear to auscultation.  She has no obvious adenopathy.  BREASTS:  Negative for masses, but she has not had mammography recently.  She certainly needs to have that done.  ABDOMEN:  Soft.   Incision are looking quite healthy.  She has mild, but  definite bowel sounds.  She has no peripheral edema.  EXTREMITIES:  Pulses in her feet are definitely diminished.  She is right-handed.  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  Her right third, fourth, and fifth  fingers do not contract easily and she has decreased strength on the  right.  Left hand is unremarkable.  Facial symmetry was intact.  She is  edentulous.  Tongue is unremarkable.   She moves all of her toes and feet quite easily.   This lady does need a baseline CEA every 12 weeks for her stage IIA  cancer of the colon.  I need to see her once a year, but she does need  to repeat colonoscopy in 12 months.   She is not in need of adjuvant chemotherapy at this juncture.   Her prognosis is good and I think she has between 5 and 15% chance of  recurrence with a stage IIA disease process which is why she needs a CEA  monitored on an every 12 week basis.   She also needs to take on oral iron tablet once a day for 6 months,  replenish her iron stores, and I think resumption of mammography on a  regular basis would be very helpful.      Ladona Horns. Mariel Sleet, MD  Electronically Signed     ESN/MEDQ  D:  01/18/2008  T:  01/19/2008  Job:  [id number]   cc:   Adalberto Cole  Fax: [phone]   R. Roetta Sessions, M.D.  [address]  [place]  [place] [address]   Tesfaye D. Felecia Shelling, MD  Fax: [phone]

## 2010-11-30 NOTE — Letter (Signed)
March 12, 2008    Tesfaye D. Felecia Shelling, MD  [address]  [place], [place] [address]   RE:  Katrina Arnold, Katrina Arnold  MRN:  [id number]  /  DOB:  1933/05/30   Dear Ninetta Lights,   [name] returns to the office for continued assessment and treatment of  paroxysmal atrial fibrillation, which first occurred when she was  admitted to [facility] for carcinoma of the colon with severe  anemia.  She has, apparently, maintained sinus rhythm on amiodarone  since that hospitalization.  She reports an episode of dysphasia with a  piece of stake sticking at the level of her mid sternum.  She has had no  other similar episodes.  She has had no chest discomfort nor dyspnea.  She feels generalized weakness, but is improving with rehabilitation.  She has noted no palpitations, lightheadedness, nor syncope.   Current medications are unchanged from her last visit except for  adjustment of her amiodarone dose, currently 200 mg daily.   PHYSICAL EXAMINATION:  GENERAL:  Pleasant woman in no acute distress.  VITAL SIGNS:  The weight is 145, 7 pounds more than in July.  Blood  pressure 115/70, heart rate 50 and regular, and respirations 14.  NECK:  No jugular venous distention; no carotid bruits.  LUNGS:  Clear.  CARDIAC:  Normal first and second heart sounds; modest early systolic  ejection murmur.  ABDOMEN:  Soft and nontender; no organomegaly.  EXTREMITIES:  No edema; normal distal pulses.   EKG:  Sinus bradycardia; delayed R-wave progression-cannot exclude prior  septal myocardial infarction; nonspecific T-wave abnormality.  Comparison to prior tracing of January 30, 2008:  T-wave flattening is more  evident at present.  Heart rate has increased somewhat from 45 on the  last examination to 52 on the current.   IMPRESSION:  Katrina Arnold is doing generally well.  Since atrial  fibrillation may have been related to her acute illness, we will  discontinue amiodarone and metoprolol to determine if her  arrhythmia  will recur.  Her risk for thromboembolism is low-to-moderate, and  aspirin therapy is appropriate.  She is complaining of constipation.  I  suggested she can hold iron for the time being.  We will check a  chemistry profile and CBC.  If results are good, I will plan to see this  nice woman again in 7 months after an event recorder has been worn for 1  month.    Sincerely,      Gerrit Friends. Dietrich Pates, MD, [facility]  Electronically Signed    RMR/MedQ  DD: 03/12/2008  DT: 03/13/2008  Job #: [id number]

## 2010-11-30 NOTE — Op Note (Signed)
NAME:  Katrina Arnold, Katrina Arnold                  ACCOUNT NO.:  [id number]   MEDICAL RECORD NO.:  [id number]          PATIENT TYPE:  AMB   LOCATION:  DAY                           FACILITY:  [facility]   PHYSICIAN:  R. Roetta Sessions, M.D. DATE OF BIRTH:  Dec 26, 1932   DATE OF PROCEDURE:  02/16/2009  DATE OF DISCHARGE:                               OPERATIVE REPORT   PROCEDURE:  Colonoscopy with saline-assisted hot snare piecemeal  polypectomy.   INDICATIONS FOR PROCEDURE:  This is a 77 year old lady who underwent an  extended right hemicolectomy for ascending colon cancer back in June of  2009.  She is here for her one-year surveillance.  She has done well.  She is seeing Dr. Mariel Sleet.  Apparently she is not requiring any  adjuvant therapy with limited stage disease.  She has no lower GI tract  symptoms.  Colonoscopy is now being done.  The risks, benefits,  alternatives, and limitations have been discussed.  Please see the  documentation in the medical record.  The patient stopped her Coumadin 4  days ago at our request.   PROCEDURE NOTE:  O2 saturation, blood pressure, pulse, and respirations  were monitored throughout the entirety of the procedure.  Conscious  sedation with Versed 2 mg IV and Demerol 50 mg IV in a single dose.  Instrument:  Pentax video chip system.   FINDINGS:  Digital rectal exam revealed no abnormalities.  Endoscopic  findings:  Prep was adequate.  Colonic mucosa was surveyed from the  rectosigmoid junction to the anastomosis with the small bowel.  The  anastomosis appeared normal.  From this level, the scope was slowly  withdrawn.  All previously-mentioned mucosal surfaces were again seen.  At 45 cm (corresponding to what I believe is the mid-descending colon)  with the scope advanced in a nice one-to-one fashion, we found a 1.5 x 2  cm flat, donut-shaped polyp in between two folds.  Please see photos.  There were a couple of tiny diminutive polyps adjacent to this lesion  which were not manipulated.  This lesion was lifted away from the  colonic wall with approximately 4 mL of normal saline injected  submucosally.  This was done without difficulty or apparent  complication.  Subsequently using hot snare technique, this lesion was  removed nicely with piecemeal polypectomy with approximately four  fragments being submitted to the pathologist.  There was no bleeding  associated with this maneuver or apparent complication.  This lesion was  also tattooed with Endospot.  The __________ scope was withdrawn out.  All previously-mentioned mucosal surfaces were again seen.  The scope  was pulled down to the rectum where examination done of the rectal  mucosa demonstrated normal mucosa.  The patient tolerated the procedure  well.   IMPRESSION:  1. Normal rectum.  2. Sessile polyp, mid-descending colon, as described above, status      post hot snare piecemeal saline-assisted polypectomy, site      tattooed, adjacent diminutive polyp not manipulated, status post      right hemicolectomy.   RECOMMENDATIONS:  1. No aspirin  or __________ medications for 5 days.  2. I have asked Ms. Heinlein not to resume Coumadin until February 19, 2009.  3. Follow up on pathology.  4. Further recommendations to follow.      Jonathon Bellows, M.D.  Electronically Signed     RMR/MEDQ  D:  02/16/2009  T:  02/16/2009  Job:  [id number]   cc:   Ladona Horns. Mariel Sleet, MD  Fax: [phone]   Tilford Pillar, MD  Fax: [phone]

## 2010-11-30 NOTE — Assessment & Plan Note (Signed)
[facility]                       [facility] OFFICE NOTE   NAME:Katrina Arnold, Katrina Arnold                         MRN:          [id number]  DATE:11/14/2008                            DOB:          June 01, 1933    REFERRING PHYSICIAN:  Tesfaye D. Felecia Shelling, MD   CARDIOLOGIST:  Gerrit Friends. Dietrich Pates, MD, [facility]   REASON FOR VISIT:  Atrial fibrillation.   HISTORY OF PRESENT ILLNESS:  Katrina Arnold is a 77 year old female patient  with a history of paroxysmal atrial fibrillation in the setting of colon  surgery for adenocarcinoma.  She had profound anemia at that time as  well.  She did not require chemotherapy or radiation therapy post-  resection.  We maintained her on amiodarone and aspirin.  She remained  in sinus rhythm and was last seen by Dr. Dietrich Pates in August 2009.  At  that point, her amiodarone and beta-blocker was discontinued in the  setting of continued normal sinus rhythm.  Her thromboembolic risk  factors are multiple and include prior history of stroke in 2007,  diabetes mellitus, hypertension.  She recently developed upper  respiratory tract infection symptoms and obtained some over-the-counter  medication.  This was several days ago.  She is not sure of the name.  It has been several days since she took the medication.  She saw Dr.  Felecia Shelling in follow up recently and was noted to be back in atrial  fibrillation.  She is fairly asymptomatic.  She denies any tachy  palpitations, significant shortness of breath, chest discomfort,  fatigue, or lightheadedness.  She denies orthopnea, PND, or pedal edema.  She denies syncope.  She is now referred back to [place] for further  management of her atrial fibrillation.   CURRENT MEDICATIONS:  1. Lisinopril 20 mg b.i.d.  2. Simvastatin 20 mg daily.  3. Amlodipine 10 mg daily.  4. Glipizide 2.5 mg daily.  5. Metformin 500 mg b.i.d.  6. HCTZ 12.5 mg daily.  7. Aspirin 81 mg daily.  8. Omeprazole 20 mg daily.  9.  Metoprolol 25 mg daily.  10.Tylenol p.r.n.   REVIEW OF SYSTEMS:  Please see HPI.  She denies fevers, melena,  hematochezia, hematuria, or dysuria.   PHYSICAL EXAMINATION:  GENERAL:  She is a well-nourished and well-  developed female in no distress.  VITAL SIGNS:  Blood pressure is 107/72, pulse 100, and weight 156  pounds.  HEENT:  Normal.  NECK:  Without JVD.  CARDIAC:  Normal S1 and S2.  Irregularly irregular rhythm.  LUNGS:  Clear to auscultation bilaterally.  No wheezing, rhonchi, or  rales.  ABDOMEN:  Soft and nontender with normoactive bowel sounds.  No  organomegaly.  EXTREMITIES:  Without edema.  NEUROLOGIC:  She is alert and oriented x3.  Cranial nerves II-XII are  grossly intact.  SKIN:  Warm and dry.   Electrocardiogram reveals atrial fibrillation with heart rate of 100,  normal axis, and no ischemic changes.   ASSESSMENT AND PLAN:  1. Paroxysmal atrial fibrillation with rapid ventricular rate.  Her      CHADS2 score is 4 with  prior history of stroke, diabetes, and      hypertension.  She is also 77 years old and will have a score of 5      in January.  She is fairly asymptomatic with her atrial      fibrillation.  I discussed her case with Dr. Dietrich Pates who also saw      the patient.  We do not think she requires reinitiation of      amiodarone therapy.  She does, however, require anticoagulation      with Coumadin.  She can discontinue her aspirin.  She will be set      up in the Coumadin clinic with Coumadin teaching.  We will check      baseline labs to include a CBC, BMET, TSH, PT and PTT.  She also      have stool cards obtained.  She will have her heart rate checked      with the nurse when she returns for her Coumadin followup and she      will follow up with Dr. Dietrich Pates in the next couple of months.  2. Hypertension.  Her HCTZ was recently decreased by Dr. Felecia Shelling.  Her      pressure should be able to tolerate an increase in her metoprolol.      When she  returns for followup, if her pressures are running low, we      can certainly cut back on her Norvasc or discontinue her HCTZ      altogether.  3. Adenocarcinoma of the colon.  The patient seems to think this was      noncancerous.  I have pulled out the pathology report and she did      have evidence of adenocarcinoma.  Her risk for bleeding is now      reduced with removal of this tumor.  She is several months out from      her surgery and her risk of bleeding should be low on Coumadin.      She has been advised to contact us, if she does note any hematuria,      hematemesis, melena, hematochezia, or epistaxis.   DISPOSITION:  She will follow up with me or Dr. Dietrich Pates in the next 2  months or sooner p.r.n.     Tereso Newcomer, PA-C  Electronically Signed      Gerrit Friends. Dietrich Pates, MD, [facility]  Electronically Signed   SW/MedQ  DD: 11/14/2008  DT: 11/15/2008  Job #: [id number]   cc:   Tesfaye D. Felecia Shelling, MD

## 2010-12-03 NOTE — H&P (Signed)
Katrina Arnold, Katrina Arnold                  ACCOUNT NO.:  [id number]   MEDICAL RECORD NO.:  [id number]          PATIENT TYPE:  EMS   LOCATION:  MAJO                         FACILITY:  [facility]   PHYSICIAN:  Bevelyn Buckles. Champey, M.D.DATE OF BIRTH:  Mar 06, 1933   DATE OF ADMISSION:  02/20/2006  DATE OF DISCHARGE:                                HISTORY & PHYSICAL   REASON FOR ADMISSION:  Code stroke.   HISTORY OF PRESENT ILLNESS:  Katrina Arnold is a 77 year old African American  female with multiple medical problems who presents to the ED after awakening  this morning with right upper extremity weakness and speech difficulty.  The  patient was in her normal state of health when she went to bed last evening  around 11 p.m.  When she woke up this morning around 8:00 to 8:15 a.m., she  noticed she could not move her right upper extremity, felt weak and numb,  and she could not get words out.  Her husband also noticed that she was  unsteady on her feet.  She denies any headaches, vision changes, dizziness,  vertigo, falls or loss of consciousness.  The patient also denies any prior  events.   PAST MEDICAL HISTORY:  1.  Hypertension.  2.  Diabetes.  3.  High cholesterol.   CURRENT MEDICATIONS:  1.  Lisinopril.  2.  Glipizide.  3.  Metformin.  4.  Verapamil.  5.  Zocor.   ALLERGIES:  The patient has no known drug allergies.   FAMILY HISTORY:  Positive for hypertension.   SOCIAL HISTORY:  The patient lives with her husband.  Denies any alcohol  use.  She does dip snuff.   REVIEW OF SYSTEMS:  Positive as per HPI.  Negative as per HPI and greater  than seven other organ systems.   PHYSICAL EXAMINATION:  VITAL SIGNS:  Temperature 98.0, pulse 81,  respirations 24, blood pressure 203/96, O2 saturation 100%.  HEENT:  Normocephalic, atraumatic.  Extraocular muscles intact.  Pupils  equal, round and reactive to light.  NECK:  Supple.  No carotid bruits.  HEART:  Regular.  LUNGS:  Clear.  ABDOMEN:   Soft, nontender.  EXTREMITIES:  Good pulses.  NEUROLOGICAL:  The patient is awake and alert.  Language:  She has  difficulty expressing words at times.  Her speech is fluent when she gets  words out.  The patient does follow commands appropriately.  Cranial nerves:  The patient has a mild right facial asymmetry.  Extraocular muscles intact.  Pupils equal, round and reactive to light.  Tongue is midline.  Motor  examination shows 0-5 strength in right upper extremity, 4+ to 5/5 strength  on the left.  The patient also has decreased tenderness in the right upper  extremity.  Sensation is decreased sensation on the right when compared to  the left.  She does seem to have extension on the right.  Reflexes are 0 to  trace throughout.  Right toe is neutral, left toe is downgoing.  Cerebellar  function:  No ataxia is noted.  Right upper extremity is limited  secondary  to no strength.  Gait is not assessed for safety.   LABORATORY DATA:  Sodium 141, potassium 3.5, chloride 107, bicarbonate 25.5,  BUN 12, creatinine 1.1, glucose 139, WBC 6.6, hemoglobin 12.7, hematocrit  38.2, platelets 242,000.  CT of the head showed a small area of  hypodensities in the left frontal periventricular area.  No blood was  noticed.   IMPRESSION:  This is a 77 year old African American female who presents with  right sided weakness and aphasia upon wakening this morning.  The patient is  not a candidate for IV TPA as symptoms are greater than 3 hours out, as the  patient was last normal last evening when she went to bed and woke with  these symptoms.  Will admit the patient to the stroke service and start an  aspirin per day.  Will check MRI of brain, carotid Doppler's and 2D  echocardiogram.  Will also check lipids, homocysteine level and hemoglobin  A1C and get PT/OT/speech consults.  Will continue her home medications.  Will keep the patient on p.o. with IV fluids until she clears a swallowing  evaluation.   Also, placed patient on a DVT prophylaxis.  Will follow the  patient while she is in the hospital.      Bevelyn Buckles. Nash Shearer, M.D.  Electronically Signed     DRC/MEDQ  D:  02/20/2006  T:  02/20/2006  Job:  [id number]

## 2010-12-03 NOTE — Discharge Summary (Signed)
Katrina Arnold, GALLINA NO.:  [id number]   MEDICAL RECORD NO.:  [id number]          PATIENT TYPE:  IPS   LOCATION:  4035                         FACILITY:  [facility]   PHYSICIAN:  Erick Colace, M.D.DATE OF BIRTH:  Aug 17, 1932   DATE OF ADMISSION:  02/23/2006  DATE OF DISCHARGE:  03/02/2006                                 DISCHARGE SUMMARY   DISCHARGE DIAGNOSES:  1. Left cerebrovascular accident with right hemiplegia.  2. Aphasia secondary to cerebrovascular accident.  3. Non-insulin-dependent diabetes mellitus, hyperlipidemia, hypertension.   HISTORY OF PRESENT ILLNESS:  This is a 77 year old African American female  admitted February 20, 2006 with right-sided weakness and aphasia.  MRI with  left posterior frontal infarction.  She did not receive TPA.  Carotid  Dopplers negative.  MRA with 50-60% basilar stenosis.  Echocardiogram with  ejection fraction of 75-80%, no emboli.  Neurology consult, Dr. Pearlean Brownie.  Placed on aspirin and Aggrenox for CVA prophylaxis.  Blood pressures  monitored with lisinopril, Verapamil and hydrochlorothiazide and monitoring  of renal function while on diuretic with creatinine 1.3.  She was admitted  for comprehensive rehab program.   PAST MEDICAL HISTORY:  See discharge diagnosis.  No alcohol.  She does dip  snuff.   SOCIAL HISTORY:  Lives with husband, daughter and 68 year old granddaughter.  Daughter works day shift and husband works night shift.  One level home, two  steps to entry.   MEDICATIONS:  Medications prior to admission were Zocor 20 mg daily,  lisinopril with hydrochlorothiazide 20/25 twice daily, verapamil 180 mg  daily, Glucophage 500 mg daily and glipizide 10 mg daily.   REHABILITATION HOSPITAL COURSE:  The patient was admitted to inpatient rehab  services with therapies initiated on a 3-hour daily basis consisting of  physical therapy, occupational therapy, speech therapy and rehabilitation  nursing.  The following  issues were addressed during the patient's  rehabilitation stay.  Pertaining to Katrina Arnold's left cerebrovascular  accident, she remained on Aggrenox/aspirin therapy, strength grossly graded  at 3-/5 to the right hip flexors, 2-/5 right upper extremity except 2-/2  grip.  She exhibited no signs of unsafe behaviors, she was now modified  independent in her room and home health therapies have been arranged.  She  was able to communicate her needs.  She exhibited no swallowing  difficulties.  She remained on Glucophage/glipizide for diabetes mellitus.  She had some hypoglycemia 53/63.  She remained asymptomatic.  She would be  continued on her Glucophage 500 mg daily which was decreased from twice  daily during her rehabilitation stay as well as continuing on her glipizide  at 10 mg daily.  Her blood pressures are well controlled on  lisinopril/hydrochlorothiazide and verapamil, although her  hydrochlorothiazide had been decreased to daily due to some creatinine of  1.3 which improved to 1.2 and close monitoring of which she would follow up  with her primary MD, Dr. Felecia Shelling.  She exhibited no signs of bowel or bladder  disturbances.  Overall she was supervision for her functional mobility,  occasional cues for dressing and she was  discharged to home.   Latest labs showed a sodium 134, potassium 4.4, BUN 23, creatinine 1.2,  hemoglobin 12.8, hematocrit 37.5, platelet 269,000.   DISCHARGE MEDICATIONS:  Discharge medications at time of dictation included  Zocor 20 mg daily, glipizide 10 mg daily, lisinopril 20 mg twice daily,  verapamil 180 mg daily, Glucophage 500 mg daily, aspirin 81 mg daily,  Aggrenox one capsule daily until March 07, 2006 then increase to twice  daily, hydrochlorothiazide 25 mg daily.   DIET:  Diet was diabetic diet.   ACTIVITY:  She would receive home health therapies as advised per rehab  services.   FOLLOW UP:  Follow up with Dr. Claudette Laws at the [facility] on March 28, 2006.  Follow up Dr. Felecia Shelling medical management  and Dr. Pearlean Brownie neurology services.      Katrina Arnold, P.A.      Erick Colace, M.D.  Electronically Signed    DA/MEDQ  D:  03/01/2006  T:  03/01/2006  Job:  [id number]   cc:   Tesfaye D. Felecia Shelling, MD  Pramod P. Pearlean Brownie, MD

## 2010-12-03 NOTE — Discharge Summary (Signed)
NAME:  SHALIAH, WANN                  ACCOUNT NO.:  [id number]   MEDICAL RECORD NO.:  [id number]          PATIENT TYPE:  INP   LOCATION:  3004                         FACILITY:  [facility]   PHYSICIAN:  Pramod P. Pearlean Brownie, MD    DATE OF BIRTH:  April 22, 1933   DATE OF ADMISSION:  02/20/2006  DATE OF DISCHARGE:  02/23/2006                                 DISCHARGE SUMMARY   DIAGNOSES AT TIME OF DISCHARGE:  1. Left posterior frontal infarct, likely secondary to small vessel      disease.  2. Dyslipidemia.  3. Uncontrolled diabetes.  4. Hypertension.   MEDICINES AT TIME OF DISCHARGE:  1. Zocor 20 mg a day.  2. Glucotrol 10 mg a day.  3. Lisinopril 30 mg in the morning, 20 mg in the evening.  4. Hydrochlorothiazide 25 mg a day.  5. Verapamil SR 180 a day.  6. Hydrochlorothiazide increase the dose prior to 37.5 mg in the morning,      25 mg in the evening.  7. Glucophage 500 mg b.i.d.  8. Aspirin 81 mg q.a.m. x2 weeks beginning February 22, 2006 then      discontinuing.  9. Aggrenox one p.o. nightly beginning February 22, 2006.  After two weeks      increase to b.i.d.  10.Tylenol 650 mg 30 minutes prior to Aggrenox dose x1 week only.   STUDIES PERFORMED:  1. CT of the brain on admission shows focal area of attentuation within      the periventricular white matter on the anterior horn of the left      lateral ventricle consistent with chronic small vessel disease, high      density focus within the left MCA which may represent a clot.  2. MRI of the brain shows a high-left posterior frontal infarct of      subacute nature with associated meningeal enhancement, remote infarct      left corona radiata, slight lingual proximal, scattered subcortical T2      hyperintensities bilaterally, nonspecific but likely represent chronic      microvascular ischemia.  3. MRA of the head shows proximal basilar artery stenosis 50% to 60%      severe genu cavernous carotid artery stenosis greater than 90%.  4. No  focal branch vessel occlusion or aneurysm.  5. Chest x-ray shows no acute findings.  6. EKG shows normal sinus rhythm.  7. Transthoracic echocardiogram shows an EF of 75% to 80% with no left      ventricular regional wall motion abnormalities.  No evidence of cardiac      source embolus.  8. Carotid Doppler shows no ICA stenosis.  9. Transcranial Dopplers are not present in chart.   LABORATORY STUDIES:  CBC normal, differential normal, coagulation studies  normal.  Chemistry with glucose 145, otherwise normal.  Liver function test  with ALT 132, otherwise normal.  Hemoglobin A1c 7.5, homocysteine 9.0,  cholesterol 144, triglycerides 82, HDL 42, and LDL 86.  Urine analysis is  normal.   HISTORY OF PRESENT ILLNESS:  Katrina Arnold is a 77 year old African-American  female with multiple medical problems who presents to the emergency  department after awakening with right upper extremity weakness and speech  difficulty.  She was in a normal state of health when she went to bed the  evening prior at 11 p.m.  When she woke up at 8:15 a.m. she noticed she  could not move her right upper extremity that we could note and she could  not get her words out.  Her husband also noted she was unsteady on her feet.  She was brought to [facility] emergency room where she was unable to receive  TPA secondary to timeframe.  She was admitted to the hospital for further  stroke evaluation.   HOSPITAL COURSE:  MRI did reveal an acute left posterior frontal infarct,  likely secondary to small vessel disease and poorly controlled diabetes.  She has vascular risk factors of poorly-controlled diabetes, dyslipidemia,  hypertension, age, race.  She was started on Aggrenox for secondary stroke  prevention.  The patient also is a smoker and was advised to stop smoking  cigarettes.  She was evaluated by PT and OT and thought to be a good rehab  candidate.  Her family was agreeable and arrangements were made for   transfer.   CONDITION ON DISCHARGE:  The patient alert and oriented x3, mild expressive  aphagia with dysarthria.  She has word-finding difficulties and difficulty  repeating at times.  Her right face is weak.  Her right upper extremity is  2/5 proximally, 0/5 distally, and her right lower extremity is 3/5.  She  walks with an unsteady gait and has decreased finger-nose-finger on the  right and some ataxia there also.   DISCHARGE PLAN:  1. Transfer to rehab for continuation of PT, OT, and speech therapy.  2. Aggrenox for secondary stroke prevention.  3. Aggressive risk factor control for diabetes, hypertension, and      dyslipidemia.  4. Follow up with primary care physician one month after discharge from      rehab for risk factor control.  5. Follow up with Dr. Meryl Dare in his office in two to three months.      Annie Main, N.P.    ______________________________  Sunny Schlein. Pearlean Brownie, MD    SB/MEDQ  D:  02/23/2006  T:  02/23/2006  Job:  [id number]

## 2010-12-03 NOTE — Assessment & Plan Note (Signed)
August 07, 2006   [name] states that she has been going through outpatient physical  therapy since I last saw her, working on PIP and DIP range of motion  using what sounds like a paraffin bath followed by range of motion  exercises.  She has had no new medical complications in the interval  time.  She has a couple more weeks of therapy left.  She is sleeping  well.  She is walking well.  She climbs steps.  She has not been doing  any driving.  She continues to have some stiffness in the right hand.   Blood pressure 122/59, pulse 70, respirations 17, O2 saturation 100% on  room air.  In no acute distress.  Mood and affect appropriate.  She has extension contracture at the right second and third digit DIP  and PIP.  She has no joint swelling.  She has no skin dystrophic  changes.  She does have mild dorsal hand edema.   IMPRESSION:  Left cerebral infarct causing right spastic hemiplegia  exacerbating a pre-existing Dupuytren's contracture.  She has non-  significant spasticity.  I think that the distal interphalangeal and  proximal interphalangeal contractures are best treated with physical  therapy and paraffin bath.  Following completion, continue at home.  I  will see her back in about 6 weeks.  That should be the last time I need  to see her, unless she develops any other rehabilitation-related  problems.   This is discussed with both the patient and her daughter.      Erick Colace, M.D.  Electronically Signed     AEK/MedQ  D:  08/07/2006 15:29:09  T:  08/07/2006 16:42:22  Job #:  [id number]   cc:   Pramod P. Pearlean Brownie, MD  Fax: [phone]   Tesfaye D. Felecia Shelling, MD  Fax: [phone]

## 2010-12-03 NOTE — Assessment & Plan Note (Signed)
Katrina Arnold states she has been doing pretty well since I last saw her  on May 15, 2006.  She has a left parietal CVA causing mainly right  upper extremity weakness and decreased range of motion.  Her right  shoulder pain has improved after the scapular mobilization exercises.  She has no current pain but has some with activity in the right hand,  mainly in the knuckles.  She can walk without any specific limitations,  no assistive device. She is independent with her self care mobility but  has not resumed driving yet.   PHYSICAL EXAMINATION:  Blood pressure 137/58, pulse 71, respirations 16,  O2 sat 97% room air.  In general, in no acute distress, mood and affect appropriate.  Her right upper extremity has extensor contracture at the first through  fourth DIP and PIP.  She has mild swelling of the fingers. No wrist  swelling. She does have some pain with wrist range of motion,  flexion/extension. No pain with shoulder range of motion, negative drop  arm test.   IMPRESSION:  Right first through fourth digit proximal interphalangeal,  distal interphalangeal extensor contracture with decreased flexor  strength.  She does have a pre-existing right greater than left fifth  digit Dupuytren's contracture.   PLAN:  1. Send her to outpatient therapy as she has finished up home health.  2. See her back in 6 weeks.  3. No injections or oral medications needed in regard to her right      upper extremity problems.  No significant spasticity.      Erick Colace, M.D.  Electronically Signed     AEK/MedQ  D:  06/26/2006 09:55:14  T:  06/26/2006 13:36:52  Job #:  [id number]

## 2010-12-03 NOTE — H&P (Signed)
NAMEQAMAR, ROSMAN NO.:  [id number]   MEDICAL RECORD NO.:  [id number]          PATIENT TYPE:  INP   LOCATION:                                 FACILITY:   PHYSICIAN:  Ellwood Dense, M.D.   DATE OF BIRTH:  Jul 20, 1932   DATE OF ADMISSION:  02/23/2006  DATE OF DISCHARGE:                                HISTORY & PHYSICAL   ADMITTING PHYSICIAN:  Dr. Claudette Laws.   PRIMARY CARE PHYSICIAN:  Dr. Ouida Sills.   NEUROLOGIST:  Dr. Pearlean Brownie.   HISTORY OF PRESENT ILLNESS:  Ms. Badgett is a 77 year old African-American  female with history of non-insulin-dependent diabetes mellitus along with  hypertension and dyslipidemia.   The patient was admitted February 20, 2006 with right-sided weakness along with  aphasia.  The MRI study showed left posterior frontal infarct.  The patient  did not receive tPA.  Carotid duplex studies were negative for internal  carotid artery stenosis.  The MRA studies showed 50-60% basilar stenosis.  Echocardiogram showed ejection fraction of 75-80% without evidence of  emboli.  Dr. Pearlean Brownie from Neurology was consulted, and he recommended aspirin  and Aggrenox for stroke prophylaxis.  Blood pressure control has been  monitored on lisinopril, verapamil, and hydrochlorothiazide.  The patient  remains on Glucophage for elevated blood sugars of 121-141 measured a.c. and  h.s..   The patient was evaluated by the rehabilitation physicians and felt to be an  appropriate candidate for inpatient rehabilitation.   REVIEW OF SYSTEMS:  Positive for reflux.   PAST MEDICAL HISTORY:  1. Non-insulin-dependent diabetes mellitus.  2. Hypertension.  3. Dyslipidemia.  4. Prior tubal ligation.   FAMILY HISTORY:  Positive for hypertension.   SOCIAL HISTORY:  The patient lives with her husband and daughter along with  a 15 year old granddaughter.  The daughter works day shift and the husband  works nightshift, although he had been retired.  They live in a 1-level  home  with two steps to enter.  The 75 year old granddaughter has not yet returned  to the school after summer break.  The patient does not use alcohol, but  reports that she does dip snuff.   FUNCTIONAL HISTORY PRIOR TO ADMISSION:  Independent.   ALLERGIES:  NO KNOWN DRUG ALLERGIES.   MEDICATIONS PRIOR TO ADMISSION:  1. Simvastatin 20 mg.  2. Lisinopril 20/25, 1 tablet b.i.d.  3. Verapamil 180 mg daily.  4. Metformin 500 mg  __________.  5. Glipizide 10 mg daily.   LABORATORY DATA:  Recent labs showed hemoglobin of 12.7, hematocrit 38.2,  platelet count 242,000 and white count of 6.6.  Recent cholesterol was 144  with a HbA1c of 7.5.  Recent sodium was 139, potassium 4.3, chloride 106,  CO2 of 27, BUN 11, and creatinine 1.1 with CBGs of 121 and 134.   PHYSICAL EXAMINATION:  GENERAL:  A well-appearing fit elderly female in no  acute discomfort.  VITAL SIGNS:  Blood pressure 128/68, pulse 60, respiratory rate 18,  temperature 98.6.  HEENT:  Normocephalic and atraumatic.  CARDIOVASCULAR:  Regular rate and rhythm.  S1 and  S2 without murmurs.  ABDOMEN:  Soft and nontender with positive bowel sounds.  LUNGS:  Clear to auscultation bilaterally.  NEUROLOGICAL:  Alert and oriented times three.  Cranial nerves II-XII were  intact.  Bilateral upper extremity exam showed 5/5 strength in the left  upper extremity and 3-/5 strength in the right upper extremity.  She has  slightly increased strength proximally compared to distally, where grip  strength was 0/5.  Sensation was intact to light touch throughout the  bilateral upper extremities.  Bulk and tone were normal on the left side,  and tone was decreased on the right side.  Reflexes were 0-1 in the right  upper extremity.  Lower extremity exam showed 5/5 strength in the left lower  extremity and 4+/5 strength in the right lower extremity.  Bulk and tone  were normal and reflexes were 2+ and symmetrical.  Sensation was intact to  light  touch throughout the bilateral lower extremities.  Patient did have  moderate aphasia with difficulty with word finding on an occasional basis.  She was able to answer yes and no questions very appropriately.   IMPRESSION:  Status post left posterior frontal infarct with right-sided  hemiparesis, arm greater than left, along with moderate aphasia.   Apparently, the patient has deficits in ADLs, transfers, and ambulation,  secondary to her left posterior frontal infarct with right-sided weakness.   PLAN:  1. Admit to the rehabilitation unit for daily OT, PT, and speech therapy      and to advance ADLs, transfers, ambulation, and speech abilities as      able.  2. Check admission labs including CBC and CMET on Friday, February 16, 2006.  3. 24-hour nursing for medication administration and monitoring of vitals      along with CBG testing.  4. Continue high-modified carbohydrate diet.  5. Continue aspirin 81 mg p.o. daily and Aggrenox 1 tablet daily until      March 07, 2006 and then increase to 1 Aggrenox p.o. b.i.d..  6. Monitor hypertension on lisinopril 20 mg b.i.d. along with      hydrochlorothiazide 25 mg b.i.d. and verapamil 180 mg daily.  7. Check a.c. and h.s. blood sugars on Glucophage 500 mg b.i.d. and      glipizide 10 mg daily.  8. Continue Zocor 20 mg daily for dyslipidemia.  9. Add Lovenox 40 mg subcutaneously daily for DVT prophylaxis.   PROGNOSIS:  Good.   ESTIMATED LENGTH OF STAY:  5-10 days.   GOALS:  Modified independent transfers and ambulation and standby to mid-  assist with ADLs.           ______________________________  Ellwood Dense, M.D.     DC/MEDQ  D:  02/23/2006  T:  02/23/2006  Job:  [id number]

## 2010-12-03 NOTE — Assessment & Plan Note (Signed)
She returns today initially scheduled for botulinum toxin injection to her  lumbricals and right adductor pollicus. She has fortunately gotten some  improvement and continues to receive OT services. She has had no new medical  problems in the interval time.   Her blood pressure is 141/58, pulse 90, respirations 16, O2 saturation 100%  on room air.  Her upper extremity tone shows Ashworth grade 1 at the thumb flexor and at  the finger flexors as well as wrist flexors. Her right shoulder has some  pain and positive impingement testing. This does improve somewhat with  gentle range of motion. She has good range of motion in external rotation as  well as abduction of the right shoulder.   IMPRESSION:  Right spastic hemiplegia due to left cerebral thrombosis,  parietal infarct.   PLAN:  1. Will continue with outpatient OT.  2. Hold off on botulinum toxin injection at the current time.  3. I will see her back in about six weeks, and if she is doing well at      that time, she can just follow up with neurology as well as primary.      Erick Colace, M.D.  Electronically Signed     AEK/MedQ  D:  05/15/2006 13:36:32  T:  05/16/2006 07:50:11  Job #:  [id number]   cc:   Pramod P. Pearlean Brownie, MD  Fax: [phone]   Kittie Plater

## 2010-12-03 NOTE — Assessment & Plan Note (Signed)
REASON FOR VISIT:  CVA with right spastic hemiplegia, last seen August 07, 2006.  Patient now has completed physical therapy working on PIP and  DIP range of motion, finished up with paraffin bath, still doing  stretching at home.  She would like to resume driving.  She has had no  seizures, she has had no visual disturbance, has not seen her eye doctor  for a while but reports no visual difficulties.  She is independent with  all her self-care.  She transfers herself independently without  difficulty, no assistive device is required.  She has no limitations in  terms of her ambulation.  She does few household chores but she states  her children do not let her do as much as she would like.   REVIEW OF SYSTEMS:  Negative.   PHYSICAL EXAMINATION:  GENERAL APPEARANCE:  No acute distress.  Mood and  affect appropriate.  Well-developed, well-nourished female.  VITAL SIGNS:  Blood pressure 137/63, pulse 75, respiratory rate 16, O2  saturation 98% on room air.  NEUROLOGIC:  Orientation x3.  Affect bright and alert.  Gait is normal.  Right hand has extensor contractures in the second and third digit  DIP/PIP.  Less so in the PCP.  She is in Dupuytren's contracture right  fifth digit.  Fourth digit has DIP, PCP and PIP contractures and  extension as well.  Thumb has good range of motion.  She is able to  oppose second digit to thumb on the right hand but no other fingers.   Shoulder, elbow and wrist range of motion is full in the right upper  extremity.  She has normal strength in deltoid, biceps, triceps and only  3- at the grip due to range of motion difficulties.   Left upper extremity has full strength.  Bilateral lower extremity has  full strength.  Gait is normal.   IMPRESSION:  Right upper extremity finger contractures causing decreased  grip.  The remainder of her neurologic exam in terms of motor strength  is normal in the remainder of the upper extremity.  She has no  significant sensory difficulties.  Her visual fields are intact and her  extraocular muscles are normal.   PLAN:  1. I think she can continue her home exercise program.  2. She may resume driving on a limited basis, went over how she can go      out with her daughter in the parking lot that is empty and practice      starting, stopping, turning and proceed on to a noncongested side      street in daytime hours and if she does well with that supervised,      try it independently.  No night time driving, no interstate      driving.  Both she and her daughter, who was present, agreed to      this plan.  3. I will see the patient back on a p.r.n. basis.  She will follow up      with Dr. Felecia Shelling for her diabetes, hypertension and hyperlipidemia.      Erick Colace, M.D.  Electronically Signed     AEK/MedQ  D:  09/26/2006 16:26:18  T:  09/28/2006 12:49:48  Job #:  [id number]   cc:   Tesfaye D. Felecia Shelling, MD  Fax: [phone]   Pramod P. Pearlean Brownie, MD  Fax: [phone]

## 2010-12-03 NOTE — Assessment & Plan Note (Signed)
A 77 year old African-American female admitted to [facility] February 20, 2006 with right-sided weakness and aphasia.  MRI showed a  left posterior frontal infarct.  She did not receive tPA.  Carotid Dopplers  were negative.  MRA showed a 50-60% basilar artery stenosis.  Echocardiogram  had a normal ejection fraction and no source of embolus identified.  She was  seen by Dr. Pearlean Brownie.  She was placed on aspirin and Aggrenox for CVA  prophylaxis.  She has had some ongoing problems with nausea.  She did have  headaches while inpatient and has been using Tylenol in conjunction with her  Aggrenox for the headaches.   PAST MEDICAL HISTORY:  1. Diabetes, noninsulin-dependent.  2. Hypertension.  3. Hyperlipidemia.   PRIMARY CARE PHYSICIAN:  Dr. Felecia Shelling.   CURRENT MEDICATIONS:  1. Zocor 20 mg daily.  2. Glipizide 10 mg daily.  3. Lisinopril 20 mg p.o. b.i.d.  4. Verapamil 180 p.o. daily.  5. Glucophage 500 daily.  6. Aspirin 81 mg daily.  7. Aggrenox 1 p.o. b.i.d.  8. Hydrochlorothiazide 25 mg p.o. daily.   DIET:  Diabetic.   Please see health and history form for full review of systems.  She has no  significant pain.  She has 4/5 strength in the right deltoid and  biceps/triceps, 2- in finger flexors and finger extensors on the right hand  including the thumb.  She has mildly decreased sensation in the right hand  compared to the left.  Left upper extremity and left lower extremity are  5/5.  Right lower extremity is 5/5.  Gait is normal.  Her tone is increased  in the left hand.  She has problems with release of grasp and she namely  uses a three-point chuck-type grasp.  She has increased tone at the  lumbrical as well as adductor pollicis.   IMPRESSION:  Left parietal infarct causing right spastic hemiparesis.  She  has mainly focal spasticity involving the intrinsic hand muscles and  potentially some lesser problems at the wrist flexors.   PLAN:  1. Recommend Botox  injection given the focal nature of this, 100 units      should suffice.  2. Continue OT.  3. Follow up with neurology for secondary stroke prophylaxis.  4. Follow up with primary physician regarding hyperlipidemia, hypertension      and diabetes.  I will see her back for the injection in two to three      weeks.      Erick Colace, M.D.  Electronically Signed     AEK/MedQ  D:  03/28/2006 13:36:26  T:  03/29/2006 01:09:55  Job #:  [id number]   cc:   Pramod P. Pearlean Brownie, MD  Fax: [phone]   Tesfaye D. Felecia Shelling, MD  Fax: [phone]

## 2010-12-23 ENCOUNTER — Ambulatory Visit (INDEPENDENT_AMBULATORY_CARE_PROVIDER_SITE_OTHER): Payer: Medicare Other | Admitting: *Deleted

## 2010-12-23 DIAGNOSIS — I4891 Unspecified atrial fibrillation: Secondary | ICD-10-CM

## 2010-12-23 DIAGNOSIS — Z7901 Long term (current) use of anticoagulants: Secondary | ICD-10-CM

## 2010-12-26 ENCOUNTER — Other Ambulatory Visit: Payer: Self-pay | Admitting: Cardiology

## 2011-01-24 ENCOUNTER — Ambulatory Visit (INDEPENDENT_AMBULATORY_CARE_PROVIDER_SITE_OTHER): Payer: Medicare Other | Admitting: *Deleted

## 2011-01-24 DIAGNOSIS — I4891 Unspecified atrial fibrillation: Secondary | ICD-10-CM

## 2011-01-24 DIAGNOSIS — Z7901 Long term (current) use of anticoagulants: Secondary | ICD-10-CM

## 2011-02-19 ENCOUNTER — Other Ambulatory Visit: Payer: Self-pay | Admitting: Cardiology

## 2011-02-23 ENCOUNTER — Encounter: Payer: Medicare Other | Admitting: *Deleted

## 2011-02-24 ENCOUNTER — Ambulatory Visit (INDEPENDENT_AMBULATORY_CARE_PROVIDER_SITE_OTHER): Payer: Medicare Other | Admitting: *Deleted

## 2011-02-24 DIAGNOSIS — Z7901 Long term (current) use of anticoagulants: Secondary | ICD-10-CM

## 2011-02-24 DIAGNOSIS — I4891 Unspecified atrial fibrillation: Secondary | ICD-10-CM

## 2011-03-14 ENCOUNTER — Other Ambulatory Visit: Payer: Self-pay | Admitting: Cardiology

## 2011-03-17 ENCOUNTER — Ambulatory Visit (INDEPENDENT_AMBULATORY_CARE_PROVIDER_SITE_OTHER): Payer: Medicare Other | Admitting: *Deleted

## 2011-03-17 DIAGNOSIS — Z7901 Long term (current) use of anticoagulants: Secondary | ICD-10-CM

## 2011-03-17 DIAGNOSIS — I4891 Unspecified atrial fibrillation: Secondary | ICD-10-CM

## 2011-04-05 ENCOUNTER — Encounter (HOSPITAL_COMMUNITY): Payer: Medicare Other | Attending: Oncology | Admitting: Oncology

## 2011-04-05 ENCOUNTER — Other Ambulatory Visit (HOSPITAL_COMMUNITY): Payer: Self-pay | Admitting: *Deleted

## 2011-04-05 ENCOUNTER — Other Ambulatory Visit (HOSPITAL_COMMUNITY): Payer: Self-pay | Admitting: Oncology

## 2011-04-05 DIAGNOSIS — E119 Type 2 diabetes mellitus without complications: Secondary | ICD-10-CM | POA: Insufficient documentation

## 2011-04-05 DIAGNOSIS — Z85038 Personal history of other malignant neoplasm of large intestine: Secondary | ICD-10-CM | POA: Insufficient documentation

## 2011-04-05 DIAGNOSIS — Z79899 Other long term (current) drug therapy: Secondary | ICD-10-CM | POA: Insufficient documentation

## 2011-04-05 DIAGNOSIS — G609 Hereditary and idiopathic neuropathy, unspecified: Secondary | ICD-10-CM | POA: Insufficient documentation

## 2011-04-05 DIAGNOSIS — Z09 Encounter for follow-up examination after completed treatment for conditions other than malignant neoplasm: Secondary | ICD-10-CM | POA: Insufficient documentation

## 2011-04-05 DIAGNOSIS — C189 Malignant neoplasm of colon, unspecified: Secondary | ICD-10-CM

## 2011-04-05 DIAGNOSIS — C180 Malignant neoplasm of cecum: Secondary | ICD-10-CM

## 2011-04-05 LAB — COMPREHENSIVE METABOLIC PANEL
ALT: 8 U/L (ref 0–35)
Alkaline Phosphatase: 174 U/L — ABNORMAL HIGH (ref 39–117)
BUN: 13 mg/dL (ref 6–23)
CO2: 28 mEq/L (ref 19–32)
Calcium: 9.4 mg/dL (ref 8.4–10.5)
GFR calc Af Amer: >60 mL/min (ref >60)
GFR calc non Af Amer: 57 mL/min — ABNORMAL LOW (ref >60)
Glucose, Bld: 125 mg/dL — ABNORMAL HIGH (ref 70–99)
Sodium: 138 mEq/L (ref 135–145)
Total Protein: 7.9 g/dL (ref 6.0–8.3)

## 2011-04-05 LAB — CBC
HCT: 40.1 % (ref 36.0–46.0)
Hemoglobin: 13.1 g/dL (ref 12.0–15.0)
MCH: 28.3 pg (ref 26.0–34.0)
MCHC: 32.7 g/dL (ref 30.0–36.0)
MCV: 86.6 fL (ref 78.0–100.0)
RBC: 4.63 MIL/uL (ref 3.87–5.11)

## 2011-04-05 NOTE — Progress Notes (Signed)
This office note has been dictated.

## 2011-04-05 NOTE — Progress Notes (Signed)
DIAGNOSES: 1. Adenocarcinoma of the colon, status post surgical resection in June     2009.  She had a right hemicolectomy and was found to have a stage     IIA cancer, not requiring chemotherapy at that time. 2. History of atrial fibrillation. 3. History of chronic snuff dipping with her teeth having been removed     years ago. 4. Diabetes mellitus. 5. Peripheral neuropathy, which is grade 1 at this time in both lower     extremities. 6. Hypertension, on therapy  SUBJECTIVE:  Katrina Arnold really wanted to talk about her son, and he is a patient of ours with cancer as well.  She is worried about it, but she notices that he has done much better since he has responded to the chemotherapy, is very, very active, etc.  In talking to her she is up-to- date on her colonoscopies.  I do think one is due until the end of this year.  Her last colonoscopy was actually December 2010.  She herself has vital signs which are stable 163.8 pounds, blood pressure is 114/69.  She is afebrile, pulse right around 100 and regular, respirations 16 and unlabored.  MEDICATIONS:  Her medications today include simvastatin 20 mg a day, omeprazole 20 mg a day, Norvasc 10 mg a day, metoprolol 50 mg b.i.d., metformin 500 mg b.i.d., glipizide 2.5 mg daily, and potassium 20 mg once a day. Coumadin is being managed by Dr. Dietrich Pates for the atrial fibrillation.  Her iron is over-the-counter she takes once a day, and HCTZ 12.5 mg once a day.  PHYSICAL EXAMINATION:  Her exam otherwise shows again no abdominal findings.  No hepatosplenomegaly.  No distension.  She has no edema, but she has symptoms and describes peripheral neuropathy quite readily in a stocking distribution.  Slightly worse on the right leg than the left. Her lymph node exam is negative throughout.  Breast exam is negative bilaterally.  Her heart shows no obvious murmur, rub, or gallop.  Fairly regular rhythm at this time, though it is occasionally  irregular consistent with her history.  Her lungs are clear.  ASSESSMENT AND PLAN:  Katrina Arnold will come back in 6 months.  She will continue CEAs every 12 weeks.  We will see her in 6 months for followup.    ______________________________ Ladona Horns. Mariel Sleet, MD ESN/MEDQ  D:  04/05/2011  T:  04/05/2011  Job:  [id number]

## 2011-04-09 ENCOUNTER — Other Ambulatory Visit: Payer: Self-pay | Admitting: Cardiology

## 2011-04-13 ENCOUNTER — Ambulatory Visit (INDEPENDENT_AMBULATORY_CARE_PROVIDER_SITE_OTHER): Payer: Medicare Other | Admitting: *Deleted

## 2011-04-13 DIAGNOSIS — Z7901 Long term (current) use of anticoagulants: Secondary | ICD-10-CM

## 2011-04-13 DIAGNOSIS — I4891 Unspecified atrial fibrillation: Secondary | ICD-10-CM

## 2011-04-13 LAB — POCT INR: INR: 2.8

## 2011-04-14 LAB — DIFFERENTIAL
Basophils Absolute: 0
Basophils Absolute: 0
Basophils Absolute: 0
Basophils Relative: 0
Basophils Relative: 0
Basophils Relative: 0
Basophils Relative: 1
Basophils Relative: 1
Eosinophils Absolute: 0.1
Eosinophils Absolute: 0.1
Eosinophils Absolute: 0.2
Eosinophils Relative: 2
Eosinophils Relative: 2
Eosinophils Relative: 3
Lymphocytes Relative: 33
Lymphocytes Relative: 34
Lymphocytes Relative: 6 — ABNORMAL LOW
Lymphocytes Relative: 24
Lymphs Abs: 1
Lymphs Abs: 2
Lymphs Abs: 2.3
Lymphs Abs: 2
Monocytes Absolute: 0.7
Monocytes Absolute: 0.8
Monocytes Absolute: 0.8
Monocytes Absolute: 0.8
Monocytes Relative: 10
Monocytes Relative: 10
Monocytes Relative: 11
Monocytes Relative: 8
Monocytes Relative: 8
Monocytes Relative: 8
Monocytes Relative: 10
Neutro Abs: 3.8
Neutro Abs: 4.4
Neutro Abs: 5.7
Neutro Abs: 7.6
Neutrophils Relative %: 54
Neutrophils Relative %: 55
Neutrophils Relative %: 60
Neutrophils Relative %: 70
Neutrophils Relative %: 70
Neutrophils Relative %: 80 — ABNORMAL HIGH
Neutrophils Relative %: 86 — ABNORMAL HIGH

## 2011-04-14 LAB — CBC
HCT: 28.5 — ABNORMAL LOW
HCT: 29.5 — ABNORMAL LOW
HCT: 31.6 — ABNORMAL LOW
HCT: 31.7 — ABNORMAL LOW
Hemoglobin: 10.7 — ABNORMAL LOW
Hemoglobin: 10.8 — ABNORMAL LOW
Hemoglobin: 10.8 — ABNORMAL LOW
Hemoglobin: 11.2 — ABNORMAL LOW
Hemoglobin: 9.4 — ABNORMAL LOW
Hemoglobin: 9.7 — ABNORMAL LOW
MCHC: 32.8
MCHC: 33.3
MCHC: 33.5
MCHC: 33.5
MCHC: 33.8
MCV: 77.7 — ABNORMAL LOW
MCV: 78
MCV: 78.7
MCV: 79.3
Platelets: 312
Platelets: 314
Platelets: 322
Platelets: 305
RBC: 3.59 — ABNORMAL LOW
RBC: 3.67 — ABNORMAL LOW
RBC: 4.02
RBC: 4.08
RBC: 4.3
RDW: 20.3 — ABNORMAL HIGH
RDW: 20.4 — ABNORMAL HIGH
RDW: 20.6 — ABNORMAL HIGH
RDW: 20.1 — ABNORMAL HIGH
WBC: 13 — ABNORMAL HIGH
WBC: 7
WBC: 7.4
WBC: 8.2
WBC: 8.1

## 2011-04-14 LAB — COMPREHENSIVE METABOLIC PANEL
ALT: 10
ALT: 8
AST: 14
Albumin: 2.6 — ABNORMAL LOW
Albumin: 3.5
Albumin: 2.7 — ABNORMAL LOW
Alkaline Phosphatase: 78
Alkaline Phosphatase: 86
Alkaline Phosphatase: 92
BUN: 2 — ABNORMAL LOW
BUN: 3 — ABNORMAL LOW
CO2: 30
CO2: 30
Calcium: 8.7
Calcium: 8.8
Calcium: 9
Chloride: 100
Chloride: 104
Chloride: 101
Creatinine, Ser: 1.03
Creatinine, Ser: 0.9
GFR calc Af Amer: 60 — ABNORMAL LOW
GFR calc Af Amer: >60
GFR calc Af Amer: >60
GFR calc non Af Amer: 53 — ABNORMAL LOW
GFR calc non Af Amer: 58 — ABNORMAL LOW
Glucose, Bld: 145 — ABNORMAL HIGH
Glucose, Bld: 156 — ABNORMAL HIGH
Glucose, Bld: 175 — ABNORMAL HIGH
Glucose, Bld: 188 — ABNORMAL HIGH
Potassium: 3 — ABNORMAL LOW
Potassium: 3.7
Potassium: 4.3
Sodium: 136
Sodium: 137
Sodium: 138
Sodium: 138
Total Bilirubin: 0.3
Total Bilirubin: 0.6
Total Protein: 5.5 — ABNORMAL LOW
Total Protein: 6
Total Protein: 6.2
Total Protein: 6.7
Total Protein: 6.2

## 2011-04-14 LAB — IRON AND TIBC
Iron: 21 — ABNORMAL LOW
Saturation Ratios: 6 — ABNORMAL LOW
TIBC: 378
UIBC: 357

## 2011-04-14 LAB — RETICULOCYTES
RBC.: 3.07 — ABNORMAL LOW
Retic Count, Absolute: 70.6
Retic Ct Pct: 2.3

## 2011-04-14 LAB — PHOSPHORUS
Phosphorus: 1.9 — ABNORMAL LOW
Phosphorus: 2.5
Phosphorus: 3.7

## 2011-04-14 LAB — BASIC METABOLIC PANEL
BUN: 4 — ABNORMAL LOW
CO2: 34 — ABNORMAL HIGH
Chloride: 101
Glucose, Bld: 101 — ABNORMAL HIGH
Potassium: 4
Sodium: 140

## 2011-04-14 LAB — CROSSMATCH: Antibody Screen: NEGATIVE

## 2011-04-14 LAB — MAGNESIUM
Magnesium: 1.3 — ABNORMAL LOW
Magnesium: 1.7

## 2011-04-14 LAB — FERRITIN: Ferritin: 9 — ABNORMAL LOW (ref 10–291)

## 2011-04-14 LAB — VITAMIN B12: Vitamin B-12: 219 (ref 211–911)

## 2011-04-14 LAB — CEA: CEA: 2.8

## 2011-04-14 LAB — FOLATE: Folate: 18.3

## 2011-04-18 LAB — CEA: CEA: 4.4

## 2011-04-22 LAB — CEA: CEA: 4.4 ng/mL (ref 0.0–5.0)

## 2011-05-11 ENCOUNTER — Ambulatory Visit (INDEPENDENT_AMBULATORY_CARE_PROVIDER_SITE_OTHER): Payer: Medicare Other | Admitting: *Deleted

## 2011-05-11 DIAGNOSIS — Z7901 Long term (current) use of anticoagulants: Secondary | ICD-10-CM

## 2011-05-11 DIAGNOSIS — I4891 Unspecified atrial fibrillation: Secondary | ICD-10-CM

## 2011-05-27 ENCOUNTER — Other Ambulatory Visit: Payer: Self-pay | Admitting: Cardiology

## 2011-06-08 ENCOUNTER — Ambulatory Visit (INDEPENDENT_AMBULATORY_CARE_PROVIDER_SITE_OTHER): Payer: Medicare Other | Admitting: *Deleted

## 2011-06-08 DIAGNOSIS — I4891 Unspecified atrial fibrillation: Secondary | ICD-10-CM

## 2011-06-08 DIAGNOSIS — Z7901 Long term (current) use of anticoagulants: Secondary | ICD-10-CM

## 2011-06-08 LAB — POCT INR: INR: 1.9

## 2011-06-29 ENCOUNTER — Ambulatory Visit (INDEPENDENT_AMBULATORY_CARE_PROVIDER_SITE_OTHER): Payer: Medicare Other | Admitting: *Deleted

## 2011-06-29 DIAGNOSIS — Z7901 Long term (current) use of anticoagulants: Secondary | ICD-10-CM

## 2011-06-29 DIAGNOSIS — I4891 Unspecified atrial fibrillation: Secondary | ICD-10-CM

## 2011-06-29 LAB — POCT INR: INR: 2.2

## 2011-06-30 ENCOUNTER — Encounter: Payer: Self-pay | Admitting: Cardiology

## 2011-07-05 ENCOUNTER — Encounter (HOSPITAL_COMMUNITY): Payer: Medicare Other | Attending: Oncology

## 2011-07-05 DIAGNOSIS — C180 Malignant neoplasm of cecum: Secondary | ICD-10-CM

## 2011-07-05 DIAGNOSIS — Z85038 Personal history of other malignant neoplasm of large intestine: Secondary | ICD-10-CM | POA: Insufficient documentation

## 2011-07-05 NOTE — Progress Notes (Signed)
Labs drawn today for cea 

## 2011-07-28 ENCOUNTER — Ambulatory Visit (INDEPENDENT_AMBULATORY_CARE_PROVIDER_SITE_OTHER): Payer: Medicare Other | Admitting: *Deleted

## 2011-07-28 DIAGNOSIS — I4891 Unspecified atrial fibrillation: Secondary | ICD-10-CM

## 2011-07-28 DIAGNOSIS — Z7901 Long term (current) use of anticoagulants: Secondary | ICD-10-CM

## 2011-07-28 LAB — POCT INR: INR: 2.5

## 2011-07-31 ENCOUNTER — Other Ambulatory Visit: Payer: Self-pay | Admitting: Cardiology

## 2011-08-02 ENCOUNTER — Other Ambulatory Visit: Payer: Self-pay | Admitting: *Deleted

## 2011-08-02 MED ORDER — WARFARIN SODIUM 5 MG PO TABS: 5.0000 mg | ORAL_TABLET | ORAL | Status: DC

## 2011-08-18 ENCOUNTER — Other Ambulatory Visit: Payer: Self-pay | Admitting: Cardiology

## 2011-08-21 ENCOUNTER — Encounter: Payer: Self-pay | Admitting: Cardiology

## 2011-08-22 ENCOUNTER — Ambulatory Visit (INDEPENDENT_AMBULATORY_CARE_PROVIDER_SITE_OTHER): Payer: Medicare Other | Admitting: Cardiology

## 2011-08-22 ENCOUNTER — Encounter: Payer: Self-pay | Admitting: Cardiology

## 2011-08-22 ENCOUNTER — Ambulatory Visit (INDEPENDENT_AMBULATORY_CARE_PROVIDER_SITE_OTHER): Payer: Medicare Other | Admitting: *Deleted

## 2011-08-22 DIAGNOSIS — Z7901 Long term (current) use of anticoagulants: Secondary | ICD-10-CM

## 2011-08-22 DIAGNOSIS — E119 Type 2 diabetes mellitus without complications: Secondary | ICD-10-CM

## 2011-08-22 DIAGNOSIS — I10 Essential (primary) hypertension: Secondary | ICD-10-CM

## 2011-08-22 DIAGNOSIS — I4891 Unspecified atrial fibrillation: Secondary | ICD-10-CM

## 2011-08-22 DIAGNOSIS — N182 Chronic kidney disease, stage 2 (mild): Secondary | ICD-10-CM

## 2011-08-22 DIAGNOSIS — D649 Anemia, unspecified: Secondary | ICD-10-CM

## 2011-08-22 DIAGNOSIS — E785 Hyperlipidemia, unspecified: Secondary | ICD-10-CM

## 2011-08-22 DIAGNOSIS — E782 Mixed hyperlipidemia: Secondary | ICD-10-CM

## 2011-08-22 LAB — POCT INR: INR: 2.8

## 2011-08-22 NOTE — Assessment & Plan Note (Signed)
Excellent control of hyperlipidemia with current therapy, which will be continued. 

## 2011-08-22 NOTE — Patient Instructions (Signed)
Your physician recommends that you schedule a follow-up appointment in: 1 year  Your physician recommends that you return for lab work in: This week

## 2011-08-22 NOTE — Assessment & Plan Note (Signed)
Renal function has apparently normalized.

## 2011-08-22 NOTE — Assessment & Plan Note (Signed)
Atrial fibrillation is now persistent or permanent.  Control of heart rate is good.  Anticoagulation is required and will be continued.

## 2011-08-22 NOTE — Assessment & Plan Note (Signed)
Blood pressure control is adequate; current medications will be continued. 

## 2011-08-22 NOTE — Progress Notes (Signed)
Patient ID: Katrina Arnold, female   DOB: 1933/04/04, 78 y.o.   MRN: [id number] HPI: Scheduled return visit for this very nice woman with chronic atrial fibrillation requiring anticoagulation.  Since her last visit more than 18 months ago, she has done quite well.  She has not been hospitalized or required urgent medical treatment.  Control of diabetes and hypertension has been good.    Prior to Admission medications   Medication Sig Start Date End Date Taking? Authorizing Provider  acetaminophen (TYLENOL) 500 MG tablet Take 500 mg by mouth as needed.     Yes Historical Provider, MD  amLODipine (NORVASC) 10 MG tablet Take 10 mg by mouth daily.     Yes Historical Provider, MD  ferrous sulfate 325 (65 FE) MG tablet Take 325 mg by mouth daily with breakfast.     Yes Historical Provider, MD  glipiZIDE (GLUCOTROL XL) 2.5 MG 24 hr tablet Take 2.5 mg by mouth daily.     Yes Historical Provider, MD  hydrochlorothiazide (HYDRODIURIL) 25 MG tablet Take 12.5 mg by mouth daily.     Yes Historical Provider, MD  metFORMIN (GLUCOPHAGE) 500 MG tablet Take 500 mg by mouth 2 (two) times daily with a meal.     Yes Historical Provider, MD  metoprolol (LOPRESSOR) 50 MG tablet Take 50 mg by mouth 2 (two) times daily.     Yes Historical Provider, MD  omeprazole (PRILOSEC) 20 MG capsule Take 20 mg by mouth daily.     Yes Historical Provider, MD  potassium chloride SA (K-DUR,KLOR-CON) 20 MEQ tablet TAKE 1 TABLET BY MOUTH EVERY DAY 08/18/11  Yes Valera Castle, MD  simvastatin (ZOCOR) 20 MG tablet Take 20 mg by mouth at bedtime.     Yes Historical Provider, MD  warfarin (COUMADIN) 5 MG tablet Take 1 tablet (5 mg total) by mouth as directed. 08/02/11  Yes Gerrit Friends. Dietrich Pates, MD  No Known Allergies    Past medical history, social history, and family history reviewed and updated.  ROS: Denies chest discomfort, palpitations, lightheadedness, syncope, falls, chest discomfort or dyspnea.  She notes intermittent mild pedal  edema.  PHYSICAL EXAM: BP 139/81  Pulse 81  Resp 16  Ht 5\' 3"  (1.6 m)  Wt 73.029 kg (161 lb)  BMI 28.52 kg/m2  General-Well developed; no acute distress Body habitus-mildly overweight Neck-No JVD; no carotid bruits Lungs-clear lung fields; resonant to percussion Cardiovascular-normal PMI; normal S1 and S2; irregular rhythm Abdomen-normal bowel sounds; soft and non-tender without masses or organomegaly Musculoskeletal-No deformities, no cyanosis or clubbing Neurologic-Normal cranial nerves; symmetric strength and tone Skin-Warm, no significant lesions Extremities-distal lower extremity pulses intact; right radial pulse is decreased although blood pressure in the right arm is equal to that in the last. No edema  ASSESSMENT AND PLAN:  Economy Bing, MD 08/22/2011 2:59 PM

## 2011-08-22 NOTE — Assessment & Plan Note (Signed)
Patient's PCP is managing this issue.

## 2011-08-22 NOTE — Assessment & Plan Note (Signed)
Anemia appeared to be related to carcinoma of the colon and had resolved as of 03/2011.  A repeat CBC will be checked.

## 2011-08-22 NOTE — Assessment & Plan Note (Signed)
INRs have been stable and therapeutic.  CBC normal in 03/2011.  Stool for Hemoccult testing never previously obtained-3 samples have been requested.

## 2011-08-25 ENCOUNTER — Encounter: Payer: Medicare Other | Admitting: *Deleted

## 2011-08-30 ENCOUNTER — Encounter: Payer: Self-pay | Admitting: *Deleted

## 2011-09-07 ENCOUNTER — Other Ambulatory Visit: Payer: Self-pay | Admitting: Cardiology

## 2011-09-12 ENCOUNTER — Other Ambulatory Visit: Payer: Self-pay | Admitting: Cardiology

## 2011-09-13 LAB — COMPREHENSIVE METABOLIC PANEL
ALT: 8 U/L (ref 0–35)
AST: 14 U/L (ref 0–37)
Albumin: 3.9 g/dL (ref 3.5–5.2)
BUN: 13 mg/dL (ref 6–23)
CO2: 23 mEq/L (ref 19–32)
Calcium: 8.7 mg/dL (ref 8.4–10.5)
Chloride: 105 mEq/L (ref 96–112)
Potassium: 3.6 mEq/L (ref 3.5–5.3)

## 2011-09-13 LAB — HEMOGLOBIN A1C
Hgb A1c MFr Bld: 7.7 % — ABNORMAL HIGH (ref <5.7)
Mean Plasma Glucose: 174 mg/dL — ABNORMAL HIGH (ref <117)

## 2011-09-13 LAB — LIPID PANEL
Cholesterol: 125 mg/dL (ref 0–200)
HDL: 45 mg/dL (ref >39)

## 2011-09-19 ENCOUNTER — Encounter: Payer: Self-pay | Admitting: *Deleted

## 2011-09-26 ENCOUNTER — Other Ambulatory Visit: Payer: Self-pay | Admitting: Cardiology

## 2011-09-26 ENCOUNTER — Ambulatory Visit (INDEPENDENT_AMBULATORY_CARE_PROVIDER_SITE_OTHER): Payer: Medicare Other | Admitting: *Deleted

## 2011-09-26 DIAGNOSIS — Z7901 Long term (current) use of anticoagulants: Secondary | ICD-10-CM

## 2011-09-26 DIAGNOSIS — I4891 Unspecified atrial fibrillation: Secondary | ICD-10-CM

## 2011-10-03 ENCOUNTER — Encounter (HOSPITAL_COMMUNITY): Payer: Medicare Other | Attending: Oncology

## 2011-10-03 DIAGNOSIS — C180 Malignant neoplasm of cecum: Secondary | ICD-10-CM | POA: Insufficient documentation

## 2011-10-03 LAB — CEA: CEA: 2.6 ng/mL (ref 0.0–5.0)

## 2011-10-03 NOTE — Progress Notes (Signed)
Lab draw

## 2011-10-17 ENCOUNTER — Ambulatory Visit (INDEPENDENT_AMBULATORY_CARE_PROVIDER_SITE_OTHER): Payer: Medicare Other | Admitting: *Deleted

## 2011-10-17 DIAGNOSIS — I4891 Unspecified atrial fibrillation: Secondary | ICD-10-CM

## 2011-10-17 DIAGNOSIS — Z7901 Long term (current) use of anticoagulants: Secondary | ICD-10-CM

## 2011-10-17 LAB — POCT INR: INR: 3.5

## 2011-11-04 ENCOUNTER — Other Ambulatory Visit: Payer: Self-pay | Admitting: Cardiology

## 2011-11-14 ENCOUNTER — Ambulatory Visit (INDEPENDENT_AMBULATORY_CARE_PROVIDER_SITE_OTHER): Payer: Medicare Other | Admitting: *Deleted

## 2011-11-14 DIAGNOSIS — Z7901 Long term (current) use of anticoagulants: Secondary | ICD-10-CM

## 2011-11-14 DIAGNOSIS — I4891 Unspecified atrial fibrillation: Secondary | ICD-10-CM

## 2011-11-14 LAB — POCT INR: INR: 2.6

## 2011-12-20 ENCOUNTER — Other Ambulatory Visit: Payer: Self-pay | Admitting: Cardiology

## 2011-12-22 ENCOUNTER — Ambulatory Visit (INDEPENDENT_AMBULATORY_CARE_PROVIDER_SITE_OTHER): Payer: Medicare Other | Admitting: *Deleted

## 2011-12-22 DIAGNOSIS — Z7901 Long term (current) use of anticoagulants: Secondary | ICD-10-CM

## 2011-12-22 DIAGNOSIS — I4891 Unspecified atrial fibrillation: Secondary | ICD-10-CM

## 2011-12-22 LAB — POCT INR: INR: 2.2

## 2012-01-03 ENCOUNTER — Encounter (HOSPITAL_COMMUNITY): Payer: Medicare Other | Attending: Oncology

## 2012-01-03 DIAGNOSIS — C180 Malignant neoplasm of cecum: Secondary | ICD-10-CM | POA: Insufficient documentation

## 2012-01-03 NOTE — Progress Notes (Signed)
Labs drawn today for cea 

## 2012-01-04 LAB — CEA: CEA: 3 ng/mL (ref 0.0–5.0)

## 2012-01-19 ENCOUNTER — Other Ambulatory Visit: Payer: Self-pay | Admitting: Cardiology

## 2012-01-25 ENCOUNTER — Ambulatory Visit (INDEPENDENT_AMBULATORY_CARE_PROVIDER_SITE_OTHER): Payer: Medicare Other | Admitting: *Deleted

## 2012-01-25 DIAGNOSIS — I4891 Unspecified atrial fibrillation: Secondary | ICD-10-CM

## 2012-01-25 DIAGNOSIS — Z7901 Long term (current) use of anticoagulants: Secondary | ICD-10-CM

## 2012-01-25 LAB — POCT INR: INR: 2.4

## 2012-02-27 ENCOUNTER — Other Ambulatory Visit: Payer: Self-pay | Admitting: Cardiology

## 2012-02-29 ENCOUNTER — Ambulatory Visit (INDEPENDENT_AMBULATORY_CARE_PROVIDER_SITE_OTHER): Payer: Medicare Other | Admitting: *Deleted

## 2012-02-29 DIAGNOSIS — Z7901 Long term (current) use of anticoagulants: Secondary | ICD-10-CM

## 2012-02-29 DIAGNOSIS — I4891 Unspecified atrial fibrillation: Secondary | ICD-10-CM

## 2012-02-29 LAB — POCT INR: INR: 3

## 2012-04-02 ENCOUNTER — Other Ambulatory Visit (HOSPITAL_COMMUNITY): Payer: Medicare Other

## 2012-04-04 ENCOUNTER — Encounter (HOSPITAL_COMMUNITY): Payer: Self-pay | Admitting: Oncology

## 2012-04-04 ENCOUNTER — Encounter (HOSPITAL_COMMUNITY): Payer: Medicare Other | Attending: Oncology | Admitting: Oncology

## 2012-04-04 VITALS — BP 125/67 | HR 81 | Temp 98.1°F | Resp 16 | Ht 62.25 in | Wt 165.8 lb

## 2012-04-04 DIAGNOSIS — Z09 Encounter for follow-up examination after completed treatment for conditions other than malignant neoplasm: Secondary | ICD-10-CM | POA: Insufficient documentation

## 2012-04-04 DIAGNOSIS — E119 Type 2 diabetes mellitus without complications: Secondary | ICD-10-CM | POA: Insufficient documentation

## 2012-04-04 DIAGNOSIS — I4891 Unspecified atrial fibrillation: Secondary | ICD-10-CM | POA: Insufficient documentation

## 2012-04-04 DIAGNOSIS — C180 Malignant neoplasm of cecum: Secondary | ICD-10-CM

## 2012-04-04 DIAGNOSIS — Z85038 Personal history of other malignant neoplasm of large intestine: Secondary | ICD-10-CM | POA: Insufficient documentation

## 2012-04-04 DIAGNOSIS — H409 Unspecified glaucoma: Secondary | ICD-10-CM | POA: Insufficient documentation

## 2012-04-04 NOTE — Patient Instructions (Addendum)
[facility]  Discharge Instructions  RECOMMENDATIONS MADE BY THE CONSULTANT AND ANY TEST RESULTS WILL BE SENT TO YOUR REFERRING DOCTOR.   EXAM FINDINGS BY MD TODAY AND SIGNS AND SYMPTOMS TO REPORT TO CLINIC OR PRIMARY MD: You are doing well.  Need to be seen by your gastroenterologist to get your next colonoscopy.  If they don't call you by December, call Dr. Luvenia Starch office.  MEDICATIONS PRESCRIBED: none   INSTRUCTIONS GIVEN AND DISCUSSED: Other :  Report changes in your bowel habits, blood in your bowel movements,etc.  SPECIAL INSTRUCTIONS/FOLLOW-UP: Lab work Needed today and every 3 months and Return to Clinic in 1 year.   I acknowledge that I have been informed and understand all the instructions given to me and received a copy. I do not have any more questions at this time, but understand that I may call the Specialty Clinic at [facility] at [phone] during business hours should I have any further questions or need assistance in obtaining follow-up care.    __________________________________________  _____________  __________ Signature of Patient or Authorized Representative            Date                   Time    __________________________________________ Nurse's Signature

## 2012-04-04 NOTE — Progress Notes (Signed)
Problem #1 stage II A. adenocarcinoma of colon status post right hemicolectomy by Dr. Leticia Penna not require chemotherapy. She is due for colonoscopy in December 2010. Her date of surgery was June 2009 #2 atrial fibrillation on Coumadin #3 diabetes mellitus #4 mild obesity #5 grade 1 peripheral neuropathy #6 history of stroke with right-sided residual weakness of the right hand and arm.  She is doing well without any change on her oncology review of systems. Vital signs are stable weight is still slightly excessive for her height. But her exam is negative for any lymphadenopathy. Breast exam is negative for masses. Lungs are clear. Heart shows is irregular rhythm but no murmur rub or gallop. Abdomen is soft nontender without organomegaly she has no peripheral edema. We will see her back in one year but she will continue CEAs every 3 months.

## 2012-04-05 LAB — CEA: CEA: 2.6 ng/mL (ref 0.0–5.0)

## 2012-04-11 ENCOUNTER — Ambulatory Visit (INDEPENDENT_AMBULATORY_CARE_PROVIDER_SITE_OTHER): Payer: Medicare Other | Admitting: *Deleted

## 2012-04-11 ENCOUNTER — Encounter (INDEPENDENT_AMBULATORY_CARE_PROVIDER_SITE_OTHER): Payer: Medicare Other | Admitting: *Deleted

## 2012-04-11 DIAGNOSIS — I4891 Unspecified atrial fibrillation: Secondary | ICD-10-CM

## 2012-04-11 DIAGNOSIS — Z7901 Long term (current) use of anticoagulants: Secondary | ICD-10-CM

## 2012-04-11 LAB — POCT INR
INR: 2.2
INR: 2.5

## 2012-05-01 ENCOUNTER — Encounter: Payer: Self-pay | Admitting: Internal Medicine

## 2012-05-22 ENCOUNTER — Encounter (INDEPENDENT_AMBULATORY_CARE_PROVIDER_SITE_OTHER): Payer: Medicare Other | Admitting: *Deleted

## 2012-05-22 DIAGNOSIS — I4891 Unspecified atrial fibrillation: Secondary | ICD-10-CM

## 2012-05-22 DIAGNOSIS — Z7901 Long term (current) use of anticoagulants: Secondary | ICD-10-CM

## 2012-05-22 LAB — POCT INR: INR: 2.3

## 2012-06-05 ENCOUNTER — Encounter (HOSPITAL_COMMUNITY): Payer: Self-pay | Admitting: *Deleted

## 2012-06-06 ENCOUNTER — Telehealth: Payer: Self-pay | Admitting: Urgent Care

## 2012-06-06 ENCOUNTER — Encounter: Payer: Self-pay | Admitting: Urgent Care

## 2012-06-06 ENCOUNTER — Ambulatory Visit (INDEPENDENT_AMBULATORY_CARE_PROVIDER_SITE_OTHER): Payer: Medicare Other | Admitting: Urgent Care

## 2012-06-06 VITALS — BP 121/81 | HR 70 | Temp 97.8°F | Ht 62.0 in | Wt 163.0 lb

## 2012-06-06 DIAGNOSIS — Z7901 Long term (current) use of anticoagulants: Secondary | ICD-10-CM

## 2012-06-06 DIAGNOSIS — C180 Malignant neoplasm of cecum: Secondary | ICD-10-CM

## 2012-06-06 MED ORDER — PEG 3350-KCL-NA BICARB-NACL 420 G PO SOLR: 4000.0000 mL | ORAL | Status: DC

## 2012-06-06 NOTE — Progress Notes (Signed)
Primary Care Physician:  FANTA,TESFAYE, MD Primary Gastroenterologist:  Dr. Rourk  Chief Complaint  Patient presents with  . Colonoscopy    Hx colon ca    HPI:  Katrina Arnold is a 79 y.o. female here to set up surveillance colonoscopy.  She has history of adenocarcinoma of the colon in 2009 status post right hemicolectomy. Last colonoscopy was by Dr. Rourk in November 2010 and she had multiple tubular adenomas and tubulovillous adenoma.  She has been having somewhat loose bowel movements 3-4 per day since had surgery for colon ca in 2009.   She also states she passes a lot of flatus.  Otherwise she feels great.  She is on chronic coumadin for atrial fibrillation.  She is also diabetic.   Denies constipation, rectal bleeding, melena or weight loss.   Denies heartburn, indigestion, nausea, vomiting, dysphagia, odynophagia or anorexia.  Past Medical History  Diagnosis Date  . Atrial fibrillation, chronic     normal EF  . Diabetes mellitus, type 2   . Hypertension   . Hyperlipidemia   . Cerebrovascular disease     Left cerebral CVA 2007; residual right sided weakness; 7/09 mild plaque; no focal stenosis  . Colon adenocarcinoma 2009    s/p right hemicolectomy  . Chronic kidney disease, stage 3, mod decreased GFR     Creatinine of 1.5 in 10/2008; h/o hyper- and hypokalemia  . Dupuytren's contracture   . Chronic anticoagulation     Past Surgical History  Procedure Date  . Hemicolectomy 12/2007    Right, adenocarcinoma  . Tubal ligation   . Appendectomy   . Colonoscopy w/ polypectomy 05/2009    Rourk: internal hemorrhoidal tag, site of piecemeal polypectomy in  August identified, residual polypectomy.  Multiple tubular adenomas/tubulovillous  . Colonoscopy 6/23/200    Rourk: Left diverticula, multiple polyps, adenocarcinoma of colon 8 cm distal to ICV    Current Outpatient Prescriptions  Medication Sig Dispense Refill  . acetaminophen (TYLENOL) 500 MG tablet Take 500 mg by mouth as  needed.        . amLODipine (NORVASC) 10 MG tablet Take 10 mg by mouth daily.        . ferrous sulfate 325 (65 FE) MG tablet Take 325 mg by mouth daily with breakfast.        . glipiZIDE (GLUCOTROL XL) 2.5 MG 24 hr tablet Take 2.5 mg by mouth daily.        . hydrochlorothiazide (HYDRODIURIL) 25 MG tablet Take 25 mg by mouth daily.       . metFORMIN (GLUCOPHAGE) 500 MG tablet Take 500 mg by mouth 2 (two) times daily with a meal.        . metoprolol (LOPRESSOR) 50 MG tablet Take 50 mg by mouth 2 (two) times daily.        . omeprazole (PRILOSEC) 20 MG capsule Take 20 mg by mouth daily.        . potassium chloride SA (K-DUR,KLOR-CON) 20 MEQ tablet TAKE 1 TABLET BY MOUTH EVERY DAY  30 tablet  0  . simvastatin (ZOCOR) 20 MG tablet Take 20 mg by mouth at bedtime.        . warfarin (COUMADIN) 5 MG tablet       . polyethylene glycol-electrolytes (TRILYTE) 420 G solution Take 4,000 mLs by mouth as directed.  4000 mL  0    Allergies as of 06/06/2012  . (No Known Allergies)    Family History:There is no known family history of colorectal   carcinoma , liver disease, or inflammatory bowel disease.  Problem Relation Age of Onset  . Diabetes Mother   . Heart attack Father   . Lung cancer Son     History   Social History  . Marital Status: Married    Spouse Name: N/A    Number of Children: 7  . Years of Education: N/A   Occupational History  . Retired    Social History Main Topics  . Smoking status: Current Every Day Smoker -- 75 years  . Smokeless tobacco: Current User    Types: Snuff  . Alcohol Use: No  . Drug Use: No  . Sexually Active: Not on file   Other Topics Concern  . Not on file   Social History Narrative   10 children total, 3 deceased (1-stillborn, 1-15 days, 1-lung ca)   Review of Systems: Gen: Denies any fever, chills, sweats, anorexia, fatigue, weakness, malaise, weight loss, and sleep disorder CV: Denies chest pain, angina, palpitations, syncope, orthopnea, PND,  peripheral edema, and claudication. Resp: Denies dyspnea at rest, dyspnea with exercise, cough, sputum, wheezing, coughing up blood, and pleurisy. GI: Denies vomiting blood, jaundice, and fecal incontinence. GU : Denies urinary burning, blood in urine, urinary frequency, urinary hesitancy, nocturnal urination, and urinary incontinence. MS: Denies joint pain, limitation of movement, and swelling, stiffness, low back pain, extremity pain. Denies muscle weakness, cramps, atrophy.  Derm: Denies rash, itching, dry skin, hives, moles, warts, or unhealing ulcers.  Psych: Denies depression, anxiety, memory loss, suicidal ideation, hallucinations, paranoia, and confusion. Heme: Denies bruising, bleeding, and enlarged lymph nodes. Neuro:  Denies any headaches, dizziness, paresthesias. Endo:  Denies any problems with DM, thyroid, adrenal function.  Physical Exam: BP 121/81  Pulse 70  Temp 97.8 F (36.6 C) (Temporal)  Ht 5' 2" (1.575 m)  Wt 163 lb (73.936 kg)  BMI 29.81 kg/m2 No LMP recorded. Patient is not currently having periods (Reason: Other). General:   Alert,  Well-developed, well-nourished, pleasant and cooperative in NAD Head:  Normocephalic and atraumatic. Eyes:  Sclera clear, no icterus.   Conjunctiva pink. Ears:  Normal auditory acuity. Nose:  No deformity, discharge, or lesions. Mouth:  No deformity or lesions,oropharynx pink & moist. Neck:  Supple; no masses or thyromegaly. Lungs:  Clear throughout to auscultation.   No wheezes, crackles, or rhonchi. No acute distress. Heart:  Regular rate and rhythm; no murmurs, clicks, rubs,  or gallops. Abdomen:  Normal bowel sounds.  No bruits.  Soft, non-tender and non-distended without masses, hepatosplenomegaly or hernias noted.  No guarding or rebound tenderness.   Rectal:  Deferred.  Msk:  Symmetrical without gross deformities. Normal posture. Pulses:  Normal pulses noted. Extremities:  No clubbing or edema. Neurologic:  Alert and   oriented x4;  grossly normal neurologically. Skin:  Intact without significant lesions or rashes. Lymph Nodes:  No significant cervical adenopathy. Psych:  Alert and cooperative. Normal mood and affect. 

## 2012-06-06 NOTE — Assessment & Plan Note (Addendum)
Katrina Arnold is a pleasant 78 y.o. female due for surveillance colonoscopy given history of colon cancer and multiple adenomatous polyps. She is asymptomatic at this time. She is on chronic anticoagulation in the way of Coumadin for history of atrial fibrillation. I will discuss management of Coumadin for her procedure with Dr. Jena Gauss.  I have discussed risks & benefits which include, but are not limited to, bleeding, infection, perforation & drug reaction.  The patient agrees with this plan & written consent will be obtained.    Take half glucotorol & glucophage the day prior to the procedure Hold diabetes medications day of procedure Bring all medications and/or any insulin to the hospital the day of the procedure. Follow blood sugars, call us or your PCP if any problems. No iron for 1 week before procedure

## 2012-06-06 NOTE — Patient Instructions (Addendum)
Take half glucotorol & glucophage the day prior to the procedure Hold diabetes medications day of procedure Bring all your medications and/or any insulin to the hospital the day of the procedure. Follow blood sugars, call us or your PCP if any problems. No iron for 1 week before your procedure We will call you with instructions about your coumadin for the procedure

## 2012-06-06 NOTE — Telephone Encounter (Signed)
Please ask pt to hold coumadin 4 days prior to colonoscopy per Dr Jena Gauss Thanks

## 2012-06-06 NOTE — Telephone Encounter (Signed)
Done

## 2012-06-06 NOTE — Progress Notes (Signed)
Faxed to PCP

## 2012-06-07 NOTE — Progress Notes (Signed)
Discussed coumadin mgmt w/ Dr Jena Gauss.  Recommended hold coumadin 4 days prior to colonoscopy.

## 2012-06-18 ENCOUNTER — Encounter (HOSPITAL_COMMUNITY): Payer: Self-pay | Admitting: Pharmacy Technician

## 2012-06-21 ENCOUNTER — Ambulatory Visit (INDEPENDENT_AMBULATORY_CARE_PROVIDER_SITE_OTHER): Payer: Medicare Other | Admitting: *Deleted

## 2012-06-21 DIAGNOSIS — Z7901 Long term (current) use of anticoagulants: Secondary | ICD-10-CM

## 2012-06-21 DIAGNOSIS — I4891 Unspecified atrial fibrillation: Secondary | ICD-10-CM

## 2012-06-21 LAB — POCT INR: INR: 2.5

## 2012-06-27 ENCOUNTER — Encounter (HOSPITAL_COMMUNITY): Payer: Medicare Other | Attending: Oncology

## 2012-06-27 DIAGNOSIS — C180 Malignant neoplasm of cecum: Secondary | ICD-10-CM | POA: Insufficient documentation

## 2012-06-27 NOTE — Progress Notes (Signed)
Labs drawn today for cea 

## 2012-06-28 LAB — CEA: CEA: 3.8 ng/mL (ref 0.0–5.0)

## 2012-07-02 ENCOUNTER — Encounter (HOSPITAL_COMMUNITY): Payer: Self-pay | Admitting: *Deleted

## 2012-07-02 ENCOUNTER — Encounter (HOSPITAL_COMMUNITY)
Admission: RE | Disposition: A | Discharge status: Home / Self Care | Payer: Self-pay | Source: Ambulatory Visit | Attending: Internal Medicine

## 2012-07-02 ENCOUNTER — Ambulatory Visit (HOSPITAL_COMMUNITY)
Admission: RE | Admit: 2012-07-02 | Discharge: 2012-07-02 | Disposition: A | Discharge status: Home / Self Care | Payer: Medicare Other | Source: Ambulatory Visit | Attending: Internal Medicine | Admitting: Internal Medicine

## 2012-07-02 DIAGNOSIS — C180 Malignant neoplasm of cecum: Secondary | ICD-10-CM

## 2012-07-02 DIAGNOSIS — I129 Hypertensive chronic kidney disease with stage 1 through stage 4 chronic kidney disease, or unspecified chronic kidney disease: Secondary | ICD-10-CM | POA: Insufficient documentation

## 2012-07-02 DIAGNOSIS — D126 Benign neoplasm of colon, unspecified: Secondary | ICD-10-CM

## 2012-07-02 DIAGNOSIS — N183 Chronic kidney disease, stage 3 unspecified: Secondary | ICD-10-CM | POA: Insufficient documentation

## 2012-07-02 DIAGNOSIS — K648 Other hemorrhoids: Secondary | ICD-10-CM

## 2012-07-02 DIAGNOSIS — Z7901 Long term (current) use of anticoagulants: Secondary | ICD-10-CM

## 2012-07-02 DIAGNOSIS — Z86010 Personal history of colonic polyps: Secondary | ICD-10-CM

## 2012-07-02 DIAGNOSIS — Z85038 Personal history of other malignant neoplasm of large intestine: Secondary | ICD-10-CM | POA: Insufficient documentation

## 2012-07-02 DIAGNOSIS — E119 Type 2 diabetes mellitus without complications: Secondary | ICD-10-CM | POA: Insufficient documentation

## 2012-07-02 HISTORY — PX: COLONOSCOPY: SHX5424

## 2012-07-02 LAB — GLUCOSE, CAPILLARY: Glucose-Capillary: 154 mg/dL — ABNORMAL HIGH (ref 70–99)

## 2012-07-02 SURGERY — COLONOSCOPY
Anesthesia: Moderate Sedation

## 2012-07-02 MED ORDER — SODIUM CHLORIDE 0.45 % IV SOLN
INTRAVENOUS | Status: DC
  Administered 2012-07-02: 1000 mL via INTRAVENOUS

## 2012-07-02 MED ORDER — MIDAZOLAM HCL 5 MG/5ML IJ SOLN
INTRAMUSCULAR | Status: DC | PRN
  Administered 2012-07-02: 2 mg via INTRAVENOUS
  Administered 2012-07-02: 1 mg via INTRAVENOUS

## 2012-07-02 MED ORDER — MEPERIDINE HCL 100 MG/ML IJ SOLN
INTRAMUSCULAR | Status: DC | PRN
  Administered 2012-07-02: 50 mg via INTRAVENOUS

## 2012-07-02 MED ORDER — MEPERIDINE HCL 100 MG/ML IJ SOLN
INTRAMUSCULAR | Status: AC
  Filled 2012-07-02: qty 2

## 2012-07-02 MED ORDER — MIDAZOLAM HCL 5 MG/5ML IJ SOLN
INTRAMUSCULAR | Status: AC
  Filled 2012-07-02: qty 10

## 2012-07-02 MED ORDER — SIMETHICONE 40 MG/0.6ML PO SUSP
ORAL | Status: DC | PRN
  Administered 2012-07-02: 09:00:00

## 2012-07-02 NOTE — Op Note (Signed)
[facility] [address] [place], [address]   COLONOSCOPY PROCEDURE REPORT  PATIENT: Katrina Arnold, Katrina Arnold  MR#:         [id number] BIRTHDATE: 07-30-1932 , 79  yrs. old GENDER: Female ENDOSCOPIST: R.  Roetta Sessions, MD FACP FACG REFERRED BY:  Glenice Laine, M.D. PROCEDURE DATE:  07/02/2012 PROCEDURE:     Colonoscopy with biopsy and snare polypectomy  INDICATIONS: Status post right hemicolectomy for colon cancer; history subsequent adenomas  INFORMED CONSENT:  The risks, benefits, alternatives and imponderables including but not limited to bleeding, perforation as well as the possibility of a missed lesion have been reviewed.  The potential for biopsy, lesion removal, etc. have also been discussed.  Questions have been answered.  All parties agreeable. Please see the history and physical in the medical record for more information.  MEDICATIONS: Versed 3 mg IV and Demerol 50 mg IV in divided doses.  DESCRIPTION OF PROCEDURE:  After a digital rectal exam was performed, the Pentax Colonoscope [id number]  colonoscope was advanced from the anus through the rectum and colon to the area of the anastomosis with small intestine.  These structures were well-seen and photographed for the record.  the scope was slowly and cautiously withdrawn.  The mucosal surfaces were carefully surveyed utilizing scope tip deflection to facilitate fold flattening as needed.  The scope was pulled down into the rectum where a thorough examination including retroflexion was performed.    FINDINGS: Inadequate preparation. Internal hemorrhoids; otherwise normal rectum.  Status post right hemicolectomy. Normal appearing anastomosis. Multiple 2-3 mm adenomatous appearing polyp just distal to the anastomosis and in the descending segment. Remainder of the residual colon mucosa appeared normal.  THERAPEUTIC / DIAGNOSTIC MANEUVERS PERFORMED:  multiple cold biopsies and snare polypectomies  performed.  COMPLICATIONS: None  CECAL WITHDRAWAL TIME:  Not applicable-no cecum  IMPRESSION:  Multiple colonic polyps removed as described above. inadequate preparation.  RECOMMENDATIONS:   Resume Coumadin today. Followup on pathology.   _______________________________ eSigned:  R. Roetta Sessions, MD FACP [facility] 07/02/2012 10:13 AM   CC:    PATIENT NAME:  Katrina Arnold, Katrina Arnold MR#: [id number]

## 2012-07-02 NOTE — H&P (View-Only) (Signed)
Primary Care Physician:  Avon Gully, MD Primary Gastroenterologist:  Dr. Jena Gauss  Chief Complaint  Patient presents with  . Colonoscopy    Hx colon ca    HPI:  Katrina Arnold is a 78 y.o. female here to set up surveillance colonoscopy.  She has history of adenocarcinoma of the colon in 2009 status post right hemicolectomy. Last colonoscopy was by Dr. Jena Gauss in November 2010 and she had multiple tubular adenomas and tubulovillous adenoma.  She has been having somewhat loose bowel movements 3-4 per day since had surgery for colon ca in 2009.   She also states she passes a lot of flatus.  Otherwise she feels great.  She is on chronic coumadin for atrial fibrillation.  She is also diabetic.   Denies constipation, rectal bleeding, melena or weight loss.   Denies heartburn, indigestion, nausea, vomiting, dysphagia, odynophagia or anorexia.  Past Medical History  Diagnosis Date  . Atrial fibrillation, chronic     normal EF  . Diabetes mellitus, type 2   . Hypertension   . Hyperlipidemia   . Cerebrovascular disease     Left cerebral CVA 2007; residual right sided weakness; 7/09 mild plaque; no focal stenosis  . Colon adenocarcinoma 2009    s/p right hemicolectomy  . Chronic kidney disease, stage 3, mod decreased GFR     Creatinine of 1.5 in 10/2008; h/o hyper- and hypokalemia  . Dupuytren's contracture   . Chronic anticoagulation     Past Surgical History  Procedure Date  . Hemicolectomy 12/2007    Right, adenocarcinoma  . Tubal ligation   . Appendectomy   . Colonoscopy w/ polypectomy 05/2009    Rourk: internal hemorrhoidal tag, site of piecemeal polypectomy in  August identified, residual polypectomy.  Multiple tubular adenomas/tubulovillous  . Colonoscopy 6/23/200    Rourk: Left diverticula, multiple polyps, adenocarcinoma of colon 8 cm distal to ICV    Current Outpatient Prescriptions  Medication Sig Dispense Refill  . acetaminophen (TYLENOL) 500 MG tablet Take 500 mg by mouth as  needed.        Marland Kitchen amLODipine (NORVASC) 10 MG tablet Take 10 mg by mouth daily.        . ferrous sulfate 325 (65 FE) MG tablet Take 325 mg by mouth daily with breakfast.        . glipiZIDE (GLUCOTROL XL) 2.5 MG 24 hr tablet Take 2.5 mg by mouth daily.        . hydrochlorothiazide (HYDRODIURIL) 25 MG tablet Take 25 mg by mouth daily.       . metFORMIN (GLUCOPHAGE) 500 MG tablet Take 500 mg by mouth 2 (two) times daily with a meal.        . metoprolol (LOPRESSOR) 50 MG tablet Take 50 mg by mouth 2 (two) times daily.        Marland Kitchen omeprazole (PRILOSEC) 20 MG capsule Take 20 mg by mouth daily.        . potassium chloride SA (K-DUR,KLOR-CON) 20 MEQ tablet TAKE 1 TABLET BY MOUTH EVERY DAY  30 tablet  0  . simvastatin (ZOCOR) 20 MG tablet Take 20 mg by mouth at bedtime.        Marland Kitchen warfarin (COUMADIN) 5 MG tablet       . polyethylene glycol-electrolytes (TRILYTE) 420 G solution Take 4,000 mLs by mouth as directed.  4000 mL  0    Allergies as of 06/06/2012  . (No Known Allergies)    Family History:There is no known family history of colorectal  carcinoma , liver disease, or inflammatory bowel disease.  Problem Relation Age of Onset  . Diabetes Mother   . Heart attack Father   . Lung cancer Son     History   Social History  . Marital Status: Married    Spouse Name: N/A    Number of Children: 7  . Years of Education: N/A   Occupational History  . Retired    Social History Main Topics  . Smoking status: Current Every Day Smoker -- 75 years  . Smokeless tobacco: Current User    Types: Snuff  . Alcohol Use: No  . Drug Use: No  . Sexually Active: Not on file   Other Topics Concern  . Not on file   Social History Narrative   10 children total, 3 deceased (1-stillborn, 1-15 days, 1-lung ca)   Review of Systems: Gen: Denies any fever, chills, sweats, anorexia, fatigue, weakness, malaise, weight loss, and sleep disorder CV: Denies chest pain, angina, palpitations, syncope, orthopnea, PND,  peripheral edema, and claudication. Resp: Denies dyspnea at rest, dyspnea with exercise, cough, sputum, wheezing, coughing up blood, and pleurisy. GI: Denies vomiting blood, jaundice, and fecal incontinence. GU : Denies urinary burning, blood in urine, urinary frequency, urinary hesitancy, nocturnal urination, and urinary incontinence. MS: Denies joint pain, limitation of movement, and swelling, stiffness, low back pain, extremity pain. Denies muscle weakness, cramps, atrophy.  Derm: Denies rash, itching, dry skin, hives, moles, warts, or unhealing ulcers.  Psych: Denies depression, anxiety, memory loss, suicidal ideation, hallucinations, paranoia, and confusion. Heme: Denies bruising, bleeding, and enlarged lymph nodes. Neuro:  Denies any headaches, dizziness, paresthesias. Endo:  Denies any problems with DM, thyroid, adrenal function.  Physical Exam: BP 121/81  Pulse 70  Temp 97.8 F (36.6 C) (Temporal)  Ht 5\' 2"  (1.575 m)  Wt 163 lb (73.936 kg)  BMI 29.81 kg/m2 No LMP recorded. Patient is not currently having periods (Reason: Other). General:   Alert,  Well-developed, well-nourished, pleasant and cooperative in NAD Head:  Normocephalic and atraumatic. Eyes:  Sclera clear, no icterus.   Conjunctiva pink. Ears:  Normal auditory acuity. Nose:  No deformity, discharge, or lesions. Mouth:  No deformity or lesions,oropharynx pink & moist. Neck:  Supple; no masses or thyromegaly. Lungs:  Clear throughout to auscultation.   No wheezes, crackles, or rhonchi. No acute distress. Heart:  Regular rate and rhythm; no murmurs, clicks, rubs,  or gallops. Abdomen:  Normal bowel sounds.  No bruits.  Soft, non-tender and non-distended without masses, hepatosplenomegaly or hernias noted.  No guarding or rebound tenderness.   Rectal:  Deferred.  Msk:  Symmetrical without gross deformities. Normal posture. Pulses:  Normal pulses noted. Extremities:  No clubbing or edema. Neurologic:  Alert and   oriented x4;  grossly normal neurologically. Skin:  Intact without significant lesions or rashes. Lymph Nodes:  No significant cervical adenopathy. Psych:  Alert and cooperative. Normal mood and affect.

## 2012-07-02 NOTE — Interval H&P Note (Signed)
History and Physical Interval Note:  07/02/2012 9:31 AM  Katrina Arnold  has presented today for surgery, with the diagnosis of HISTORY OF COLON CANCER  The various methods of treatment have been discussed with the patient and family. After consideration of risks, benefits and other options for treatment, the patient has consented to  Procedure(s) (LRB) with comments: COLONOSCOPY (N/A) - 9:30 as a surgical intervention .  The patient's history has been reviewed, patient examined, no change in status, stable for surgery.  I have reviewed the patient's chart and labs.  Questions were answered to the patient's satisfaction.     Eula Listen  As above. Colonoscopy per plan. Coumadin held x4 days.The risks, benefits, limitations, alternatives and imponderables have been reviewed with the patient. Questions have been answered. All parties are agreeable.

## 2012-07-04 ENCOUNTER — Encounter: Payer: Self-pay | Admitting: Internal Medicine

## 2012-07-05 ENCOUNTER — Encounter: Payer: Self-pay | Admitting: *Deleted

## 2012-07-05 ENCOUNTER — Encounter (HOSPITAL_COMMUNITY): Payer: Self-pay | Admitting: Internal Medicine

## 2012-07-25 ENCOUNTER — Ambulatory Visit (INDEPENDENT_AMBULATORY_CARE_PROVIDER_SITE_OTHER): Payer: Medicare Other | Admitting: *Deleted

## 2012-07-25 DIAGNOSIS — Z7901 Long term (current) use of anticoagulants: Secondary | ICD-10-CM

## 2012-07-25 DIAGNOSIS — I4891 Unspecified atrial fibrillation: Secondary | ICD-10-CM

## 2012-08-22 ENCOUNTER — Ambulatory Visit (INDEPENDENT_AMBULATORY_CARE_PROVIDER_SITE_OTHER): Payer: Medicare Other | Admitting: *Deleted

## 2012-08-22 DIAGNOSIS — Z7901 Long term (current) use of anticoagulants: Secondary | ICD-10-CM

## 2012-08-22 DIAGNOSIS — I4891 Unspecified atrial fibrillation: Secondary | ICD-10-CM

## 2012-08-22 LAB — POCT INR: INR: 2.4

## 2012-09-19 ENCOUNTER — Encounter (HOSPITAL_COMMUNITY): Payer: Medicare Other | Attending: Oncology

## 2012-09-19 ENCOUNTER — Ambulatory Visit (INDEPENDENT_AMBULATORY_CARE_PROVIDER_SITE_OTHER): Payer: Medicare Other | Admitting: *Deleted

## 2012-09-19 DIAGNOSIS — C180 Malignant neoplasm of cecum: Secondary | ICD-10-CM | POA: Insufficient documentation

## 2012-09-19 DIAGNOSIS — Z7901 Long term (current) use of anticoagulants: Secondary | ICD-10-CM

## 2012-09-19 DIAGNOSIS — I4891 Unspecified atrial fibrillation: Secondary | ICD-10-CM

## 2012-09-19 LAB — CEA: CEA: 3.4 ng/mL (ref 0.0–5.0)

## 2012-09-19 NOTE — Progress Notes (Signed)
Labs drawn today for cea 

## 2012-10-17 ENCOUNTER — Encounter (INDEPENDENT_AMBULATORY_CARE_PROVIDER_SITE_OTHER): Payer: Medicare Other

## 2012-10-17 DIAGNOSIS — Z7901 Long term (current) use of anticoagulants: Secondary | ICD-10-CM

## 2012-10-17 DIAGNOSIS — I4891 Unspecified atrial fibrillation: Secondary | ICD-10-CM

## 2012-10-31 ENCOUNTER — Ambulatory Visit (INDEPENDENT_AMBULATORY_CARE_PROVIDER_SITE_OTHER): Payer: Medicare Other | Admitting: *Deleted

## 2012-10-31 DIAGNOSIS — I4891 Unspecified atrial fibrillation: Secondary | ICD-10-CM

## 2012-10-31 DIAGNOSIS — Z7901 Long term (current) use of anticoagulants: Secondary | ICD-10-CM

## 2012-11-02 ENCOUNTER — Other Ambulatory Visit: Payer: Self-pay | Admitting: Cardiology

## 2012-12-12 ENCOUNTER — Encounter (HOSPITAL_COMMUNITY): Payer: Medicare Other | Attending: Oncology

## 2012-12-12 ENCOUNTER — Ambulatory Visit (INDEPENDENT_AMBULATORY_CARE_PROVIDER_SITE_OTHER): Payer: Medicare Other | Admitting: *Deleted

## 2012-12-12 DIAGNOSIS — C180 Malignant neoplasm of cecum: Secondary | ICD-10-CM | POA: Insufficient documentation

## 2012-12-12 DIAGNOSIS — Z7901 Long term (current) use of anticoagulants: Secondary | ICD-10-CM

## 2012-12-12 DIAGNOSIS — I4891 Unspecified atrial fibrillation: Secondary | ICD-10-CM

## 2012-12-12 LAB — CEA: CEA: 3.8 ng/mL (ref 0.0–5.0)

## 2012-12-12 NOTE — Progress Notes (Signed)
Labs drawn today for cea 

## 2013-01-02 ENCOUNTER — Ambulatory Visit (INDEPENDENT_AMBULATORY_CARE_PROVIDER_SITE_OTHER): Payer: Medicare Other | Admitting: *Deleted

## 2013-01-02 DIAGNOSIS — Z7901 Long term (current) use of anticoagulants: Secondary | ICD-10-CM

## 2013-01-02 DIAGNOSIS — I4891 Unspecified atrial fibrillation: Secondary | ICD-10-CM

## 2013-01-30 ENCOUNTER — Ambulatory Visit (INDEPENDENT_AMBULATORY_CARE_PROVIDER_SITE_OTHER): Payer: Medicare Other | Admitting: *Deleted

## 2013-01-30 DIAGNOSIS — Z7901 Long term (current) use of anticoagulants: Secondary | ICD-10-CM

## 2013-01-30 DIAGNOSIS — I4891 Unspecified atrial fibrillation: Secondary | ICD-10-CM

## 2013-03-02 ENCOUNTER — Other Ambulatory Visit: Payer: Self-pay | Admitting: Cardiology

## 2013-03-06 ENCOUNTER — Encounter (HOSPITAL_COMMUNITY): Payer: Medicare Other | Attending: Hematology and Oncology

## 2013-03-06 DIAGNOSIS — Z09 Encounter for follow-up examination after completed treatment for conditions other than malignant neoplasm: Secondary | ICD-10-CM | POA: Insufficient documentation

## 2013-03-06 DIAGNOSIS — C180 Malignant neoplasm of cecum: Secondary | ICD-10-CM

## 2013-03-06 NOTE — Progress Notes (Signed)
Labs drawn today for cea 

## 2013-03-07 LAB — CEA: CEA: 2.8 ng/mL (ref 0.0–5.0)

## 2013-03-21 ENCOUNTER — Ambulatory Visit (INDEPENDENT_AMBULATORY_CARE_PROVIDER_SITE_OTHER): Payer: Medicare Other | Admitting: *Deleted

## 2013-03-21 DIAGNOSIS — Z7901 Long term (current) use of anticoagulants: Secondary | ICD-10-CM

## 2013-03-21 DIAGNOSIS — I4891 Unspecified atrial fibrillation: Secondary | ICD-10-CM

## 2013-03-21 LAB — POCT INR: INR: 2.3

## 2013-03-31 ENCOUNTER — Other Ambulatory Visit: Payer: Self-pay | Admitting: Cardiology

## 2013-04-01 ENCOUNTER — Encounter: Payer: Self-pay | Admitting: *Deleted

## 2013-04-04 ENCOUNTER — Encounter (HOSPITAL_COMMUNITY): Payer: Medicare Other | Attending: Hematology and Oncology | Admitting: Oncology

## 2013-04-04 ENCOUNTER — Encounter (HOSPITAL_COMMUNITY): Payer: Self-pay | Admitting: Oncology

## 2013-04-04 VITALS — BP 121/74 | HR 76 | Temp 98.1°F | Resp 16 | Wt 165.1 lb

## 2013-04-04 DIAGNOSIS — Z09 Encounter for follow-up examination after completed treatment for conditions other than malignant neoplasm: Secondary | ICD-10-CM | POA: Insufficient documentation

## 2013-04-04 DIAGNOSIS — C189 Malignant neoplasm of colon, unspecified: Secondary | ICD-10-CM

## 2013-04-04 NOTE — Progress Notes (Signed)
[facility], [place] [address] [place] [address]  Hx of Adenocarcinoma of colon  CURRENT THERAPY: Observation  INTERVAL HISTORY: Katrina Arnold 79 y.o. female returns for  regular  visit for followup of stage II A. adenocarcinoma of colon status post right hemicolectomy in June 2009 by Dr. Leticia Penna not require chemotherapy. She is S/P colonoscopy in November 2013.    She is doing very well.  She denies any complaints.    I personally reviewed and went over radiographic studies with the patient.  Colonoscopy was negative last year by Dr. Jena Gauss and she is to follow-up with him as directed.   She reports that she sees Dr. Felecia Shelling every 3 months and she does not know of any concerns that he has regarding her health.  I have encouraged she continue to follow-up with PCP as directed.   I personally reviewed and went over laboratory results with the patient.  Her CEAs have been stable over the past 5 years.  She completed 5 years of CEA surveillance as of June 2014. As a result, we will D/C surveillance CEAs.  She denies any complaints and oncologic ROS questioning is negative.   Past Medical History  Diagnosis Date  . Atrial fibrillation, chronic     normal EF  . Diabetes mellitus, type 2   . Hypertension   . Hyperlipidemia   . Cerebrovascular disease     Left cerebral CVA 2007; residual right sided weakness; 7/09 mild plaque; no focal stenosis  . Chronic kidney disease, stage 3, mod decreased GFR     Creatinine of 1.5 in 10/2008; h/o hyper- and hypokalemia  . Dupuytren's contracture   . Chronic anticoagulation   . Colon adenocarcinoma 2009    s/p right hemicolectomy    has Hx of Adenocarcinoma of colon; DIABETES MELLITUS, TYPE II; HYPERLIPIDEMIA; ANEMIA; Hypertension; Atrial fibrillation; CEREBROVASCULAR DISEASE; CHRONIC KIDNEY DISEASE STAGE II (MILD); and Chronic anticoagulation on her problem list.     has No Known Allergies.  Ms. Gallina had no medications  administered during this visit.  Past Surgical History  Procedure Laterality Date  . Hemicolectomy  12/2007    Right, adenocarcinoma  . Tubal ligation    . Appendectomy    . Colonoscopy w/ polypectomy  05/2009    Rourk: internal hemorrhoidal tag, site of piecemeal polypectomy in  August identified, residual polypectomy.  Multiple tubular adenomas/tubulovillous  . Colonoscopy  6/23/200    Rourk: Left diverticula, multiple polyps, adenocarcinoma of colon 8 cm distal to ICV  . Colonoscopy  07/02/2012    Procedure: COLONOSCOPY;  Surgeon: Corbin Ade, MD;  Location: [facility];  Service: Endoscopy;  Laterality: N/A;  9:30    Denies any headaches, dizziness, double vision, fevers, chills, night sweats, nausea, vomiting, diarrhea, constipation, chest pain, heart palpitations, shortness of breath, blood in stool, black tarry stool, urinary pain, urinary burning, urinary frequency, hematuria.   PHYSICAL EXAMINATION  ECOG PERFORMANCE STATUS: 1 - Symptomatic but completely ambulatory  Filed Vitals:   04/04/13 0920  BP: 121/74  Pulse: 76  Temp: 98.1 F (36.7 C)  Resp: 16    GENERAL:alert, no distress, well nourished, well developed, comfortable, cooperative and smiling SKIN: skin color, texture, turgor are normal, no rashes or significant lesions HEAD: Normocephalic, No masses, lesions, tenderness or abnormalities EYES: normal, PERRLA, EOMI, Conjunctiva are pink and non-injected EARS: External ears normal OROPHARYNX:mucous membranes are moist  NECK: supple, no adenopathy, thyroid normal size, non-tender, without nodularity, no stridor,  non-tender, trachea midline LYMPH:  no palpable lymphadenopathy, no hepatosplenomegaly BREAST:not examined LUNGS: clear to auscultation and percussion HEART: regular rate & rhythm, no murmurs, no gallops, S1 normal and S2 normal ABDOMEN:abdomen soft, non-tender, obese and normal bowel sounds BACK: Back symmetric, no curvature. EXTREMITIES:less  then 2 second capillary refill, no joint deformities, effusion, or inflammation, no edema, no skin discoloration, no clubbing, no cyanosis  NEURO: alert & oriented x 3 with fluent speech, no focal motor/sensory deficits, gait normal    LABORATORY DATA: Lab Results  Component Value Date   CEA 2.8 03/06/2013      ASSESSMENT:  1. Stage II A. adenocarcinoma of colon status post right hemicolectomy in June 2009 by Dr. Leticia Penna not require chemotherapy. She is S/P colonoscopy in November 2013.   Patient Active Problem List   Diagnosis Date Noted  . Chronic anticoagulation 10/13/2010  . CEREBROVASCULAR DISEASE 11/17/2009  . Hx of Adenocarcinoma of colon 01/05/2009  . DIABETES MELLITUS, TYPE II 01/05/2009  . HYPERLIPIDEMIA 01/05/2009  . ANEMIA 01/05/2009  . Hypertension 01/05/2009  . Atrial fibrillation 01/05/2009  . CHRONIC KIDNEY DISEASE STAGE II (MILD) 01/05/2009     PLAN:  1. Patient has completed 5 years of surveillance. 2. Will follow NCCN guidelines for surveillance.  3. Follow-up with GI as directed 4. Follow-up with PCP as directed 5. Return in 1 year for follow-up   THERAPY PLAN:  She has completed 5 years of CEA surveillance per NCCN guidelines.  Therefore, we will see her annually.  All questions were answered. The patient knows to call the clinic with any problems, questions or concerns. We can certainly see the patient much sooner if necessary.  Patient and plan discussed with Dr. Alla German and he is in agreement with the aforementioned.   KEFALAS,THOMAS

## 2013-04-04 NOTE — Patient Instructions (Addendum)
[facility] Discharge Instructions  RECOMMENDATIONS MADE BY THE CONSULTANT AND ANY TEST RESULTS WILL BE SENT TO YOUR REFERRING PHYSICIAN.  EXAM FINDINGS BY THE PHYSICIAN TODAY AND SIGNS OR SYMPTOMS TO REPORT TO CLINIC OR PRIMARY PHYSICIAN: Exam and findings as discussed by Dellis Anes, PA-C. You are doing well.  Report changes in bowel habits, blood in your bowel movement or other problems.  MEDICATIONS PRESCRIBED:  none  INSTRUCTIONS/FOLLOW-UP: Follow-up in 1 year.  Thank you for choosing [facility] to provide your oncology and hematology care.  To afford each patient quality time with our providers, please arrive at least 15 minutes before your scheduled appointment time.  With your help, our goal is to use those 15 minutes to complete the necessary work-up to ensure our physicians have the information they need to help with your evaluation and healthcare recommendations.    Effective January 1st, 2014, we ask that you re-schedule your appointment with our physicians should you arrive 10 or more minutes late for your appointment.  We strive to give you quality time with our providers, and arriving late affects you and other patients whose appointments are after yours.    Again, thank you for choosing [facility].  Our hope is that these requests will decrease the amount of time that you wait before being seen by our physicians.       _____________________________________________________________  Should you have questions after your visit to [facility], please contact our office at [phone] between the hours of 8:30 a.m. and 5:00 p.m.  Voicemails left after 4:30 p.m. will not be returned until the following business day.  For prescription refill requests, have your pharmacy contact our office with your prescription refill request.

## 2013-05-02 ENCOUNTER — Ambulatory Visit (INDEPENDENT_AMBULATORY_CARE_PROVIDER_SITE_OTHER): Payer: Medicare Other | Admitting: *Deleted

## 2013-05-02 DIAGNOSIS — I4891 Unspecified atrial fibrillation: Secondary | ICD-10-CM

## 2013-05-02 DIAGNOSIS — Z7901 Long term (current) use of anticoagulants: Secondary | ICD-10-CM

## 2013-06-12 ENCOUNTER — Encounter: Payer: Self-pay | Admitting: *Deleted

## 2013-06-26 ENCOUNTER — Ambulatory Visit (INDEPENDENT_AMBULATORY_CARE_PROVIDER_SITE_OTHER): Payer: Medicare Other | Admitting: *Deleted

## 2013-06-26 DIAGNOSIS — Z7901 Long term (current) use of anticoagulants: Secondary | ICD-10-CM

## 2013-06-26 DIAGNOSIS — I4891 Unspecified atrial fibrillation: Secondary | ICD-10-CM

## 2013-06-27 ENCOUNTER — Encounter: Payer: Self-pay | Admitting: Internal Medicine

## 2013-06-27 ENCOUNTER — Telehealth: Payer: Self-pay | Admitting: Internal Medicine

## 2013-08-02 ENCOUNTER — Other Ambulatory Visit: Payer: Self-pay | Admitting: Adult Health

## 2013-08-07 ENCOUNTER — Ambulatory Visit (INDEPENDENT_AMBULATORY_CARE_PROVIDER_SITE_OTHER): Payer: Medicare Other | Admitting: *Deleted

## 2013-08-07 DIAGNOSIS — I4891 Unspecified atrial fibrillation: Secondary | ICD-10-CM

## 2013-08-07 DIAGNOSIS — Z7901 Long term (current) use of anticoagulants: Secondary | ICD-10-CM

## 2013-08-07 LAB — POCT INR: INR: 2.4

## 2013-08-27 ENCOUNTER — Ambulatory Visit: Payer: Medicare Other | Admitting: Cardiology

## 2013-09-03 ENCOUNTER — Other Ambulatory Visit: Payer: Self-pay | Admitting: Adult Health

## 2013-09-23 ENCOUNTER — Ambulatory Visit: Payer: Medicare Other | Admitting: Cardiology

## 2013-09-25 ENCOUNTER — Encounter: Payer: Self-pay | Admitting: Cardiology

## 2013-09-25 ENCOUNTER — Ambulatory Visit (INDEPENDENT_AMBULATORY_CARE_PROVIDER_SITE_OTHER): Payer: Medicare Other | Admitting: Cardiology

## 2013-09-25 ENCOUNTER — Ambulatory Visit (INDEPENDENT_AMBULATORY_CARE_PROVIDER_SITE_OTHER): Payer: Medicare Other | Admitting: *Deleted

## 2013-09-25 VITALS — BP 114/73 | HR 100 | Ht 62.5 in | Wt 168.0 lb

## 2013-09-25 DIAGNOSIS — I4891 Unspecified atrial fibrillation: Secondary | ICD-10-CM

## 2013-09-25 DIAGNOSIS — Z7901 Long term (current) use of anticoagulants: Secondary | ICD-10-CM

## 2013-09-25 DIAGNOSIS — I10 Essential (primary) hypertension: Secondary | ICD-10-CM

## 2013-09-25 DIAGNOSIS — Z5181 Encounter for therapeutic drug level monitoring: Secondary | ICD-10-CM

## 2013-09-25 DIAGNOSIS — E785 Hyperlipidemia, unspecified: Secondary | ICD-10-CM

## 2013-09-25 LAB — POCT INR: INR: 1.9

## 2013-09-25 MED ORDER — AMLODIPINE BESYLATE 5 MG PO TABS: 5.0000 mg | ORAL_TABLET | Freq: Every day | ORAL | Status: DC

## 2013-09-25 NOTE — Progress Notes (Signed)
Clinical Summary Ms. Gulledge is a 80 y.o.female former patient of Dr Lattie Haw, this is our first visit together. He is seen for the following medical problems.  1. Afib - denies any palpitations - on coumadin, denies any bleeding issues with coumadin  2. HTN - compliant with meds - typically 100s/40s at home. Occasional lightheadness at times with standing  3. Hyperlipidemia - compliant with zocor - no recent panel in our system  4. Hx of colon CA - prior right hemocolectomy - followed by GI  Past Medical History  Diagnosis Date  . Atrial fibrillation, chronic     normal EF  . Diabetes mellitus, type 2   . Hypertension   . Hyperlipidemia   . Cerebrovascular disease     Left cerebral CVA 2007; residual right sided weakness; 7/09 mild plaque; no focal stenosis  . Chronic kidney disease, stage 3, mod decreased GFR     Creatinine of 1.5 in 10/2008; h/o hyper- and hypokalemia  . Dupuytren's contracture   . Chronic anticoagulation   . Colon adenocarcinoma 2009    s/p right hemicolectomy     No Known Allergies   Current Outpatient Prescriptions  Medication Sig Dispense Refill  . acetaminophen (TYLENOL) 500 MG tablet Take 500 mg by mouth as needed.        Marland Kitchen amLODipine (NORVASC) 10 MG tablet Take 10 mg by mouth daily.        Marland Kitchen glipiZIDE (GLUCOTROL XL) 2.5 MG 24 hr tablet Take 2.5 mg by mouth daily.        . hydrochlorothiazide (MICROZIDE) 12.5 MG capsule Take 12.5 mg by mouth daily.      . metFORMIN (GLUCOPHAGE) 500 MG tablet Take 500 mg by mouth 2 (two) times daily with a meal.        . metoprolol (LOPRESSOR) 50 MG tablet Take 50 mg by mouth 2 (two) times daily.        . NON FORMULARY Take 65 mg by mouth daily. Ferrous Sulfate iron supplement tablet      . omeprazole (PRILOSEC) 20 MG capsule Take 20 mg by mouth daily.        . potassium chloride SA (K-DUR,KLOR-CON) 20 MEQ tablet TAKE 1 TABLET BY MOUTH EVERY DAY  30 tablet  0  . simvastatin (ZOCOR) 20 MG tablet Take 20  mg by mouth at bedtime.       Marland Kitchen warfarin (COUMADIN) 5 MG tablet TAKE 1 TABLET BY MOUTH AS DIRECTED  30 tablet  0   No current facility-administered medications for this visit.     Past Surgical History  Procedure Laterality Date  . Hemicolectomy  12/2007    Right, adenocarcinoma  . Tubal ligation    . Appendectomy    . Colonoscopy w/ polypectomy  05/2009    Rourk: internal hemorrhoidal tag, site of piecemeal polypectomy in  August identified, residual polypectomy.  Multiple tubular adenomas/tubulovillous  . Colonoscopy  6/23/200    Rourk: Left diverticula, multiple polyps, adenocarcinoma of colon 8 cm distal to ICV  . Colonoscopy  07/02/2012    Procedure: COLONOSCOPY;  Surgeon: Daneil Dolin, MD;  Location: [facility];  Service: Endoscopy;  Laterality: N/A;  9:30     No Known Allergies    Family History  Problem Relation Age of Onset  . Diabetes Mother   . Heart attack Father   . Lung cancer Son      Social History Ms. Niebla reports that she has been smoking.  Her smokeless tobacco use includes Snuff. Ms. Spackman reports that she does not drink alcohol.   Review of Systems CONSTITUTIONAL: No weight loss, fever, chills, weakness or fatigue.  HEENT: Eyes: No visual loss, blurred vision, double vision or yellow sclerae.No hearing loss, sneezing, congestion, runny nose or sore throat.  SKIN: No rash or itching.  CARDIOVASCULAR: per HPI RESPIRATORY: No shortness of breath, cough or sputum.  GASTROINTESTINAL: No anorexia, nausea, vomiting or diarrhea. No abdominal pain or blood.  GENITOURINARY: No burning on urination, no polyuria NEUROLOGICAL: occasional dizziness MUSCULOSKELETAL: No muscle, back pain, joint pain or stiffness.  LYMPHATICS: No enlarged nodes. No history of splenectomy.  PSYCHIATRIC: No history of depression or anxiety.  ENDOCRINOLOGIC: No reports of sweating, cold or heat intolerance. No polyuria or polydipsia.  Marland Kitchen   Physical Examination p 75 bp  114/73 Wt 168 lbs BMI 30 Gen: resting comfortably, no acute distress HEENT: no scleral icterus, pupils equal round and reactive, no palptable cervical adenopathy,  CV: irreg, no m/r/g, no JVD, no carotid bruits Resp: Clear to auscultation bilaterally GI: abdomen is soft, non-tender, non-distended, normal bowel sounds, no hepatosplenomegaly MSK: extremities are warm, no edema.  Skin: warm, no rash Neuro:  no focal deficits Psych: appropriate affect   Diagnostic Studies  09/25/13 Clinic EKG: afib rate 75, LAD   Assessment and Plan  1. Afib - no current symptoms, continue rate control and coumadin  2. HTN - low bp's at home that are at times symptomatic - decrease norvasc to 5mg  daily  3. Hyperlipidemia - continue current statin, request recent labs from pcp  Follow up 1 year      Arnoldo Lenis, M.D., F.A.C.C.

## 2013-09-25 NOTE — Patient Instructions (Signed)
Your physician recommends that you schedule a follow-up appointment in: 1 year with Dr Harl Bowie The patient's paper medical record is not available during this visit. It has been removed from this office and cannot be located.  Your physician has recommended you make the following change in your medication:  Decreased Amlodipine to 5 mg daily

## 2013-09-26 ENCOUNTER — Encounter: Payer: Self-pay | Admitting: Cardiology

## 2013-10-06 ENCOUNTER — Other Ambulatory Visit: Payer: Self-pay | Admitting: Adult Health

## 2013-10-30 ENCOUNTER — Ambulatory Visit (INDEPENDENT_AMBULATORY_CARE_PROVIDER_SITE_OTHER): Payer: Medicare Other | Admitting: *Deleted

## 2013-10-30 DIAGNOSIS — Z5181 Encounter for therapeutic drug level monitoring: Secondary | ICD-10-CM

## 2013-10-30 DIAGNOSIS — Z7901 Long term (current) use of anticoagulants: Secondary | ICD-10-CM

## 2013-10-30 DIAGNOSIS — I4891 Unspecified atrial fibrillation: Secondary | ICD-10-CM

## 2013-10-30 LAB — POCT INR: INR: 2.1

## 2013-11-28 ENCOUNTER — Ambulatory Visit (INDEPENDENT_AMBULATORY_CARE_PROVIDER_SITE_OTHER): Payer: Medicare Other | Admitting: *Deleted

## 2013-11-28 DIAGNOSIS — Z5181 Encounter for therapeutic drug level monitoring: Secondary | ICD-10-CM

## 2013-11-28 DIAGNOSIS — Z7901 Long term (current) use of anticoagulants: Secondary | ICD-10-CM

## 2013-11-28 DIAGNOSIS — I4891 Unspecified atrial fibrillation: Secondary | ICD-10-CM

## 2013-11-28 LAB — POCT INR: INR: 2.5

## 2013-12-26 ENCOUNTER — Ambulatory Visit (INDEPENDENT_AMBULATORY_CARE_PROVIDER_SITE_OTHER): Payer: Medicare Other | Admitting: *Deleted

## 2013-12-26 DIAGNOSIS — Z5181 Encounter for therapeutic drug level monitoring: Secondary | ICD-10-CM

## 2013-12-26 DIAGNOSIS — I4891 Unspecified atrial fibrillation: Secondary | ICD-10-CM

## 2013-12-26 DIAGNOSIS — Z7901 Long term (current) use of anticoagulants: Secondary | ICD-10-CM

## 2013-12-26 LAB — POCT INR: INR: 2.5

## 2014-01-31 ENCOUNTER — Telehealth: Payer: Self-pay | Admitting: Cardiology

## 2014-01-31 MED ORDER — WARFARIN SODIUM 5 MG PO TABS: ORAL_TABLET | ORAL | Status: DC

## 2014-01-31 NOTE — Telephone Encounter (Signed)
Received fax refill request  Rx # [id number] Medication:  Warfarin Sodium 5 mg tablets Qty 30 Sig:  Take one tablet by mouth every day except take 1/2 tablet on Mondays and Thursdays as directed Physician:  Harl Bowie

## 2014-02-06 ENCOUNTER — Ambulatory Visit (INDEPENDENT_AMBULATORY_CARE_PROVIDER_SITE_OTHER): Payer: Medicare Other | Admitting: *Deleted

## 2014-02-06 DIAGNOSIS — Z5181 Encounter for therapeutic drug level monitoring: Secondary | ICD-10-CM

## 2014-02-06 DIAGNOSIS — Z7901 Long term (current) use of anticoagulants: Secondary | ICD-10-CM

## 2014-02-06 DIAGNOSIS — I4891 Unspecified atrial fibrillation: Secondary | ICD-10-CM

## 2014-02-06 LAB — POCT INR: INR: 2.9

## 2014-03-27 ENCOUNTER — Ambulatory Visit (INDEPENDENT_AMBULATORY_CARE_PROVIDER_SITE_OTHER): Payer: Medicare Other | Admitting: *Deleted

## 2014-03-27 DIAGNOSIS — I4891 Unspecified atrial fibrillation: Secondary | ICD-10-CM

## 2014-03-27 DIAGNOSIS — Z7901 Long term (current) use of anticoagulants: Secondary | ICD-10-CM

## 2014-03-27 DIAGNOSIS — Z5181 Encounter for therapeutic drug level monitoring: Secondary | ICD-10-CM

## 2014-03-27 LAB — POCT INR: INR: 2.7

## 2014-04-04 ENCOUNTER — Encounter (HOSPITAL_COMMUNITY): Payer: Self-pay

## 2014-04-04 ENCOUNTER — Encounter (HOSPITAL_COMMUNITY): Payer: Medicare Other | Attending: Hematology and Oncology

## 2014-04-04 VITALS — BP 134/70 | HR 70 | Temp 98.3°F | Resp 18 | Wt 170.4 lb

## 2014-04-04 DIAGNOSIS — C189 Malignant neoplasm of colon, unspecified: Secondary | ICD-10-CM | POA: Diagnosis present

## 2014-04-04 DIAGNOSIS — Z85038 Personal history of other malignant neoplasm of large intestine: Secondary | ICD-10-CM

## 2014-04-04 DIAGNOSIS — I129 Hypertensive chronic kidney disease with stage 1 through stage 4 chronic kidney disease, or unspecified chronic kidney disease: Secondary | ICD-10-CM | POA: Diagnosis not present

## 2014-04-04 DIAGNOSIS — N183 Chronic kidney disease, stage 3 unspecified: Secondary | ICD-10-CM | POA: Insufficient documentation

## 2014-04-04 DIAGNOSIS — E785 Hyperlipidemia, unspecified: Secondary | ICD-10-CM | POA: Diagnosis not present

## 2014-04-04 DIAGNOSIS — E1149 Type 2 diabetes mellitus with other diabetic neurological complication: Secondary | ICD-10-CM | POA: Diagnosis not present

## 2014-04-04 DIAGNOSIS — I4891 Unspecified atrial fibrillation: Secondary | ICD-10-CM

## 2014-04-04 DIAGNOSIS — E1142 Type 2 diabetes mellitus with diabetic polyneuropathy: Secondary | ICD-10-CM | POA: Diagnosis not present

## 2014-04-04 LAB — COMPREHENSIVE METABOLIC PANEL
ALBUMIN: 4.1 g/dL (ref 3.5–5.2)
ALT: 9 U/L (ref 0–35)
AST: 24 U/L (ref 0–37)
Alkaline Phosphatase: 169 U/L — ABNORMAL HIGH (ref 39–117)
Anion gap: 15 (ref 5–15)
BUN: 13 mg/dL (ref 6–23)
CO2: 26 meq/L (ref 19–32)
CREATININE: 0.95 mg/dL (ref 0.50–1.10)
Calcium: 9.4 mg/dL (ref 8.4–10.5)
Chloride: 99 mEq/L (ref 96–112)
GFR calc Af Amer: 64 mL/min — ABNORMAL LOW (ref >90)
GFR, EST NON AFRICAN AMERICAN: 55 mL/min — AB (ref >90)
Glucose, Bld: 187 mg/dL — ABNORMAL HIGH (ref 70–99)
Potassium: 3.9 mEq/L (ref 3.7–5.3)
SODIUM: 140 meq/L (ref 137–147)
TOTAL PROTEIN: 8.5 g/dL — AB (ref 6.0–8.3)
Total Bilirubin: 0.4 mg/dL (ref 0.3–1.2)

## 2014-04-04 LAB — CBC WITH DIFFERENTIAL/PLATELET
BASOS PCT: 0 % (ref 0–1)
Basophils Absolute: 0 10*3/uL (ref 0.0–0.1)
EOS ABS: 0.1 10*3/uL (ref 0.0–0.7)
EOS PCT: 2 % (ref 0–5)
HEMATOCRIT: 39.8 % (ref 36.0–46.0)
Hemoglobin: 13.3 g/dL (ref 12.0–15.0)
LYMPHS PCT: 33 % (ref 12–46)
Lymphs Abs: 2.2 10*3/uL (ref 0.7–4.0)
MCH: 28.8 pg (ref 26.0–34.0)
MCHC: 33.4 g/dL (ref 30.0–36.0)
MCV: 86.1 fL (ref 78.0–100.0)
MONO ABS: 0.6 10*3/uL (ref 0.1–1.0)
Monocytes Relative: 10 % (ref 3–12)
Neutro Abs: 3.7 10*3/uL (ref 1.7–7.7)
Neutrophils Relative %: 55 % (ref 43–77)
PLATELETS: 247 10*3/uL (ref 150–400)
RBC: 4.62 MIL/uL (ref 3.87–5.11)
RDW: 14.2 % (ref 11.5–15.5)
WBC: 6.6 10*3/uL (ref 4.0–10.5)

## 2014-04-04 NOTE — Progress Notes (Signed)
[facility]  OFFICE PROGRESS Jolee Ewing, MD [address] [place] [address]  DIAGNOSIS: Adenocarcinoma of colon - Plan: CBC with Differential, Comprehensive metabolic panel, CEA, CBC with Differential, Comprehensive metabolic panel, CEA  Chief Complaint  Patient presents with  . Stage II colon cancer    CURRENT THERAPY: Watchful expectation and surveillance  NTERVAL HISTORY: Katrina Arnold 80 y.o. female returns for followup of stage II A. adenocarcinoma of colon status post right hemicolectomy in June 2009 by Dr. Geroge Baseman not require chemotherapy. She is S/P colonoscopy on 07/02/2012 at which time tubular adenomas were found.  She does suffer with lower extremity paresthesias. Bowel movements are regular with no hematochezia, melena, or incontinence. Appetite has been good with no nausea, vomiting, PND, orthopnea, palpitations, lower extremity swelling or redness, cough, wheezing, chest pain, headache, or seizures.   MEDICAL HISTORY: Past Medical History  Diagnosis Date  . Atrial fibrillation, chronic     normal EF  . Diabetes mellitus, type 2   . Hypertension   . Hyperlipidemia   . Cerebrovascular disease     Left cerebral CVA 2007; residual right sided weakness; 7/09 mild plaque; no focal stenosis  . Chronic kidney disease, stage 3, mod decreased GFR     Creatinine of 1.5 in 10/2008; h/o hyper- and hypokalemia  . Dupuytren's contracture   . Chronic anticoagulation   . Colon adenocarcinoma 2009    s/p right hemicolectomy    INTERIM HISTORY: has Hx of Adenocarcinoma of colon; DIABETES MELLITUS, TYPE II; HYPERLIPIDEMIA; ANEMIA; Hypertension; Atrial fibrillation; CEREBROVASCULAR DISEASE; CHRONIC KIDNEY DISEASE STAGE II (MILD); Chronic anticoagulation; and Encounter for therapeutic drug monitoring on her problem list.    ALLERGIES:  has No Known Allergies.  MEDICATIONS: has a current medication list which includes  the following prescription(s): amlodipine, glipizide, hydrochlorothiazide, metformin, metoprolol, omeprazole, potassium chloride sa, simvastatin, warfarin, and acetaminophen.  SURGICAL HISTORY:  Past Surgical History  Procedure Laterality Date  . Hemicolectomy  12/2007    Right, adenocarcinoma  . Tubal ligation    . Appendectomy    . Colonoscopy w/ polypectomy  05/2009    Rourk: internal hemorrhoidal tag, site of piecemeal polypectomy in  August identified, residual polypectomy.  Multiple tubular adenomas/tubulovillous  . Colonoscopy  6/23/200    Rourk: Left diverticula, multiple polyps, adenocarcinoma of colon 8 cm distal to ICV  . Colonoscopy  07/02/2012    Procedure: COLONOSCOPY;  Surgeon: Daneil Dolin, MD;  Location: [facility];  Service: Endoscopy;  Laterality: N/A;  9:30    FAMILY HISTORY: family history includes Diabetes in her mother; Heart attack in her father; Lung cancer in her son.  SOCIAL HISTORY:  reports that she has never smoked. Her smokeless tobacco use includes Snuff. She reports that she does not drink alcohol or use illicit drugs.  REVIEW OF SYSTEMS:  Other than that discussed above is noncontributory.  PHYSICAL EXAMINATION: ECOG PERFORMANCE STATUS: 1 - Symptomatic but completely ambulatory  Blood pressure 134/70, pulse 70, temperature 98.3 F (36.8 C), temperature source Oral, resp. rate 18, weight 170 lb 6.4 oz (77.293 kg), SpO2 100.00%.  GENERAL:alert, no distress and comfortable. Moderately obese. SKIN: skin color, texture, turgor are normal, no rashes or significant lesions EYES: PERLA; Conjunctiva are pink and non-injected, sclera clear SINUSES: No redness or tenderness over maxillary or ethmoid sinuses OROPHARYNX:no exudate, no erythema on lips, buccal mucosa, or tongue. NECK: supple, thyroid normal size, non-tender, without nodularity. No  masses CHEST:  normal AP diameter with no breast masses. [place]:  no palpable lymphadenopathy in the cervical,  axillary or inguinal LUNGS: clear to auscultation and percussion with normal breathing effort HEART: Irregularly irregular with no S3. ABDOMEN:abdomen soft, non-tender and normal bowel sounds MUSCULOSKELETAL:no cyanosis of digits and no clubbing. Range of motion normal.  NEURO: alert & oriented x 3 with fluent speech, no focal motor/sensory deficits   LABORATORY DATA: Office Visit on 04/04/2014  Component Date Value Ref Range Status  . WBC 04/04/2014 6.6  4.0 - 10.5 K/uL Final  . RBC 04/04/2014 4.62  3.87 - 5.11 MIL/uL Final  . Hemoglobin 04/04/2014 13.3  12.0 - 15.0 g/dL Final  . HCT 04/04/2014 39.8  36.0 - 46.0 % Final  . MCV 04/04/2014 86.1  78.0 - 100.0 fL Final  . MCH 04/04/2014 28.8  26.0 - 34.0 pg Final  . MCHC 04/04/2014 33.4  30.0 - 36.0 g/dL Final  . RDW 04/04/2014 14.2  11.5 - 15.5 % Final  . Platelets 04/04/2014 247  150 - 400 K/uL Final  . Neutrophils Relative % 04/04/2014 55  43 - 77 % Final  . Neutro Abs 04/04/2014 3.7  1.7 - 7.7 K/uL Final  . Lymphocytes Relative 04/04/2014 33  12 - 46 % Final  . Lymphs Abs 04/04/2014 2.2  0.7 - 4.0 K/uL Final  . Monocytes Relative 04/04/2014 10  3 - 12 % Final  . Monocytes Absolute 04/04/2014 0.6  0.1 - 1.0 K/uL Final  . Eosinophils Relative 04/04/2014 2  0 - 5 % Final  . Eosinophils Absolute 04/04/2014 0.1  0.0 - 0.7 K/uL Final  . Basophils Relative 04/04/2014 0  0 - 1 % Final  . Basophils Absolute 04/04/2014 0.0  0.0 - 0.1 K/uL Final  . Sodium 04/04/2014 140  137 - 147 mEq/L Final  . Potassium 04/04/2014 3.9  3.7 - 5.3 mEq/L Final  . Chloride 04/04/2014 99  96 - 112 mEq/L Final  . CO2 04/04/2014 26  19 - 32 mEq/L Final  . Glucose, Bld 04/04/2014 187* 70 - 99 mg/dL Final  . BUN 04/04/2014 13  6 - 23 mg/dL Final  . Creatinine, Ser 04/04/2014 0.95  0.50 - 1.10 mg/dL Final  . Calcium 04/04/2014 9.4  8.4 - 10.5 mg/dL Final  . Total Protein 04/04/2014 8.5* 6.0 - 8.3 g/dL Final  . Albumin 04/04/2014 4.1  3.5 - 5.2 g/dL Final  . AST  04/04/2014 24  0 - 37 U/L Final  . ALT 04/04/2014 9  0 - 35 U/L Final  . Alkaline Phosphatase 04/04/2014 169* 39 - 117 U/L Final  . Total Bilirubin 04/04/2014 0.4  0.3 - 1.2 mg/dL Final  . GFR calc non Af Amer 04/04/2014 55* >90 mL/min Final  . GFR calc Af Amer 04/04/2014 64* >90 mL/min Final   Comment: (NOTE)                          The eGFR has been calculated using the CKD EPI equation.                          This calculation has not been validated in all clinical situations.                          eGFR's persistently <90 mL/min signify possible Chronic Kidney  Disease.  . Anion gap 04/04/2014 15  5 - 15 Final  Anti-coag visit on 03/27/2014  Component Date Value Ref Range Status  . INR 03/27/2014 2.7   Final    PATHOLOGY: Colonoscopy 07/02/2012: for MARKITTA, AUSBURN ([id number]) Patient Name: NAELLE, DIEGEL Accession #: [id number] DOB: 03-Feb-1933 Age: 21 Gender: F Client Name [facility] Date: 07/02/2012 Received Date: 07/02/2012 Physician: Manus Rudd Chart #: MRN # : [id number] Physician cc: Race: B Visit #: [id number] REPORT OF SURGICAL PATHOLOGY FINAL DIAGNOSIS Diagnosis 1. Colon, polyp(s), descending - TUBULAR ADENOMAS. - HIGH GRADE DYSPLASIA IS NOT IDENTIFIED. 2. Small intestine, polyp(s), distal end of anastomosis of small bowel - TUBULAR ADENOMAS. - HIGH GRADE DYSPLASIA IS NOT IDENTIFIED. Enid Cutter MD Pathologist, Electronic Signature (Case signed 07/03/2012) Specimen Gross and Clinical Information Specimen(s) Obtained: 1. Colon, polyp(s), descending 2. Small intestine, polyp(s), distal end of anastomosis of small bowel Specimen Clinical Information 2. Pre-op: history of colon cancer; Post-op: 3 polyps of distal end of anastomosis of small bowel; descending colon polyps, poor prep Gross 1. Received in formalin are tan, soft tissue fragments that are submitted in toto. Number: 2, Size: 0.4 and 0.5 cm (Aggregate  measurement: 0.6 x 0.5 x 0.2 cm) (1B) 2. Received in formalin are tan, soft tissue fragments that are submitted in toto. Number: 3, Size: each 0.3 cm, (1 B) (GRP:kh 07-02-12) Report signed out from the following location(s) [facility] [place], [place], [place] [address]. CLIA #: [id number], [place], [employer], [place] [address]. CLIA #: [id number],  Urinalysis    Component Value Date/Time   COLORURINE YELLOW 07/06/2009 1903   APPEARANCEUR CLOUDY* 07/06/2009 1903   LABSPEC 1.023 07/06/2009 1903   PHURINE 6.0 07/06/2009 1903   GLUCOSEU NEG mg/dL 07/06/2009 1903   BILIRUBINUR SMALL* 07/06/2009 1903   KETONESUR NEG mg/dL 07/06/2009 1903   PROTEINUR 100* 07/06/2009 1903   UROBILINOGEN 1 07/06/2009 1903   NITRITE NEG 07/06/2009 1903   LEUKOCYTESUR SMALL* 07/06/2009 1903    RADIOGRAPHIC STUDIES: None.  ASSESSMENT:  #1. Stage II-aA adenocarcinoma of the colon, status post right hemicolectomy in June 2009, no postop therapy, status post colonoscopy in December 2013 at which time tubular adenomas were found. #2. Diabetes mellitus, type II, non-insulin requiring, with neuropathy. #3. Chronic atrial fibrillation, on warfarin.  PLAN:  #1. Continue watchful expectation. #2. Followup in one year with CBC, chem profile, CEA.   All questions were answered. The patient knows to call the clinic with any problems, questions or concerns. We can certainly see the patient much sooner if necessary.   I spent 25 minutes counseling the patient face to face. The total time spent in the appointment was 30 minutes.    Doroteo Bradford, MD 04/04/2014 12:05 PM  DISCLAIMER:  This note was dictated with voice recognition software.  Similar sounding words can inadvertently be transcribed inaccurately and may not be corrected upon review.

## 2014-04-04 NOTE — Patient Instructions (Signed)
[facility] Discharge Instructions  RECOMMENDATIONS MADE BY THE CONSULTANT AND ANY TEST RESULTS WILL BE SENT TO YOUR REFERRING PHYSICIAN.  EXAM FINDINGS BY THE PHYSICIAN TODAY AND SIGNS OR SYMPTOMS TO REPORT TO CLINIC OR PRIMARY PHYSICIAN: Exam and findings as discussed by Dr. Barnet Glasgow.  You are doing well.  Will check labs today and if any concerns we will call you.  Report unexplained weight loss, changes in bowel habits or other concerns.    INSTRUCTIONS/FOLLOW-UP: Return in 1 year with labs and office visit.  Thank you for choosing [facility] to provide your oncology and hematology care.  To afford each patient quality time with our providers, please arrive at least 15 minutes before your scheduled appointment time.  With your help, our goal is to use those 15 minutes to complete the necessary work-up to ensure our physicians have the information they need to help with your evaluation and healthcare recommendations.    Effective January 1st, 2014, we ask that you re-schedule your appointment with our physicians should you arrive 10 or more minutes late for your appointment.  We strive to give you quality time with our providers, and arriving late affects you and other patients whose appointments are after yours.    Again, thank you for choosing [facility].  Our hope is that these requests will decrease the amount of time that you wait before being seen by our physicians.       _____________________________________________________________  Should you have questions after your visit to [facility], please contact our office at [phone] between the hours of 8:30 a.m. and 4:30 p.m.  Voicemails left after 4:30 p.m. will not be returned until the following business day.  For prescription refill requests, have your pharmacy contact our office with your prescription refill request.     _______________________________________________________________  We hope that we have given you very good care.  You may receive a patient satisfaction survey in the mail, please complete it and return it as soon as possible.  We value your feedback!  _______________________________________________________________  Have you asked about our STAR program?  STAR stands for Survivorship Training and Rehabilitation, and this is a nationally recognized cancer care program that focuses on survivorship and rehabilitation.  Cancer and cancer treatments may cause problems, such as, pain, making you feel tired and keeping you from doing the things that you need or want to do. Cancer rehabilitation can help. Our goal is to reduce these troubling effects and help you have the best quality of life possible.  You may receive a survey from a nurse that asks questions about your current state of health.  Based on the survey results, all eligible patients will be referred to the [facility] program for an evaluation so we can better serve you!  A frequently asked questions sheet is available upon request.

## 2014-04-04 NOTE — Progress Notes (Signed)
Kameshia S Rapaport presented for [employer]. Labs per MD order drawn via Peripheral Line 23 gauge needle inserted in left hand  Good blood return present. Procedure without incident.  Needle removed intact. Patient tolerated procedure well.  STAR Program Physical Impairment and Functional Assessment Screening Tool  1. Are you having any pain, including headaches, joint pain, or muscle pain (upper body = OT; lower body = PT)?  No  2. Do your hands and/or feet feel numb or tingle (PT)?  Yes, but I hand this before my cancer diagnosis.  3. Does any part of your body feel swollen or larger than usual (upper body = OT; lower body = PT)?  No  4. Are you so tired that you cannot do the things you want or need to do (PT or OT)?  No  5. Are you feeling weak or are you having trouble moving any part of your body (PT/OT)?  No  6. Are you having trouble concentrating, thinking, or remembering things (OT/ST)?  No  7. Are you having trouble moving around or feel like you might trip or fall (PT)?  No  8. Are you having trouble swallowing (ST)?  No  9. Are you having trouble speaking (ST)?  No  10. Are you having trouble with going or getting to the bathroom (OT)?  No  11. Are you having trouble with your sexual function (OT)?  No  12. Are you having trouble lifting things, even just your arms (OT/PT)?  No  13. Are you having trouble taking care of yourself as in dressing or bathing (OT)?  No  14. Are you having trouble with daily tasks like chores or shopping (OT)?  No  15. Are you having trouble driving (OT)?  No  16. Are you having trouble returning to work or completing your tasks at work (OT)?  No  Other concerns:    Legend: OT = Occupational Therapy PT = Physical Therapy ST = Speech Therapy

## 2014-04-05 LAB — CEA: CEA: 3.1 ng/mL (ref 0.0–5.0)

## 2014-05-07 ENCOUNTER — Ambulatory Visit (INDEPENDENT_AMBULATORY_CARE_PROVIDER_SITE_OTHER): Payer: Medicare Other | Admitting: *Deleted

## 2014-05-07 DIAGNOSIS — Z7901 Long term (current) use of anticoagulants: Secondary | ICD-10-CM

## 2014-05-07 DIAGNOSIS — Z5181 Encounter for therapeutic drug level monitoring: Secondary | ICD-10-CM

## 2014-05-07 DIAGNOSIS — I4891 Unspecified atrial fibrillation: Secondary | ICD-10-CM

## 2014-05-07 LAB — POCT INR: INR: 2.4

## 2014-06-03 ENCOUNTER — Telehealth: Payer: Self-pay | Admitting: Cardiology

## 2014-06-03 MED ORDER — WARFARIN SODIUM 5 MG PO TABS: ORAL_TABLET | ORAL | Status: DC

## 2014-06-03 NOTE — Telephone Encounter (Signed)
Received fax refill request  Rx # [id number] Medication:  Warfarin Sod 5 mg tablets Qty 30 Sig:  Take one tablet by mouth daily except take 1/2 tablet by mouth on Mondays and Thursdays or as directed  Physician:  Harl Bowie

## 2014-06-18 ENCOUNTER — Ambulatory Visit (INDEPENDENT_AMBULATORY_CARE_PROVIDER_SITE_OTHER): Payer: Medicare Other | Admitting: *Deleted

## 2014-06-18 DIAGNOSIS — Z7901 Long term (current) use of anticoagulants: Secondary | ICD-10-CM

## 2014-06-18 DIAGNOSIS — Z5181 Encounter for therapeutic drug level monitoring: Secondary | ICD-10-CM

## 2014-06-18 DIAGNOSIS — I4891 Unspecified atrial fibrillation: Secondary | ICD-10-CM

## 2014-06-18 LAB — POCT INR: INR: 1.8

## 2014-07-14 ENCOUNTER — Ambulatory Visit (INDEPENDENT_AMBULATORY_CARE_PROVIDER_SITE_OTHER): Payer: Medicare Other | Admitting: *Deleted

## 2014-07-14 DIAGNOSIS — Z7901 Long term (current) use of anticoagulants: Secondary | ICD-10-CM

## 2014-07-14 DIAGNOSIS — Z5181 Encounter for therapeutic drug level monitoring: Secondary | ICD-10-CM

## 2014-07-14 DIAGNOSIS — I4891 Unspecified atrial fibrillation: Secondary | ICD-10-CM

## 2014-07-14 LAB — POCT INR: INR: 4.6

## 2014-07-30 ENCOUNTER — Ambulatory Visit (INDEPENDENT_AMBULATORY_CARE_PROVIDER_SITE_OTHER): Payer: Commercial Managed Care - HMO | Admitting: *Deleted

## 2014-07-30 DIAGNOSIS — I480 Paroxysmal atrial fibrillation: Secondary | ICD-10-CM

## 2014-07-30 DIAGNOSIS — Z5181 Encounter for therapeutic drug level monitoring: Secondary | ICD-10-CM

## 2014-07-30 DIAGNOSIS — Z7901 Long term (current) use of anticoagulants: Secondary | ICD-10-CM

## 2014-07-30 DIAGNOSIS — I4891 Unspecified atrial fibrillation: Secondary | ICD-10-CM

## 2014-07-30 LAB — POCT INR: INR: 3

## 2014-08-20 ENCOUNTER — Ambulatory Visit (INDEPENDENT_AMBULATORY_CARE_PROVIDER_SITE_OTHER): Payer: Commercial Managed Care - HMO | Admitting: *Deleted

## 2014-08-20 DIAGNOSIS — Z7901 Long term (current) use of anticoagulants: Secondary | ICD-10-CM

## 2014-08-20 DIAGNOSIS — I4891 Unspecified atrial fibrillation: Secondary | ICD-10-CM

## 2014-08-20 DIAGNOSIS — Z5181 Encounter for therapeutic drug level monitoring: Secondary | ICD-10-CM

## 2014-08-20 DIAGNOSIS — I480 Paroxysmal atrial fibrillation: Secondary | ICD-10-CM

## 2014-08-20 LAB — POCT INR: INR: 2.3

## 2014-09-17 ENCOUNTER — Ambulatory Visit (INDEPENDENT_AMBULATORY_CARE_PROVIDER_SITE_OTHER): Payer: Medicare HMO | Admitting: *Deleted

## 2014-09-17 DIAGNOSIS — Z7901 Long term (current) use of anticoagulants: Secondary | ICD-10-CM

## 2014-09-17 DIAGNOSIS — Z5181 Encounter for therapeutic drug level monitoring: Secondary | ICD-10-CM

## 2014-09-17 DIAGNOSIS — I4891 Unspecified atrial fibrillation: Secondary | ICD-10-CM

## 2014-09-17 DIAGNOSIS — I480 Paroxysmal atrial fibrillation: Secondary | ICD-10-CM

## 2014-09-17 LAB — POCT INR: INR: 2.9

## 2014-09-25 ENCOUNTER — Telehealth: Payer: Self-pay | Admitting: *Deleted

## 2014-09-25 MED ORDER — WARFARIN SODIUM 5 MG PO TABS: ORAL_TABLET | ORAL | Status: DC

## 2014-09-25 NOTE — Telephone Encounter (Signed)
Warfarin 5mg  #30

## 2014-09-26 ENCOUNTER — Other Ambulatory Visit: Payer: Self-pay | Admitting: *Deleted

## 2014-09-26 MED ORDER — WARFARIN SODIUM 5 MG PO TABS: ORAL_TABLET | ORAL | Status: DC

## 2014-10-29 ENCOUNTER — Ambulatory Visit (INDEPENDENT_AMBULATORY_CARE_PROVIDER_SITE_OTHER): Payer: Medicare HMO | Admitting: *Deleted

## 2014-10-29 DIAGNOSIS — Z7901 Long term (current) use of anticoagulants: Secondary | ICD-10-CM

## 2014-10-29 DIAGNOSIS — I4891 Unspecified atrial fibrillation: Secondary | ICD-10-CM

## 2014-10-29 DIAGNOSIS — Z5181 Encounter for therapeutic drug level monitoring: Secondary | ICD-10-CM | POA: Diagnosis not present

## 2014-10-29 DIAGNOSIS — I480 Paroxysmal atrial fibrillation: Secondary | ICD-10-CM | POA: Diagnosis not present

## 2014-10-29 LAB — POCT INR: INR: 2.3

## 2015-01-07 ENCOUNTER — Ambulatory Visit (INDEPENDENT_AMBULATORY_CARE_PROVIDER_SITE_OTHER): Payer: Medicare HMO | Admitting: *Deleted

## 2015-01-07 DIAGNOSIS — Z5181 Encounter for therapeutic drug level monitoring: Secondary | ICD-10-CM | POA: Diagnosis not present

## 2015-01-07 DIAGNOSIS — Z7901 Long term (current) use of anticoagulants: Secondary | ICD-10-CM | POA: Diagnosis not present

## 2015-01-07 DIAGNOSIS — I4891 Unspecified atrial fibrillation: Secondary | ICD-10-CM | POA: Diagnosis not present

## 2015-01-07 LAB — POCT INR: INR: 3.1

## 2015-01-20 ENCOUNTER — Other Ambulatory Visit: Payer: Self-pay | Admitting: *Deleted

## 2015-01-20 ENCOUNTER — Other Ambulatory Visit: Payer: Self-pay | Admitting: Cardiology

## 2015-01-20 MED ORDER — WARFARIN SODIUM 5 MG PO TABS: ORAL_TABLET | ORAL | Status: DC

## 2015-03-04 ENCOUNTER — Ambulatory Visit (INDEPENDENT_AMBULATORY_CARE_PROVIDER_SITE_OTHER): Payer: Medicare HMO | Admitting: *Deleted

## 2015-03-04 DIAGNOSIS — Z5181 Encounter for therapeutic drug level monitoring: Secondary | ICD-10-CM

## 2015-03-04 DIAGNOSIS — Z7901 Long term (current) use of anticoagulants: Secondary | ICD-10-CM

## 2015-03-04 DIAGNOSIS — I4891 Unspecified atrial fibrillation: Secondary | ICD-10-CM

## 2015-03-04 LAB — POCT INR: INR: 2.1

## 2015-03-19 ENCOUNTER — Other Ambulatory Visit (HOSPITAL_COMMUNITY): Payer: Self-pay

## 2015-03-19 DIAGNOSIS — C189 Malignant neoplasm of colon, unspecified: Secondary | ICD-10-CM

## 2015-04-03 ENCOUNTER — Ambulatory Visit (HOSPITAL_COMMUNITY): Payer: Medicare HMO

## 2015-04-03 ENCOUNTER — Ambulatory Visit (HOSPITAL_COMMUNITY): Payer: Medicare Other | Admitting: Hematology & Oncology

## 2015-04-03 ENCOUNTER — Encounter (HOSPITAL_BASED_OUTPATIENT_CLINIC_OR_DEPARTMENT_OTHER): Payer: Medicare HMO | Admitting: Hematology & Oncology

## 2015-04-03 ENCOUNTER — Ambulatory Visit (HOSPITAL_COMMUNITY): Payer: Medicare HMO | Admitting: Hematology & Oncology

## 2015-04-03 ENCOUNTER — Other Ambulatory Visit (HOSPITAL_COMMUNITY): Payer: Medicare Other

## 2015-04-03 ENCOUNTER — Encounter (HOSPITAL_COMMUNITY): Payer: Medicare HMO | Attending: Hematology & Oncology

## 2015-04-03 ENCOUNTER — Encounter (HOSPITAL_COMMUNITY): Payer: Self-pay | Admitting: Hematology & Oncology

## 2015-04-03 VITALS — BP 149/71 | HR 76 | Temp 98.1°F | Resp 18 | Wt 162.7 lb

## 2015-04-03 DIAGNOSIS — Z85038 Personal history of other malignant neoplasm of large intestine: Secondary | ICD-10-CM

## 2015-04-03 DIAGNOSIS — C189 Malignant neoplasm of colon, unspecified: Secondary | ICD-10-CM

## 2015-04-03 LAB — COMPREHENSIVE METABOLIC PANEL
ALT: 14 U/L (ref 14–54)
AST: 30 U/L (ref 15–41)
Albumin: 4 g/dL (ref 3.5–5.0)
Alkaline Phosphatase: 130 U/L — ABNORMAL HIGH (ref 38–126)
Anion gap: 10 (ref 5–15)
BUN: 11 mg/dL (ref 6–20)
CO2: 27 mmol/L (ref 22–32)
CREATININE: 1.03 mg/dL — AB (ref 0.44–1.00)
Calcium: 8.8 mg/dL — ABNORMAL LOW (ref 8.9–10.3)
Chloride: 103 mmol/L (ref 101–111)
GFR calc non Af Amer: 50 mL/min — ABNORMAL LOW (ref >60)
GFR, EST AFRICAN AMERICAN: 58 mL/min — AB (ref >60)
Glucose, Bld: 114 mg/dL — ABNORMAL HIGH (ref 65–99)
Potassium: 3.8 mmol/L (ref 3.5–5.1)
SODIUM: 140 mmol/L (ref 135–145)
Total Bilirubin: 0.4 mg/dL (ref 0.3–1.2)
Total Protein: 7.8 g/dL (ref 6.5–8.1)

## 2015-04-03 LAB — CBC WITH DIFFERENTIAL/PLATELET
BASOS PCT: 0 %
Basophils Absolute: 0 10*3/uL (ref 0.0–0.1)
EOS ABS: 0.1 10*3/uL (ref 0.0–0.7)
Eosinophils Relative: 2 %
HCT: 38.3 % (ref 36.0–46.0)
Hemoglobin: 12.4 g/dL (ref 12.0–15.0)
Lymphocytes Relative: 40 %
Lymphs Abs: 2.9 10*3/uL (ref 0.7–4.0)
MCH: 28.2 pg (ref 26.0–34.0)
MCHC: 32.4 g/dL (ref 30.0–36.0)
MCV: 87 fL (ref 78.0–100.0)
Monocytes Absolute: 0.7 10*3/uL (ref 0.1–1.0)
Monocytes Relative: 10 %
Neutro Abs: 3.4 10*3/uL (ref 1.7–7.7)
Neutrophils Relative %: 48 %
Platelets: 225 10*3/uL (ref 150–400)
RBC: 4.4 MIL/uL (ref 3.87–5.11)
RDW: 14.7 % (ref 11.5–15.5)
WBC: 7.1 10*3/uL (ref 4.0–10.5)

## 2015-04-03 NOTE — Progress Notes (Signed)
[facility], [place] [address]  H/O colon cancer, stage II - Plan: CBC with Differential, Comprehensive metabolic panel, CEA  CURRENT THERAPY: Observation  INTERVAL HISTORY: Katrina Arnold 81 y.o. female returns for  regular  visit for followup of stage II A. adenocarcinoma of colon status post right hemicolectomy in June 2009 by Dr. Geroge Baseman not require chemotherapy. She is S/P colonoscopy in November 2013.   She denies any complaints and oncologic ROS questioning is negative.   Katrina Arnold is here alone today. She is a very good-natured and pleasant lady, always smiling and frequently laughing.  She says she's been doing fine and eating fine, but only sleeping in little bits here and there. She sleeps maybe 2 to 4 hours at a time, but she sleeps whenever she feels like it.  She has not been getting mammograms lately.  Katrina Arnold denies any blood in her stool.  She does say that her ears have been itching "real bad, way down in there," but upon examination her ears look good and healthy. Her eyes are also itchy, likely allergies.  Gesturing with her right arm, she says "this is my bad arm," and it "ain't been right since" her stroke. She shows good range of motion in her arm, however, and works daily on keeping up her strength.  She confirms that she's been a little wobbly lately, but has only fallen back around when she had her stroke.  She says she likes to fish, and cook and clean what she catches.  She has a great support system, and says with a chuckle that maybe her children look after her too much, "driving her up the wall."  All of her family is around here, including her seven children. She has grandchildren and great-grandchildren, about 106 great-grandchildren and a similar number of grandchildren.   Past Medical History  Diagnosis Date  . Atrial fibrillation, chronic     normal EF  . Diabetes mellitus, type 2   . Hypertension   .  Hyperlipidemia   . Cerebrovascular disease     Left cerebral CVA 2007; residual right sided weakness; 7/09 mild plaque; no focal stenosis  . Chronic kidney disease, stage 3, mod decreased GFR     Creatinine of 1.5 in 10/2008; h/o hyper- and hypokalemia  . Dupuytren's contracture   . Chronic anticoagulation   . Colon adenocarcinoma 2009    s/p right hemicolectomy    has Hx of Adenocarcinoma of colon; DIABETES MELLITUS, TYPE II; HYPERLIPIDEMIA; ANEMIA; Hypertension; Atrial fibrillation; CEREBROVASCULAR DISEASE; CHRONIC KIDNEY DISEASE STAGE II (MILD); Chronic anticoagulation; and Encounter for therapeutic drug monitoring on her problem list.     has No Known Allergies.  Katrina Arnold had no medications administered during this visit.  Past Surgical History  Procedure Laterality Date  . Hemicolectomy  12/2007    Right, adenocarcinoma  . Tubal ligation    . Appendectomy    . Colonoscopy w/ polypectomy  05/2009    Rourk: internal hemorrhoidal tag, site of piecemeal polypectomy in  August identified, residual polypectomy.  Multiple tubular adenomas/tubulovillous  . Colonoscopy  6/23/200    Rourk: Left diverticula, multiple polyps, adenocarcinoma of colon 8 cm distal to ICV  . Colonoscopy  07/02/2012    Procedure: COLONOSCOPY;  Surgeon: Daneil Dolin, MD;  Location[facility];  Service: Endoscopy;  Laterality: N/A;  9:30    Denies any headaches, dizziness, double vision, fevers, chills, night sweats, nausea, vomiting, diarrhea,  constipation, chest pain, heart palpitations, shortness of breath, blood in stool, black tarry stool, urinary pain, urinary burning, urinary frequency, hematuria.  14 point review of systems was performed and is negative except as detailed under history of present illness and above    PHYSICAL EXAMINATION  ECOG PERFORMANCE STATUS: 1 - Symptomatic but completely ambulatory  Filed Vitals:   04/03/15 1108  BP: 149/71  Pulse: 76  Temp: 98.1 F (36.7 C)  Resp:  18    GENERAL:alert, no distress, well nourished, well developed, comfortable, cooperative and smiling SKIN: skin color, texture, turgor are normal, no rashes or significant lesions HEAD: Normocephalic, No masses, lesions, tenderness or abnormalities EYES: normal, PERRLA, EOMI, Conjunctiva are pink and non-injected EARS: External ears normal. TM's visualized bilaterally and clear OROPHARYNX:mucous membranes are moist  NECK: supple, no adenopathy, thyroid normal size, non-tender, without nodularity, no stridor, non-tender, trachea midline LYMPH:  no palpable lymphadenopathy, no hepatosplenomegaly BREAST:not examined LUNGS: clear to auscultation and percussion HEART: regular rate & rhythm, no murmurs, no gallops, S1 normal and S2 normal ABDOMEN:abdomen soft, non-tender, obese and normal bowel sounds BACK: Back symmetric, no curvature. EXTREMITIES:less then 2 second capillary refill, no joint deformities, effusion, or inflammation, no edema, no skin discoloration, no clubbing, no cyanosis  NEURO: alert & oriented x 3 with fluent speech, no focal motor/sensory deficits, gait normal   LABORATORY DATA: Lab Results  Component Value Date   CEA 3.1 04/04/2014    ASSESSMENT:  1. Stage II A. adenocarcinoma of colon status post right hemicolectomy in June 2009 by Dr. Geroge Baseman not require chemotherapy. She is S/P colonoscopy in November 2013.   Patient Active Problem List   Diagnosis Date Noted  . Encounter for therapeutic drug monitoring 09/25/2013  . Chronic anticoagulation 10/13/2010  . CEREBROVASCULAR DISEASE 11/17/2009  . Hx of Adenocarcinoma of colon 01/05/2009  . DIABETES MELLITUS, TYPE II 01/05/2009  . HYPERLIPIDEMIA 01/05/2009  . ANEMIA 01/05/2009  . Hypertension 01/05/2009  . Atrial fibrillation 01/05/2009  . CHRONIC KIDNEY DISEASE STAGE II (MILD) 01/05/2009     PLAN:  1. Patient has completed 5 years of surveillance. 2. Will follow NCCN guidelines for surveillance.  3.  Follow-up with GI as directed 4. Follow-up with PCP as directed 5. Return in 1 year for follow-up   THERAPY PLAN:  She has completed 5 years of CEA surveillance per NCCN guidelines.  Therefore, we will see her annually.   All questions were answered. The patient knows to call the clinic with any problems, questions or concerns. We can certainly see the patient much sooner if necessary.  This document serves as a record of services personally performed by Ancil Linsey, MD. It was created on her behalf by Toni Amend, a trained medical scribe. The creation of this record is based on the scribe's personal observations and the provider's statements to them. This document has been checked and approved by the attending provider.  I have reviewed the above documentation for accuracy and completeness, and I agree with the above.  Molli Hazard, MD

## 2015-04-03 NOTE — Patient Instructions (Signed)
[facility] at [facility] Discharge Instructions  RECOMMENDATIONS MADE BY THE CONSULTANT AND ANY TEST RESULTS WILL BE SENT TO YOUR REFERRING PHYSICIAN.  Exam per Dr.Penland. Return to clinic in 1 year for follow up. Report any issues/concerns to clinic as needed. Return as scheduled.  Thank you for choosing [facility] at [facility] to provide your oncology and hematology care.  To afford each patient quality time with our Katrina Arnold, please arrive at least 15 minutes before your scheduled appointment time.    You need to re-schedule your appointment should you arrive 10 or more minutes late.  We strive to give you quality time with our providers, and arriving late affects you and other patients whose appointments are after yours.  Also, if you no show three or more times for appointments you may be dismissed from the clinic at the providers discretion.     Again, thank you for choosing [facility].  Our hope is that these requests will decrease the amount of time that you wait before being seen by our physicians.       _____________________________________________________________  Should you have questions after your visit to [facility], please contact our office at [phone] between the hours of 8:30 a.m. and 4:30 p.m.  Voicemails left after 4:30 p.m. will not be returned until the following business day.  For prescription refill requests, have your pharmacy contact our office.

## 2015-04-04 LAB — CEA: CEA: 6.7 ng/mL — ABNORMAL HIGH (ref 0.0–4.7)

## 2015-04-06 ENCOUNTER — Other Ambulatory Visit (HOSPITAL_COMMUNITY): Payer: Self-pay | Admitting: Oncology

## 2015-04-06 DIAGNOSIS — C189 Malignant neoplasm of colon, unspecified: Secondary | ICD-10-CM

## 2015-04-06 NOTE — Progress Notes (Signed)
LABS DRAWN

## 2015-04-09 ENCOUNTER — Encounter (HOSPITAL_COMMUNITY): Payer: Self-pay

## 2015-04-15 ENCOUNTER — Ambulatory Visit (INDEPENDENT_AMBULATORY_CARE_PROVIDER_SITE_OTHER): Payer: Medicare HMO | Admitting: *Deleted

## 2015-04-15 DIAGNOSIS — Z7901 Long term (current) use of anticoagulants: Secondary | ICD-10-CM

## 2015-04-15 DIAGNOSIS — Z5181 Encounter for therapeutic drug level monitoring: Secondary | ICD-10-CM

## 2015-04-15 DIAGNOSIS — I4891 Unspecified atrial fibrillation: Secondary | ICD-10-CM | POA: Diagnosis not present

## 2015-04-15 LAB — POCT INR: INR: 1.8

## 2015-05-05 DIAGNOSIS — R69 Illness, unspecified: Secondary | ICD-10-CM | POA: Diagnosis not present

## 2015-05-13 ENCOUNTER — Ambulatory Visit (INDEPENDENT_AMBULATORY_CARE_PROVIDER_SITE_OTHER): Payer: Medicare HMO | Admitting: *Deleted

## 2015-05-13 DIAGNOSIS — I4891 Unspecified atrial fibrillation: Secondary | ICD-10-CM | POA: Diagnosis not present

## 2015-05-13 DIAGNOSIS — Z7901 Long term (current) use of anticoagulants: Secondary | ICD-10-CM | POA: Diagnosis not present

## 2015-05-13 DIAGNOSIS — Z5181 Encounter for therapeutic drug level monitoring: Secondary | ICD-10-CM

## 2015-05-13 LAB — POCT INR: INR: 2.3

## 2015-05-22 ENCOUNTER — Encounter (HOSPITAL_COMMUNITY): Payer: Medicare HMO | Attending: Hematology & Oncology

## 2015-05-22 DIAGNOSIS — C189 Malignant neoplasm of colon, unspecified: Secondary | ICD-10-CM | POA: Diagnosis not present

## 2015-05-22 DIAGNOSIS — Z85038 Personal history of other malignant neoplasm of large intestine: Secondary | ICD-10-CM | POA: Diagnosis not present

## 2015-05-22 NOTE — Progress Notes (Signed)
LABS DRAWN

## 2015-05-23 LAB — CEA: CEA: 6.5 ng/mL — ABNORMAL HIGH (ref 0.0–4.7)

## 2015-05-25 ENCOUNTER — Other Ambulatory Visit: Payer: Self-pay | Admitting: Cardiology

## 2015-06-10 ENCOUNTER — Ambulatory Visit (INDEPENDENT_AMBULATORY_CARE_PROVIDER_SITE_OTHER): Payer: Medicare HMO | Admitting: *Deleted

## 2015-06-10 DIAGNOSIS — Z7901 Long term (current) use of anticoagulants: Secondary | ICD-10-CM | POA: Diagnosis not present

## 2015-06-10 DIAGNOSIS — Z5181 Encounter for therapeutic drug level monitoring: Secondary | ICD-10-CM

## 2015-06-10 DIAGNOSIS — I4891 Unspecified atrial fibrillation: Secondary | ICD-10-CM | POA: Diagnosis not present

## 2015-06-10 LAB — POCT INR: INR: 2.3

## 2015-06-19 DIAGNOSIS — R69 Illness, unspecified: Secondary | ICD-10-CM | POA: Diagnosis not present

## 2015-06-25 DIAGNOSIS — E1165 Type 2 diabetes mellitus with hyperglycemia: Secondary | ICD-10-CM | POA: Diagnosis not present

## 2015-06-25 DIAGNOSIS — N182 Chronic kidney disease, stage 2 (mild): Secondary | ICD-10-CM | POA: Diagnosis not present

## 2015-06-25 DIAGNOSIS — I10 Essential (primary) hypertension: Secondary | ICD-10-CM | POA: Diagnosis not present

## 2015-06-25 DIAGNOSIS — I482 Chronic atrial fibrillation: Secondary | ICD-10-CM | POA: Diagnosis not present

## 2015-07-01 DIAGNOSIS — I482 Chronic atrial fibrillation: Secondary | ICD-10-CM | POA: Diagnosis not present

## 2015-07-01 DIAGNOSIS — I10 Essential (primary) hypertension: Secondary | ICD-10-CM | POA: Diagnosis not present

## 2015-07-22 ENCOUNTER — Ambulatory Visit (INDEPENDENT_AMBULATORY_CARE_PROVIDER_SITE_OTHER): Payer: Medicare HMO | Admitting: *Deleted

## 2015-07-22 DIAGNOSIS — Z7901 Long term (current) use of anticoagulants: Secondary | ICD-10-CM | POA: Diagnosis not present

## 2015-07-22 DIAGNOSIS — I4891 Unspecified atrial fibrillation: Secondary | ICD-10-CM

## 2015-07-22 DIAGNOSIS — Z5181 Encounter for therapeutic drug level monitoring: Secondary | ICD-10-CM | POA: Diagnosis not present

## 2015-07-22 DIAGNOSIS — I480 Paroxysmal atrial fibrillation: Secondary | ICD-10-CM

## 2015-07-22 LAB — POCT INR: INR: 1.5

## 2015-08-04 DIAGNOSIS — R69 Illness, unspecified: Secondary | ICD-10-CM | POA: Diagnosis not present

## 2015-08-05 ENCOUNTER — Ambulatory Visit (INDEPENDENT_AMBULATORY_CARE_PROVIDER_SITE_OTHER): Payer: Medicare HMO | Admitting: Pharmacist

## 2015-08-05 DIAGNOSIS — I480 Paroxysmal atrial fibrillation: Secondary | ICD-10-CM | POA: Diagnosis not present

## 2015-08-05 DIAGNOSIS — Z7901 Long term (current) use of anticoagulants: Secondary | ICD-10-CM | POA: Diagnosis not present

## 2015-08-05 DIAGNOSIS — I4891 Unspecified atrial fibrillation: Secondary | ICD-10-CM

## 2015-08-05 DIAGNOSIS — Z5181 Encounter for therapeutic drug level monitoring: Secondary | ICD-10-CM | POA: Diagnosis not present

## 2015-08-05 LAB — POCT INR: INR: 2

## 2015-08-26 ENCOUNTER — Ambulatory Visit (INDEPENDENT_AMBULATORY_CARE_PROVIDER_SITE_OTHER): Payer: Medicare HMO | Admitting: *Deleted

## 2015-08-26 DIAGNOSIS — Z5181 Encounter for therapeutic drug level monitoring: Secondary | ICD-10-CM

## 2015-08-26 DIAGNOSIS — Z7901 Long term (current) use of anticoagulants: Secondary | ICD-10-CM

## 2015-08-26 DIAGNOSIS — I4891 Unspecified atrial fibrillation: Secondary | ICD-10-CM

## 2015-08-26 LAB — POCT INR: INR: 1.8

## 2015-09-16 ENCOUNTER — Ambulatory Visit (INDEPENDENT_AMBULATORY_CARE_PROVIDER_SITE_OTHER): Payer: Medicare HMO | Admitting: *Deleted

## 2015-09-16 DIAGNOSIS — Z5181 Encounter for therapeutic drug level monitoring: Secondary | ICD-10-CM

## 2015-09-16 DIAGNOSIS — I4891 Unspecified atrial fibrillation: Secondary | ICD-10-CM | POA: Diagnosis not present

## 2015-09-16 DIAGNOSIS — Z7901 Long term (current) use of anticoagulants: Secondary | ICD-10-CM

## 2015-09-16 LAB — POCT INR: INR: 2.9

## 2015-10-01 DIAGNOSIS — I10 Essential (primary) hypertension: Secondary | ICD-10-CM | POA: Diagnosis not present

## 2015-10-01 DIAGNOSIS — I482 Chronic atrial fibrillation: Secondary | ICD-10-CM | POA: Diagnosis not present

## 2015-10-01 DIAGNOSIS — I2510 Atherosclerotic heart disease of native coronary artery without angina pectoris: Secondary | ICD-10-CM | POA: Diagnosis not present

## 2015-10-01 DIAGNOSIS — E1165 Type 2 diabetes mellitus with hyperglycemia: Secondary | ICD-10-CM | POA: Diagnosis not present

## 2015-10-14 ENCOUNTER — Ambulatory Visit (INDEPENDENT_AMBULATORY_CARE_PROVIDER_SITE_OTHER): Payer: Medicare HMO | Admitting: *Deleted

## 2015-10-14 DIAGNOSIS — I4891 Unspecified atrial fibrillation: Secondary | ICD-10-CM | POA: Diagnosis not present

## 2015-10-14 DIAGNOSIS — Z7901 Long term (current) use of anticoagulants: Secondary | ICD-10-CM

## 2015-10-14 DIAGNOSIS — Z5181 Encounter for therapeutic drug level monitoring: Secondary | ICD-10-CM

## 2015-10-14 LAB — POCT INR: INR: 1.7

## 2015-11-04 ENCOUNTER — Ambulatory Visit (INDEPENDENT_AMBULATORY_CARE_PROVIDER_SITE_OTHER): Payer: Medicare HMO | Admitting: *Deleted

## 2015-11-04 DIAGNOSIS — Z7901 Long term (current) use of anticoagulants: Secondary | ICD-10-CM | POA: Diagnosis not present

## 2015-11-04 DIAGNOSIS — Z5181 Encounter for therapeutic drug level monitoring: Secondary | ICD-10-CM

## 2015-11-04 DIAGNOSIS — I4891 Unspecified atrial fibrillation: Secondary | ICD-10-CM | POA: Diagnosis not present

## 2015-11-04 LAB — POCT INR: INR: 3.1

## 2015-11-10 DIAGNOSIS — E1165 Type 2 diabetes mellitus with hyperglycemia: Secondary | ICD-10-CM | POA: Diagnosis not present

## 2015-11-10 DIAGNOSIS — M1000 Idiopathic gout, unspecified site: Secondary | ICD-10-CM | POA: Diagnosis not present

## 2015-11-10 DIAGNOSIS — I10 Essential (primary) hypertension: Secondary | ICD-10-CM | POA: Diagnosis not present

## 2015-11-10 DIAGNOSIS — M109 Gout, unspecified: Secondary | ICD-10-CM | POA: Diagnosis not present

## 2015-11-10 DIAGNOSIS — L600 Ingrowing nail: Secondary | ICD-10-CM | POA: Diagnosis not present

## 2015-11-10 DIAGNOSIS — N189 Chronic kidney disease, unspecified: Secondary | ICD-10-CM | POA: Diagnosis not present

## 2015-12-02 ENCOUNTER — Ambulatory Visit (INDEPENDENT_AMBULATORY_CARE_PROVIDER_SITE_OTHER): Payer: Medicare HMO | Admitting: *Deleted

## 2015-12-02 DIAGNOSIS — Z7901 Long term (current) use of anticoagulants: Secondary | ICD-10-CM | POA: Diagnosis not present

## 2015-12-02 DIAGNOSIS — Z5181 Encounter for therapeutic drug level monitoring: Secondary | ICD-10-CM

## 2015-12-02 DIAGNOSIS — I4891 Unspecified atrial fibrillation: Secondary | ICD-10-CM | POA: Diagnosis not present

## 2015-12-02 LAB — POCT INR: INR: 2.4

## 2015-12-11 ENCOUNTER — Encounter: Payer: Self-pay | Admitting: Podiatry

## 2015-12-11 ENCOUNTER — Ambulatory Visit (INDEPENDENT_AMBULATORY_CARE_PROVIDER_SITE_OTHER): Payer: Medicare HMO | Admitting: Podiatry

## 2015-12-11 VITALS — BP 131/65 | HR 86 | Resp 16

## 2015-12-11 DIAGNOSIS — L03011 Cellulitis of right finger: Secondary | ICD-10-CM

## 2015-12-11 DIAGNOSIS — L600 Ingrowing nail: Secondary | ICD-10-CM | POA: Diagnosis not present

## 2015-12-11 DIAGNOSIS — L03031 Cellulitis of right toe: Secondary | ICD-10-CM | POA: Diagnosis not present

## 2015-12-11 NOTE — Progress Notes (Signed)
   Subjective:    Patient ID: Katrina Arnold, female    DOB: 12/12/1932, 82 y.o.   MRN: [id number]  HPI  Review of Systems  Cardiovascular: Positive for leg swelling.  Musculoskeletal: Positive for myalgias.  All other systems reviewed and are negative.      Objective:   Physical Exam        Assessment & Plan:

## 2015-12-11 NOTE — Patient Instructions (Signed)

## 2015-12-13 NOTE — Progress Notes (Signed)
Subjective:     Patient ID: Katrina Arnold, female   DOB: 1933-05-15, 82 y.o.   MRN: [id number]  HPI patient presents with caregiver with painful nail bed right hallux lateral side that's localized in nature with distal redness but no proximal edema erythema drainage noted. Patient states it's been present for about a month   Review of Systems  All other systems reviewed and are negative.      Objective:   Physical Exam  Constitutional: She is oriented to person, place, and time.  Musculoskeletal: Normal range of motion.  Neurological: She is oriented to person, place, and time.  Skin: Skin is warm and dry.  Nursing note and vitals reviewed.  vascular status was found to be diminished both DP and PT pulses with questioning not indicating at this time any indications of claudication upon questioning. The right hallux lateral borders incurvated and tender with distal redness and slight drainage noted. Patient is noted to have digital perfusion of 3 seconds and hair growth     Assessment:     Paronychia infection right hallux lateral border with ingrown toenail component    Plan:     H&P and education rendered. At this point I recommended removal of the lateral corner and explained soaks and risk of procedure and they want this done. I infiltrated 60 mg like Marcaine mixture remove the lateral corner exposed the nailbed and removed necrotic tissue and allowed channel for drainage. Reappoint to recheck

## 2015-12-21 ENCOUNTER — Other Ambulatory Visit: Payer: Self-pay

## 2015-12-21 MED ORDER — WARFARIN SODIUM 5 MG PO TABS: ORAL_TABLET | ORAL | Status: DC

## 2015-12-21 NOTE — Telephone Encounter (Signed)
rx ok per Rennie Natter RN to fill as requested

## 2016-01-01 ENCOUNTER — Telehealth: Payer: Self-pay | Admitting: *Deleted

## 2016-01-01 NOTE — Telephone Encounter (Signed)
Called patient at [phone] (Home #) to check to see how they were doing from their Paronychia procedure that was performed on Friday, Dec 11, 2015. Pt stated, "Feeling all right and has no pain".

## 2016-01-06 ENCOUNTER — Ambulatory Visit (INDEPENDENT_AMBULATORY_CARE_PROVIDER_SITE_OTHER): Payer: Medicare HMO | Admitting: *Deleted

## 2016-01-06 DIAGNOSIS — Z7901 Long term (current) use of anticoagulants: Secondary | ICD-10-CM | POA: Diagnosis not present

## 2016-01-06 DIAGNOSIS — I4891 Unspecified atrial fibrillation: Secondary | ICD-10-CM | POA: Diagnosis not present

## 2016-01-06 DIAGNOSIS — Z5181 Encounter for therapeutic drug level monitoring: Secondary | ICD-10-CM

## 2016-01-06 LAB — POCT INR: INR: 3.5

## 2016-01-07 DIAGNOSIS — I482 Chronic atrial fibrillation: Secondary | ICD-10-CM | POA: Diagnosis not present

## 2016-01-07 DIAGNOSIS — E784 Other hyperlipidemia: Secondary | ICD-10-CM | POA: Diagnosis not present

## 2016-01-07 DIAGNOSIS — N189 Chronic kidney disease, unspecified: Secondary | ICD-10-CM | POA: Diagnosis not present

## 2016-01-07 DIAGNOSIS — E1165 Type 2 diabetes mellitus with hyperglycemia: Secondary | ICD-10-CM | POA: Diagnosis not present

## 2016-01-27 ENCOUNTER — Ambulatory Visit (INDEPENDENT_AMBULATORY_CARE_PROVIDER_SITE_OTHER): Payer: Medicare HMO | Admitting: Pharmacist

## 2016-01-27 DIAGNOSIS — I4891 Unspecified atrial fibrillation: Secondary | ICD-10-CM

## 2016-01-27 DIAGNOSIS — Z7901 Long term (current) use of anticoagulants: Secondary | ICD-10-CM | POA: Diagnosis not present

## 2016-01-27 DIAGNOSIS — Z5181 Encounter for therapeutic drug level monitoring: Secondary | ICD-10-CM

## 2016-01-27 LAB — POCT INR: INR: 1.9

## 2016-02-17 ENCOUNTER — Ambulatory Visit (INDEPENDENT_AMBULATORY_CARE_PROVIDER_SITE_OTHER): Payer: Medicare HMO | Admitting: *Deleted

## 2016-02-17 DIAGNOSIS — Z5181 Encounter for therapeutic drug level monitoring: Secondary | ICD-10-CM | POA: Diagnosis not present

## 2016-02-17 DIAGNOSIS — Z7901 Long term (current) use of anticoagulants: Secondary | ICD-10-CM | POA: Diagnosis not present

## 2016-02-17 DIAGNOSIS — I4891 Unspecified atrial fibrillation: Secondary | ICD-10-CM | POA: Diagnosis not present

## 2016-02-17 LAB — POCT INR: INR: 2.9

## 2016-03-16 ENCOUNTER — Ambulatory Visit (INDEPENDENT_AMBULATORY_CARE_PROVIDER_SITE_OTHER): Payer: Medicare HMO | Admitting: *Deleted

## 2016-03-16 DIAGNOSIS — Z5181 Encounter for therapeutic drug level monitoring: Secondary | ICD-10-CM

## 2016-03-16 DIAGNOSIS — I4891 Unspecified atrial fibrillation: Secondary | ICD-10-CM

## 2016-03-16 DIAGNOSIS — Z7901 Long term (current) use of anticoagulants: Secondary | ICD-10-CM

## 2016-03-16 LAB — POCT INR: INR: 3.9

## 2016-04-04 ENCOUNTER — Other Ambulatory Visit (HOSPITAL_COMMUNITY): Payer: Self-pay | Admitting: *Deleted

## 2016-04-04 DIAGNOSIS — C189 Malignant neoplasm of colon, unspecified: Secondary | ICD-10-CM

## 2016-04-06 ENCOUNTER — Encounter (HOSPITAL_COMMUNITY): Payer: Medicare HMO | Attending: Hematology & Oncology | Admitting: Hematology & Oncology

## 2016-04-06 ENCOUNTER — Ambulatory Visit (INDEPENDENT_AMBULATORY_CARE_PROVIDER_SITE_OTHER): Payer: Medicare HMO | Admitting: *Deleted

## 2016-04-06 ENCOUNTER — Encounter (HOSPITAL_COMMUNITY): Payer: Medicare HMO

## 2016-04-06 ENCOUNTER — Encounter (HOSPITAL_COMMUNITY): Payer: Self-pay | Admitting: Hematology & Oncology

## 2016-04-06 ENCOUNTER — Encounter: Payer: Self-pay | Admitting: *Deleted

## 2016-04-06 VITALS — BP 113/57 | HR 87 | Temp 97.8°F | Resp 18 | Wt 158.4 lb

## 2016-04-06 DIAGNOSIS — Z85038 Personal history of other malignant neoplasm of large intestine: Secondary | ICD-10-CM

## 2016-04-06 DIAGNOSIS — Z5181 Encounter for therapeutic drug level monitoring: Secondary | ICD-10-CM | POA: Diagnosis not present

## 2016-04-06 DIAGNOSIS — C189 Malignant neoplasm of colon, unspecified: Secondary | ICD-10-CM | POA: Insufficient documentation

## 2016-04-06 DIAGNOSIS — Z7901 Long term (current) use of anticoagulants: Secondary | ICD-10-CM | POA: Diagnosis not present

## 2016-04-06 DIAGNOSIS — I4891 Unspecified atrial fibrillation: Secondary | ICD-10-CM

## 2016-04-06 DIAGNOSIS — R970 Elevated carcinoembryonic antigen [CEA]: Secondary | ICD-10-CM | POA: Diagnosis not present

## 2016-04-06 LAB — COMPREHENSIVE METABOLIC PANEL
ALT: 12 U/L — AB (ref 14–54)
AST: 23 U/L (ref 15–41)
Albumin: 4.3 g/dL (ref 3.5–5.0)
Alkaline Phosphatase: 133 U/L — ABNORMAL HIGH (ref 38–126)
Anion gap: 3 — ABNORMAL LOW (ref 5–15)
BILIRUBIN TOTAL: 0.3 mg/dL (ref 0.3–1.2)
BUN: 13 mg/dL (ref 6–20)
CHLORIDE: 106 mmol/L (ref 101–111)
CO2: 24 mmol/L (ref 22–32)
CREATININE: 1.1 mg/dL — AB (ref 0.44–1.00)
Calcium: 8.5 mg/dL — ABNORMAL LOW (ref 8.9–10.3)
GFR calc Af Amer: 53 mL/min — ABNORMAL LOW (ref >60)
GFR, EST NON AFRICAN AMERICAN: 46 mL/min — AB (ref >60)
GLUCOSE: 95 mg/dL (ref 65–99)
Potassium: 4 mmol/L (ref 3.5–5.1)
Sodium: 133 mmol/L — ABNORMAL LOW (ref 135–145)
Total Protein: 7.9 g/dL (ref 6.5–8.1)

## 2016-04-06 LAB — CBC WITH DIFFERENTIAL/PLATELET
BASOS ABS: 0 10*3/uL (ref 0.0–0.1)
Basophils Relative: 0 %
EOS PCT: 2 %
Eosinophils Absolute: 0.1 10*3/uL (ref 0.0–0.7)
HCT: 38.9 % (ref 36.0–46.0)
Hemoglobin: 12.7 g/dL (ref 12.0–15.0)
LYMPHS ABS: 2.7 10*3/uL (ref 0.7–4.0)
Lymphocytes Relative: 32 %
MCH: 28 pg (ref 26.0–34.0)
MCHC: 32.6 g/dL (ref 30.0–36.0)
MCV: 85.7 fL (ref 78.0–100.0)
MONO ABS: 0.7 10*3/uL (ref 0.1–1.0)
Monocytes Relative: 9 %
NEUTROS ABS: 4.7 10*3/uL (ref 1.7–7.7)
Neutrophils Relative %: 57 %
PLATELETS: 244 10*3/uL (ref 150–400)
RBC: 4.54 MIL/uL (ref 3.87–5.11)
RDW: 13.9 % (ref 11.5–15.5)
WBC: 8.2 10*3/uL (ref 4.0–10.5)

## 2016-04-06 LAB — POCT INR: INR: 2.7

## 2016-04-06 NOTE — Progress Notes (Signed)
[facility], MD [address] Katrina Arnold   CURRENT THERAPY: Observation  INTERVAL HISTORY: Katrina Arnold 82 y.o. female returns for  regular  visit for followup of stage II A. adenocarcinoma of colon status post right hemicolectomy in June 2009 by Dr. Geroge Baseman. She did not require chemotherapy. She is S/P colonoscopy in November 2013.   She denies any complaints and oncologic ROS questioning is negative.   Katrina Arnold is unaccompanied. I personally reviewed and went over laboratory studies with the patient.  The patient is agreeable to being discharged from oncology follow up. "That sounds like good news to me". She will continue to follow with GI, and her PCP.  She denies bowel issues. She eats well.   She states sometimes she has a little trouble with her feet, which is associated with being diabetic. She denies falling. She ambulates without a cane or walker. She stays active by walking to the mailbox and doing her own housework.  She no longer has screening mammograms.   Past Medical History:  Diagnosis Date  . Atrial fibrillation, chronic (HCC)    normal EF  . Cerebrovascular disease    Left cerebral CVA 2007; residual right sided weakness; 7/09 mild plaque; no focal stenosis  . Chronic anticoagulation   . Chronic kidney disease, stage 3, mod decreased GFR    Creatinine of 1.5 in 10/2008; h/o hyper- and hypokalemia  . Colon adenocarcinoma ([place]) 2009   s/p right hemicolectomy  . Diabetes mellitus, type 2 ([place])   . Dupuytren's contracture   . Hyperlipidemia   . Hypertension     has Hx of Adenocarcinoma of colon; DIABETES MELLITUS, TYPE II; HYPERLIPIDEMIA; ANEMIA; Hypertension; Atrial fibrillation ([place]); CEREBROVASCULAR DISEASE; CHRONIC KIDNEY DISEASE STAGE II (MILD); Chronic anticoagulation; and Encounter for therapeutic drug monitoring on her problem list.     has No Known Allergies.  Katrina Arnold had no medications administered during this  visit.  Past Surgical History:  Procedure Laterality Date  . APPENDECTOMY    . COLONOSCOPY  6/23/200   Rourk: Left diverticula, multiple polyps, adenocarcinoma of colon 8 cm distal to ICV  . COLONOSCOPY  07/02/2012   Procedure: COLONOSCOPY;  Surgeon: Daneil Dolin, MD;  Location: [facility];  Service: Endoscopy;  Laterality: N/A;  9:30  . COLONOSCOPY W/ POLYPECTOMY  05/2009   Rourk: internal hemorrhoidal tag, site of piecemeal polypectomy in  August identified, residual polypectomy.  Multiple tubular adenomas/tubulovillous  . HEMICOLECTOMY  12/2007   Right, adenocarcinoma  . TUBAL LIGATION      Denies any headaches, dizziness, double vision, fevers, chills, night sweats, nausea, vomiting, diarrhea, constipation, chest pain, heart palpitations, shortness of breath, blood in stool, black tarry stool, urinary pain, urinary burning, urinary frequency, hematuria. 14 point review of systems was performed and is negative except as detailed under history of present illness and above   PHYSICAL EXAMINATION  ECOG PERFORMANCE STATUS: 1 - Symptomatic but completely ambulatory  Vitals:   04/06/16 1320  BP: (!) 113/57  Pulse: 87  Resp: 18  Temp: 97.8 F (36.6 C)    GENERAL:alert, no distress, well nourished, well developed, comfortable, cooperative and smiling. Appears younger than stated age. SKIN: skin color, texture, turgor are normal, no rashes or significant lesions HEAD: Normocephalic, No masses, lesions, tenderness or abnormalities EYES: normal, PERRLA, EOMI, Conjunctiva are pink and non-injected EARS: External ears normal. TM's visualized bilaterally and clear OROPHARYNX:mucous membranes are moist  NECK: supple, no adenopathy, thyroid normal size,  non-tender, without nodularity, no stridor, non-tender, trachea midline LYMPH:  no palpable lymphadenopathy, no hepatosplenomegaly BREAST:not examined LUNGS: clear to auscultation and percussion HEART: regular rate & rhythm, no  murmurs, no gallops, S1 normal and S2 normal ABDOMEN:abdomen soft, non-tender, obese and normal bowel sounds BACK: Back symmetric, no curvature. EXTREMITIES:less then 2 second capillary refill, no joint deformities, effusion, or inflammation, no edema, no skin discoloration, no clubbing, no cyanosis  NEURO: alert & oriented x 3 with fluent speech, no focal motor/sensory deficits, gait normal   LABORATORY DATA: Results for Katrina Arnold, Katrina Arnold (MRN [id number])   Ref. Range 04/06/2016 11:58  Sodium Latest Ref Range: 135 - 145 mmol/L 133 (L)  Potassium Latest Ref Range: 3.5 - 5.1 mmol/L 4.0  Chloride Latest Ref Range: 101 - 111 mmol/L 106  CO2 Latest Ref Range: 22 - 32 mmol/L 24  BUN Latest Ref Range: 6 - 20 mg/dL 13  Creatinine Latest Ref Range: 0.44 - 1.00 mg/dL 1.10 (H)  Calcium Latest Ref Range: 8.9 - 10.3 mg/dL 8.5 (L)  EGFR (Non-African Amer.) Latest Ref Range: >60 mL/min 46 (L)  EGFR (African American) Latest Ref Range: >60 mL/min 53 (L)  Glucose Latest Ref Range: 65 - 99 mg/dL 95  Anion gap Latest Ref Range: 5 - 15  3 (L)  Alkaline Phosphatase Latest Ref Range: 38 - 126 U/L 133 (H)  Albumin Latest Ref Range: 3.5 - 5.0 g/dL 4.3  AST Latest Ref Range: 15 - 41 U/L 23  ALT Latest Ref Range: 14 - 54 U/L 12 (L)  Total Protein Latest Ref Range: 6.5 - 8.1 g/dL 7.9  Total Bilirubin Latest Ref Range: 0.3 - 1.2 mg/dL 0.3  WBC Latest Ref Range: 4.0 - 10.5 K/uL 8.2  RBC Latest Ref Range: 3.87 - 5.11 MIL/uL 4.54  Hemoglobin Latest Ref Range: 12.0 - 15.0 g/dL 12.7  HCT Latest Ref Range: 36.0 - 46.0 % 38.9  MCV Latest Ref Range: 78.0 - 100.0 fL 85.7  MCH Latest Ref Range: 26.0 - 34.0 pg 28.0  MCHC Latest Ref Range: 30.0 - 36.0 g/dL 32.6  RDW Latest Ref Range: 11.5 - 15.5 % 13.9  Platelets Latest Ref Range: 150 - 400 K/uL 244  Neutrophils Latest Units: % 57  Lymphocytes Latest Units: % 32  Monocytes Relative Latest Units: % 9  Eosinophil Latest Units: % 2  Basophil Latest Units: % 0  NEUT# Latest  Ref Range: 1.7 - 7.7 K/uL 4.7  Lymphocyte # Latest Ref Range: 0.7 - 4.0 K/uL 2.7  Monocyte # Latest Ref Range: 0.1 - 1.0 K/uL 0.7  Eosinophils Absolute Latest Ref Range: 0.0 - 0.7 K/uL 0.1  Basophils Absolute Latest Ref Range: 0.0 - 0.1 K/uL 0.0    Results for Katrina Arnold, Katrina Arnold (MRN [id number])   Ref. Range 06/27/2012 10:44 09/19/2012 10:06 12/12/2012 10:16 03/06/2013 10:59 04/04/2014 11:34 04/03/2015 09:51 05/22/2015 12:28  CEA Latest Ref Range: 0.0 - 4.7 ng/mL 3.8 3.4 3.8 2.8 3.1 6.7 (H) 6.5 (H)   ASSESSMENT:  1. Stage II A. adenocarcinoma of colon status post right hemicolectomy in June 2009 by Dr. Geroge Baseman not require chemotherapy. She is S/P colonoscopy in November 2013.   Patient Active Problem List   Diagnosis Date Noted  . Encounter for therapeutic drug monitoring 09/25/2013  . Chronic anticoagulation 10/13/2010  . CEREBROVASCULAR DISEASE 11/17/2009  . Hx of Adenocarcinoma of colon 01/05/2009  . DIABETES MELLITUS, TYPE II 01/05/2009  . HYPERLIPIDEMIA 01/05/2009  . ANEMIA 01/05/2009  . Hypertension 01/05/2009  . Atrial fibrillation (  [place]) 01/05/2009  . CHRONIC KIDNEY DISEASE STAGE II (MILD) 01/05/2009     PLAN:  She is 8 years out from diagnosis. CEA has been slightly elevated over the past year. This is of uncertain significance. There is no record of preoperative CEA ever being elevated. Last C-scope was in 2013. Last CT A/P was in 2009.  We discussed discharging her back to her PCP today. Weight is stable.   Will await CEA today and if elevated will consider repeat CT and referral back to GI. Will call patient with results.   The patient will be tentatively discharged.   All questions were answered. The patient knows to call the clinic with any problems, questions or concerns. We can certainly see the patient much sooner if necessary.  This document serves as a record of services personally performed by Ancil Linsey, MD. It was created on her behalf by Arlyce Harman, a  trained medical scribe. The creation of this record is based on the scribe's personal observations and the provider's statements to them. This document has been checked and approved by the attending provider.  I have reviewed the above documentation for accuracy and completeness, and I agree with the above.  Molli Hazard, MD

## 2016-04-06 NOTE — Patient Instructions (Addendum)
[place] at [facility] Discharge Instructions  RECOMMENDATIONS MADE BY THE CONSULTANT AND ANY TEST RESULTS WILL BE SENT TO YOUR REFERRING PHYSICIAN.  You saw Dr. Whitney Muse today. Return as needed.  Thank you for choosing [place] at [facility] to provide your oncology and hematology care.  To afford each patient quality time with our provider, please arrive at least 15 minutes before your scheduled appointment time.   Beginning January 23rd 2017 lab work for the [facility] will be done in the  Main lab at [facility] on 1st floor. If you have a lab appointment with the [place] please come in thru the  Main Entrance and check in at the main information desk  You need to re-schedule your appointment should you arrive 10 or more minutes late.  We strive to give you quality time with our providers, and arriving late affects you and other patients whose appointments are after yours.  Also, if you no show three or more times for appointments you may be dismissed from the clinic at the providers discretion.     Again, thank you for choosing [facility].  Our hope is that these requests will decrease the amount of time that you wait before being seen by our physicians.       _____________________________________________________________  Should you have questions after your visit to [facility], please contact our office at [phone] between the hours of 8:30 a.m. and 4:30 p.m.  Voicemails left after 4:30 p.m. will not be returned until the following business day.  For prescription refill requests, have your pharmacy contact our office.         Resources For Cancer Patients and their Caregivers ? American Cancer Society: Can assist with transportation, wigs, general needs, runs Look Good Feel Better.        641-279-5544 ? Cancer Care: Provides financial assistance, online support groups, medication/co-pay  assistance.  1-800-813-HOPE (650)825-7696) ? [place] Assists [place] cancer patients and their families through emotional , educational and financial support.  570-289-9156 ? [place] DSS Where to apply for food stamps, Medicaid and utility assistance. 539-315-7332 ? [employer]: Transportation to medical appointments. (657)303-6938 ? Social Security Administration: May apply for disability if have a Stage IV cancer. (878) 164-9402 (762)312-3452 ? [employer], Disability and Transit Services: Assists with nutrition, care and transit needs. [place] Support Programs: @10RELATIVEDAYS @ > Cancer Support Group  2nd Tuesday of the month 1pm-2pm, Journey Room  > Creative Journey  3rd Tuesday of the month 1130am-1pm, Journey Room  > Look Good Feel Better  1st Wednesday of the month 10am-12 noon, Journey Room (Call [place] to register [phone])

## 2016-04-07 LAB — CEA: CEA: 6.2 ng/mL — ABNORMAL HIGH (ref 0.0–4.7)

## 2016-04-08 ENCOUNTER — Telehealth (HOSPITAL_COMMUNITY): Payer: Self-pay | Admitting: *Deleted

## 2016-04-08 NOTE — Telephone Encounter (Signed)
LMOM that labs were stable.

## 2016-04-08 NOTE — Telephone Encounter (Signed)
-----   Message from [facility], PA-C sent at 04/07/2016  5:40 PM EDT ----- Stable

## 2016-04-11 DIAGNOSIS — I2510 Atherosclerotic heart disease of native coronary artery without angina pectoris: Secondary | ICD-10-CM | POA: Diagnosis not present

## 2016-04-11 DIAGNOSIS — G459 Transient cerebral ischemic attack, unspecified: Secondary | ICD-10-CM | POA: Diagnosis not present

## 2016-04-11 DIAGNOSIS — N189 Chronic kidney disease, unspecified: Secondary | ICD-10-CM | POA: Diagnosis not present

## 2016-04-11 DIAGNOSIS — D649 Anemia, unspecified: Secondary | ICD-10-CM | POA: Diagnosis not present

## 2016-04-11 DIAGNOSIS — E119 Type 2 diabetes mellitus without complications: Secondary | ICD-10-CM | POA: Diagnosis not present

## 2016-04-11 DIAGNOSIS — I482 Chronic atrial fibrillation: Secondary | ICD-10-CM | POA: Diagnosis not present

## 2016-04-11 DIAGNOSIS — I10 Essential (primary) hypertension: Secondary | ICD-10-CM | POA: Diagnosis not present

## 2016-04-11 DIAGNOSIS — Z23 Encounter for immunization: Secondary | ICD-10-CM | POA: Diagnosis not present

## 2016-04-11 DIAGNOSIS — E1165 Type 2 diabetes mellitus with hyperglycemia: Secondary | ICD-10-CM | POA: Diagnosis not present

## 2016-04-11 DIAGNOSIS — M109 Gout, unspecified: Secondary | ICD-10-CM | POA: Diagnosis not present

## 2016-04-11 DIAGNOSIS — Z0000 Encounter for general adult medical examination without abnormal findings: Secondary | ICD-10-CM | POA: Diagnosis not present

## 2016-04-15 ENCOUNTER — Encounter (HOSPITAL_COMMUNITY): Payer: Self-pay | Admitting: Hematology & Oncology

## 2016-04-19 DIAGNOSIS — R69 Illness, unspecified: Secondary | ICD-10-CM | POA: Diagnosis not present

## 2016-05-04 ENCOUNTER — Ambulatory Visit (INDEPENDENT_AMBULATORY_CARE_PROVIDER_SITE_OTHER): Payer: Medicare HMO | Admitting: *Deleted

## 2016-05-04 DIAGNOSIS — Z7901 Long term (current) use of anticoagulants: Secondary | ICD-10-CM | POA: Diagnosis not present

## 2016-05-04 DIAGNOSIS — Z5181 Encounter for therapeutic drug level monitoring: Secondary | ICD-10-CM

## 2016-05-04 DIAGNOSIS — I4891 Unspecified atrial fibrillation: Secondary | ICD-10-CM

## 2016-05-04 LAB — POCT INR: INR: 2.6

## 2016-06-01 ENCOUNTER — Ambulatory Visit (INDEPENDENT_AMBULATORY_CARE_PROVIDER_SITE_OTHER): Payer: Medicare HMO | Admitting: *Deleted

## 2016-06-01 DIAGNOSIS — I4891 Unspecified atrial fibrillation: Secondary | ICD-10-CM | POA: Diagnosis not present

## 2016-06-01 DIAGNOSIS — Z5181 Encounter for therapeutic drug level monitoring: Secondary | ICD-10-CM

## 2016-06-01 DIAGNOSIS — Z7901 Long term (current) use of anticoagulants: Secondary | ICD-10-CM

## 2016-06-01 LAB — POCT INR: INR: 2.4

## 2016-06-17 ENCOUNTER — Other Ambulatory Visit: Payer: Self-pay | Admitting: Cardiology

## 2016-07-07 DIAGNOSIS — I2510 Atherosclerotic heart disease of native coronary artery without angina pectoris: Secondary | ICD-10-CM | POA: Diagnosis not present

## 2016-07-07 DIAGNOSIS — I10 Essential (primary) hypertension: Secondary | ICD-10-CM | POA: Diagnosis not present

## 2016-07-07 DIAGNOSIS — I482 Chronic atrial fibrillation: Secondary | ICD-10-CM | POA: Diagnosis not present

## 2016-07-07 DIAGNOSIS — E1165 Type 2 diabetes mellitus with hyperglycemia: Secondary | ICD-10-CM | POA: Diagnosis not present

## 2016-07-13 ENCOUNTER — Ambulatory Visit (INDEPENDENT_AMBULATORY_CARE_PROVIDER_SITE_OTHER): Payer: Medicare HMO | Admitting: *Deleted

## 2016-07-13 DIAGNOSIS — Z5181 Encounter for therapeutic drug level monitoring: Secondary | ICD-10-CM | POA: Diagnosis not present

## 2016-07-13 DIAGNOSIS — I4891 Unspecified atrial fibrillation: Secondary | ICD-10-CM | POA: Diagnosis not present

## 2016-07-13 LAB — POCT INR: INR: 2.7

## 2016-07-16 ENCOUNTER — Other Ambulatory Visit: Payer: Self-pay | Admitting: Cardiology

## 2016-08-24 ENCOUNTER — Ambulatory Visit (INDEPENDENT_AMBULATORY_CARE_PROVIDER_SITE_OTHER): Payer: Medicare HMO | Admitting: *Deleted

## 2016-08-24 DIAGNOSIS — I4891 Unspecified atrial fibrillation: Secondary | ICD-10-CM | POA: Diagnosis not present

## 2016-08-24 DIAGNOSIS — Z5181 Encounter for therapeutic drug level monitoring: Secondary | ICD-10-CM

## 2016-08-24 LAB — POCT INR: INR: 2

## 2016-09-08 ENCOUNTER — Ambulatory Visit: Payer: Self-pay | Admitting: Cardiology

## 2016-10-12 ENCOUNTER — Ambulatory Visit (INDEPENDENT_AMBULATORY_CARE_PROVIDER_SITE_OTHER): Payer: Medicare HMO | Admitting: *Deleted

## 2016-10-12 DIAGNOSIS — I4891 Unspecified atrial fibrillation: Secondary | ICD-10-CM

## 2016-10-12 DIAGNOSIS — Z5181 Encounter for therapeutic drug level monitoring: Secondary | ICD-10-CM

## 2016-10-12 LAB — POCT INR: INR: 6

## 2016-10-13 DIAGNOSIS — I2510 Atherosclerotic heart disease of native coronary artery without angina pectoris: Secondary | ICD-10-CM | POA: Diagnosis not present

## 2016-10-13 DIAGNOSIS — I10 Essential (primary) hypertension: Secondary | ICD-10-CM | POA: Diagnosis not present

## 2016-10-13 DIAGNOSIS — E1165 Type 2 diabetes mellitus with hyperglycemia: Secondary | ICD-10-CM | POA: Diagnosis not present

## 2016-10-13 DIAGNOSIS — I482 Chronic atrial fibrillation: Secondary | ICD-10-CM | POA: Diagnosis not present

## 2016-10-19 ENCOUNTER — Ambulatory Visit (INDEPENDENT_AMBULATORY_CARE_PROVIDER_SITE_OTHER): Payer: Medicare HMO | Admitting: *Deleted

## 2016-10-19 DIAGNOSIS — Z5181 Encounter for therapeutic drug level monitoring: Secondary | ICD-10-CM | POA: Diagnosis not present

## 2016-10-19 DIAGNOSIS — I4891 Unspecified atrial fibrillation: Secondary | ICD-10-CM | POA: Diagnosis not present

## 2016-10-19 LAB — POCT INR: INR: 2.5

## 2016-11-09 ENCOUNTER — Ambulatory Visit (INDEPENDENT_AMBULATORY_CARE_PROVIDER_SITE_OTHER): Payer: Medicare HMO | Admitting: *Deleted

## 2016-11-09 DIAGNOSIS — Z5181 Encounter for therapeutic drug level monitoring: Secondary | ICD-10-CM

## 2016-11-09 DIAGNOSIS — I4891 Unspecified atrial fibrillation: Secondary | ICD-10-CM | POA: Diagnosis not present

## 2016-11-09 LAB — POCT INR: INR: 3.3

## 2016-11-30 ENCOUNTER — Ambulatory Visit (INDEPENDENT_AMBULATORY_CARE_PROVIDER_SITE_OTHER): Payer: Medicare HMO | Admitting: *Deleted

## 2016-11-30 DIAGNOSIS — Z5181 Encounter for therapeutic drug level monitoring: Secondary | ICD-10-CM

## 2016-11-30 DIAGNOSIS — I4891 Unspecified atrial fibrillation: Secondary | ICD-10-CM

## 2016-11-30 LAB — POCT INR: INR: 1.9

## 2016-12-21 ENCOUNTER — Ambulatory Visit (INDEPENDENT_AMBULATORY_CARE_PROVIDER_SITE_OTHER): Payer: Medicare HMO | Admitting: *Deleted

## 2016-12-21 DIAGNOSIS — I4891 Unspecified atrial fibrillation: Secondary | ICD-10-CM

## 2016-12-21 DIAGNOSIS — Z5181 Encounter for therapeutic drug level monitoring: Secondary | ICD-10-CM | POA: Diagnosis not present

## 2016-12-21 LAB — POCT INR: INR: 3.4

## 2017-01-05 DIAGNOSIS — E1165 Type 2 diabetes mellitus with hyperglycemia: Secondary | ICD-10-CM | POA: Diagnosis not present

## 2017-01-05 DIAGNOSIS — I10 Essential (primary) hypertension: Secondary | ICD-10-CM | POA: Diagnosis not present

## 2017-01-05 DIAGNOSIS — I2510 Atherosclerotic heart disease of native coronary artery without angina pectoris: Secondary | ICD-10-CM | POA: Diagnosis not present

## 2017-01-05 DIAGNOSIS — I482 Chronic atrial fibrillation: Secondary | ICD-10-CM | POA: Diagnosis not present

## 2017-01-11 ENCOUNTER — Ambulatory Visit (INDEPENDENT_AMBULATORY_CARE_PROVIDER_SITE_OTHER): Payer: Medicare HMO | Admitting: *Deleted

## 2017-01-11 DIAGNOSIS — Z5181 Encounter for therapeutic drug level monitoring: Secondary | ICD-10-CM | POA: Diagnosis not present

## 2017-01-11 DIAGNOSIS — I4891 Unspecified atrial fibrillation: Secondary | ICD-10-CM

## 2017-01-11 LAB — POCT INR: INR: 2.6

## 2017-01-17 ENCOUNTER — Ambulatory Visit (INDEPENDENT_AMBULATORY_CARE_PROVIDER_SITE_OTHER): Payer: Medicare HMO

## 2017-01-17 ENCOUNTER — Telehealth: Payer: Self-pay | Admitting: Podiatry

## 2017-01-17 ENCOUNTER — Ambulatory Visit (INDEPENDENT_AMBULATORY_CARE_PROVIDER_SITE_OTHER): Payer: Medicare HMO | Admitting: Podiatry

## 2017-01-17 ENCOUNTER — Encounter: Payer: Self-pay | Admitting: Podiatry

## 2017-01-17 VITALS — BP 131/78 | HR 90 | Resp 18

## 2017-01-17 DIAGNOSIS — M1000 Idiopathic gout, unspecified site: Secondary | ICD-10-CM

## 2017-01-17 DIAGNOSIS — L02619 Cutaneous abscess of unspecified foot: Secondary | ICD-10-CM | POA: Diagnosis not present

## 2017-01-17 DIAGNOSIS — R52 Pain, unspecified: Secondary | ICD-10-CM

## 2017-01-17 DIAGNOSIS — L03119 Cellulitis of unspecified part of limb: Secondary | ICD-10-CM | POA: Diagnosis not present

## 2017-01-17 LAB — CBC WITH DIFFERENTIAL/PLATELET
BASOS ABS: 73 {cells}/uL (ref 0–200)
BASOS PCT: 1 %
EOS ABS: 146 {cells}/uL (ref 15–500)
Eosinophils Relative: 2 %
HEMATOCRIT: 38 % (ref 35.0–45.0)
HEMOGLOBIN: 12.4 g/dL (ref 11.7–15.5)
Lymphocytes Relative: 33 %
Lymphs Abs: 2409 cells/uL (ref 850–3900)
MCH: 27.2 pg (ref 27.0–33.0)
MCHC: 32.6 g/dL (ref 32.0–36.0)
MCV: 83.3 fL (ref 80.0–100.0)
MONO ABS: 730 {cells}/uL (ref 200–950)
MONOS PCT: 10 %
MPV: 9.4 fL (ref 7.5–12.5)
NEUTROS ABS: 3942 {cells}/uL (ref 1500–7800)
Neutrophils Relative %: 54 %
PLATELETS: 297 10*3/uL (ref 140–400)
RBC: 4.56 MIL/uL (ref 3.80–5.10)
RDW: 14.5 % (ref 11.0–15.0)
WBC: 7.3 10*3/uL (ref 3.8–10.8)

## 2017-01-17 LAB — URIC ACID: Uric Acid, Serum: 8.6 mg/dL — ABNORMAL HIGH (ref 2.5–7.0)

## 2017-01-17 MED ORDER — COLCHICINE 0.6 MG PO TABS: 0.6000 mg | ORAL_TABLET | Freq: Every day | ORAL | 0 refills | Status: DC

## 2017-01-17 MED ORDER — CEPHALEXIN 500 MG PO CAPS: 500.0000 mg | ORAL_CAPSULE | Freq: Two times a day (BID) | ORAL | 0 refills | Status: DC

## 2017-01-17 NOTE — Patient Instructions (Signed)
Go the lab today and have your lab test done Begin taking antibiotics cephalexin by mouth one twice a day 7 days Begin taking colchicine 0.6 mg once a day If you notice any sudden increased pain, swelling, redness, fever presents emergency department  Diabetes and Foot Care Diabetes may cause you to have problems because of poor blood supply (circulation) to your feet and legs. This may cause the skin on your feet to become thinner, break easier, and heal more slowly. Your skin may become dry, and the skin may peel and crack. You may also have nerve damage in your legs and feet causing decreased feeling in them. You may not notice minor injuries to your feet that could lead to infections or more serious problems. Taking care of your feet is one of the most important things you can do for yourself. Follow these instructions at home:  Wear shoes at all times, even in the house. Do not go barefoot. Bare feet are easily injured.  Check your feet daily for blisters, cuts, and redness. If you cannot see the bottom of your feet, use a mirror or ask someone for help.  Wash your feet with warm water (do not use hot water) and mild soap. Then pat your feet and the areas between your toes until they are completely dry. Do not soak your feet as this can dry your skin.  Apply a moisturizing lotion or petroleum jelly (that does not contain alcohol and is unscented) to the skin on your feet and to dry, brittle toenails. Do not apply lotion between your toes.  Trim your toenails straight across. Do not dig under them or around the cuticle. File the edges of your nails with an emery board or nail file.  Do not cut corns or calluses or try to remove them with medicine.  Wear clean socks or stockings every day. Make sure they are not too tight. Do not wear knee-high stockings since they may decrease blood flow to your legs.  Wear shoes that fit properly and have enough cushioning. To break in new shoes, wear  them for just a few hours a day. This prevents you from injuring your feet. Always look in your shoes before you put them on to be sure there are no objects inside.  Do not cross your legs. This may decrease the blood flow to your feet.  If you find a minor scrape, cut, or break in the skin on your feet, keep it and the skin around it clean and dry. These areas may be cleansed with mild soap and water. Do not cleanse the area with peroxide, alcohol, or iodine.  When you remove an adhesive bandage, be sure not to damage the skin around it.  If you have a wound, look at it several times a day to make sure it is healing.  Do not use heating pads or hot water bottles. They may burn your skin. If you have lost feeling in your feet or legs, you may not know it is happening until it is too late.  Make sure your health care provider performs a complete foot exam at least annually or more often if you have foot problems. Report any cuts, sores, or bruises to your health care provider immediately. Contact a health care provider if:  You have an injury that is not healing.  You have cuts or breaks in the skin.  You have an ingrown nail.  You notice redness on your legs or feet.  You feel burning or tingling in your legs or feet.  You have pain or cramps in your legs and feet.  Your legs or feet are numb.  Your feet always feel cold. Get help right away if:  There is increasing redness, swelling, or pain in or around a wound.  There is a red line that goes up your leg.  Pus is coming from a wound.  You develop a fever or as directed by your health care provider.  You notice a bad smell coming from an ulcer or wound. This information is not intended to replace advice given to you by your health care provider. Make sure you discuss any questions you have with your health care provider. Document Released: 07/01/2000 Document Revised: 12/10/2015 Document Reviewed: 12/11/2012 Elsevier  Interactive Patient Education  2017 [employer].

## 2017-01-17 NOTE — Telephone Encounter (Signed)
I have already done this. Katrina Arnold

## 2017-01-17 NOTE — Telephone Encounter (Signed)
Yes, this is Katrina Arnold's son calling. We were there this morning and an Rx was sent to her pharmacy which is [facility] [place]. We are wanting that Rx sent to [facility] in [place] instead. Their phone number is [phone]. Thank you.

## 2017-01-17 NOTE — Progress Notes (Signed)
   Subjective:    Patient ID: Katrina Arnold, female    DOB: 11/27/1932, 83 y.o.   MRN: [id number]  HPI This patient presents today with her son present to treatment room she is complaining of acute onset of pain and swelling in the right great toe joint. She says that the pain and swelling have reduced some since the onset. Patient has soaked and avoids tightfitting shoes. Patient is diabetic without history of claudication or amputation Patient has some history of burning on off weightbearing and feet Patient uses tobacco describing dipping   Review of Systems  Musculoskeletal:       My right foot is swelling and red on top   All other systems reviewed and are negative.      Objective:   Physical Exam  Orientated 3 DP and PT pulses nonpalpable bilaterally Capillary reflex immediate bilaterally Local erythema edema right first MPJ No calf pain or calf tenderness bilaterally  Neurological: Sensation to 10 g monofilament wire intact 6/9 right and 8/9 left Vibratory sensation reactive bilaterally Ankle reflex reactive bilaterally  Dermatological: No open skin lesions bilaterally Low-grade erythema and edema over first MPJ right Atrophic skin with absent hair growth bilaterally  Musculoskeletal: Exquisite palpable tenderness to palpation medial first MPJ right and range of motion right  X-ray examination weightbearing right foot dated 01/18/2017  Intact bony structure without any fracture and/or dislocation Increased soft tissue density noted in around the first MPJ right Cystic formation medial first MPJ right  Radiographic impression: Increased soft tissue density medial first MPJ right correlate with clinical findings Cystic formation medial first MPJ right  Lab results dated 01/17/2017 CBC within normal limits Sedimentation rate 30 mm high and a normal Uric acid elevated 8.6      Assessment & Plan:   Assessment: Diabetic with peripheral arterial  disease Gouty arthritis versus cellulitis right first MPJ  Plan: Patient referred to the lab for CBC and sedimentation rate Rx cephalexin 500 mg by mouth twice a day 7 days  Reviewed results today on 01/18/2017 demonstrated probable gout attack based on elevated uric acid of 8.6 Colchicine adequate for treatment at this time Allow patient to take cephalexin twice a day for prophylaxis Rx colchicine 0.6 mg 1 by mouth daily Instructed patient if she notice any sudden increase in pain, swelling, redness, fever to present to the emergency department  Reappoint 7 days

## 2017-01-18 LAB — SEDIMENTATION RATE: Sed Rate: 30 mm/hr (ref 0–30)

## 2017-01-19 ENCOUNTER — Telehealth: Payer: Self-pay | Admitting: Podiatry

## 2017-01-19 NOTE — Telephone Encounter (Signed)
Pt's son was calling about Rx from Tuesday. I told him it had been sent to [facility] in [place] per their request and that it was received by the pharmacy on Tuesday 03 July at 4:22 pm. Pt's son stated he would call them to see if it is ready.

## 2017-01-19 NOTE — Telephone Encounter (Signed)
Dr Amalia Hailey, patient states that colchicine is too expensive for her, is there an alternative medication that we can prescribe?

## 2017-01-19 NOTE — Telephone Encounter (Signed)
Pt's son Winferd Humphrey called back in regards to Rx Colchicine. Stated it is too much and wants to know if something cheaper can be called in. Pt decided not to get this Rx from [facility] in [place]. Winferd Humphrey also wanted to know if it would be okay for pt to just take the other Rx until her appointment on Wednesday? Requested a call back at [phone].

## 2017-01-24 ENCOUNTER — Ambulatory Visit: Payer: Medicare HMO | Admitting: Podiatry

## 2017-01-25 ENCOUNTER — Encounter: Payer: Self-pay | Admitting: Podiatry

## 2017-01-25 ENCOUNTER — Telehealth: Payer: Self-pay | Admitting: *Deleted

## 2017-01-25 ENCOUNTER — Ambulatory Visit (INDEPENDENT_AMBULATORY_CARE_PROVIDER_SITE_OTHER): Payer: Medicare HMO | Admitting: Podiatry

## 2017-01-25 VITALS — BP 133/67 | HR 82 | Temp 96.4°F | Resp 18

## 2017-01-25 DIAGNOSIS — M1000 Idiopathic gout, unspecified site: Secondary | ICD-10-CM

## 2017-01-25 MED ORDER — PREDNISONE 10 MG (21) PO TBPK: ORAL_TABLET | ORAL | 0 refills | Status: DC

## 2017-01-25 NOTE — Progress Notes (Signed)
   Subjective:    Patient ID: Katrina Arnold, female    DOB: September 04, 1932, 83 y.o.   MRN: [id number]  HPI This patient presents again complaining of ongoing pain in the right great toe joint. She says the pain has diminished somewhat since the visit of 01/17/2017. Patient did not fill colchicine prescription from the encounter 01/17/2017 because she said she could not afford the medication. She said she completed the antibiotic without any complaint. The patient's son is present in the treatment room Patient is a known diabetic without history amputation or claudication   Review of Systems  Skin:       Right foot no better  All other systems reviewed and are negative.      Objective:   Physical Exam  BP 133/67 Pulse 82 Respiration 18 Temperature 96.4 Fahrenheit Orientated 3 DP and PT pulses nonpalpable bilaterally Capillary reflex immediate bilaterally Local erythema edema right first MPJ No calf pain or calf tenderness bilaterally  Neurological: Sensation to 10 g monofilament wire intact 6/9 right and 8/9 left Vibratory sensation reactive bilaterally Ankle reflex reactive bilaterally  Dermatological: No open skin lesions bilaterally Low-grade erythema and edema over first MPJ right Atrophic skin with absent hair growth bilaterally  Musculoskeletal: Exquisite palpable tenderness to palpation medial first MPJ right and range of motion right  X-ray examination weightbearing right foot dated 01/18/2017  Intact bony structure without any fracture and/or dislocation Increased soft tissue density noted in around the first MPJ right Cystic formation medial first MPJ right  Radiographic impression: Increased soft tissue density medial first MPJ right correlate with clinical findings Cystic formation medial first MPJ right  Lab results dated 01/17/2017 CBC within normal limits Sedimentation rate 30 mm high and a normal Uric acid elevated 8.6      Assessment &  Plan:   Assessment: Diabetic with peripheral arterial disease Gouty arthritis right first MPJ  Plan: I reviewed the results with patient patient's son and informed and the patient was having a gout flare I discussed gout in general and has this is patient's first episode will treated symptomatically Rx Sterapred 10 mg six-day tapering Dosepak Patient instructed to return if symptoms do not improve after completion of Dosepak Instructed patient to make her primary care physician aware that she had a gout flare  Reappoint at patient's request

## 2017-01-25 NOTE — Telephone Encounter (Signed)
Patient called and stated that the RX did not go to the right pharmacy so I changed the pharmacy and re sent the RX. Katrina Arnold

## 2017-01-25 NOTE — Patient Instructions (Signed)
Your lab results confirmed that the swelling and redness in the right big toe joint was from gout. Complete 6 days of the 10 mg prednisone. If you have recurrent attacks please contact your primary care physician

## 2017-01-25 NOTE — Addendum Note (Signed)
Addended by: Cranford Mon R on: 01/25/2017 04:56 PM   Modules accepted: Orders

## 2017-02-08 ENCOUNTER — Ambulatory Visit (INDEPENDENT_AMBULATORY_CARE_PROVIDER_SITE_OTHER): Payer: Medicare HMO | Admitting: *Deleted

## 2017-02-08 DIAGNOSIS — Z5181 Encounter for therapeutic drug level monitoring: Secondary | ICD-10-CM | POA: Diagnosis not present

## 2017-02-08 DIAGNOSIS — I4891 Unspecified atrial fibrillation: Secondary | ICD-10-CM

## 2017-02-08 LAB — POCT INR: INR: 4.8

## 2017-02-22 ENCOUNTER — Ambulatory Visit (INDEPENDENT_AMBULATORY_CARE_PROVIDER_SITE_OTHER): Payer: Medicare HMO | Admitting: *Deleted

## 2017-02-22 DIAGNOSIS — Z5181 Encounter for therapeutic drug level monitoring: Secondary | ICD-10-CM | POA: Diagnosis not present

## 2017-02-22 DIAGNOSIS — I4891 Unspecified atrial fibrillation: Secondary | ICD-10-CM | POA: Diagnosis not present

## 2017-02-22 LAB — POCT INR: INR: 3

## 2017-03-13 ENCOUNTER — Telehealth: Payer: Self-pay | Admitting: *Deleted

## 2017-03-13 NOTE — Telephone Encounter (Signed)
Needs Warfarin sent to [facility] [place] / tg

## 2017-03-14 MED ORDER — WARFARIN SODIUM 5 MG PO TABS: ORAL_TABLET | ORAL | 4 refills | Status: DC

## 2017-03-14 NOTE — Telephone Encounter (Signed)
Done

## 2017-03-15 ENCOUNTER — Ambulatory Visit (INDEPENDENT_AMBULATORY_CARE_PROVIDER_SITE_OTHER): Payer: Medicare HMO | Admitting: *Deleted

## 2017-03-15 DIAGNOSIS — I4891 Unspecified atrial fibrillation: Secondary | ICD-10-CM

## 2017-03-15 DIAGNOSIS — Z5181 Encounter for therapeutic drug level monitoring: Secondary | ICD-10-CM

## 2017-03-15 LAB — POCT INR: INR: 1

## 2017-03-22 ENCOUNTER — Ambulatory Visit (INDEPENDENT_AMBULATORY_CARE_PROVIDER_SITE_OTHER): Payer: Medicare HMO | Admitting: *Deleted

## 2017-03-22 DIAGNOSIS — Z5181 Encounter for therapeutic drug level monitoring: Secondary | ICD-10-CM

## 2017-03-22 DIAGNOSIS — I4891 Unspecified atrial fibrillation: Secondary | ICD-10-CM | POA: Diagnosis not present

## 2017-03-22 LAB — POCT INR: INR: 2.3

## 2017-04-10 DIAGNOSIS — M109 Gout, unspecified: Secondary | ICD-10-CM | POA: Diagnosis not present

## 2017-04-10 DIAGNOSIS — E784 Other hyperlipidemia: Secondary | ICD-10-CM | POA: Diagnosis not present

## 2017-04-10 DIAGNOSIS — G459 Transient cerebral ischemic attack, unspecified: Secondary | ICD-10-CM | POA: Diagnosis not present

## 2017-04-10 DIAGNOSIS — Z0000 Encounter for general adult medical examination without abnormal findings: Secondary | ICD-10-CM | POA: Diagnosis not present

## 2017-04-10 DIAGNOSIS — Z23 Encounter for immunization: Secondary | ICD-10-CM | POA: Diagnosis not present

## 2017-04-10 DIAGNOSIS — N189 Chronic kidney disease, unspecified: Secondary | ICD-10-CM | POA: Diagnosis not present

## 2017-04-10 DIAGNOSIS — E1165 Type 2 diabetes mellitus with hyperglycemia: Secondary | ICD-10-CM | POA: Diagnosis not present

## 2017-04-10 DIAGNOSIS — Z1389 Encounter for screening for other disorder: Secondary | ICD-10-CM | POA: Diagnosis not present

## 2017-04-10 DIAGNOSIS — I200 Unstable angina: Secondary | ICD-10-CM | POA: Diagnosis not present

## 2017-04-10 DIAGNOSIS — E119 Type 2 diabetes mellitus without complications: Secondary | ICD-10-CM | POA: Diagnosis not present

## 2017-04-10 DIAGNOSIS — I10 Essential (primary) hypertension: Secondary | ICD-10-CM | POA: Diagnosis not present

## 2017-04-10 DIAGNOSIS — D649 Anemia, unspecified: Secondary | ICD-10-CM | POA: Diagnosis not present

## 2017-04-10 DIAGNOSIS — I120 Hypertensive chronic kidney disease with stage 5 chronic kidney disease or end stage renal disease: Secondary | ICD-10-CM | POA: Diagnosis not present

## 2017-04-19 ENCOUNTER — Ambulatory Visit (INDEPENDENT_AMBULATORY_CARE_PROVIDER_SITE_OTHER): Payer: Medicare HMO | Admitting: *Deleted

## 2017-04-19 DIAGNOSIS — Z5181 Encounter for therapeutic drug level monitoring: Secondary | ICD-10-CM | POA: Diagnosis not present

## 2017-04-19 DIAGNOSIS — I4891 Unspecified atrial fibrillation: Secondary | ICD-10-CM | POA: Diagnosis not present

## 2017-04-19 LAB — POCT INR: INR: 2.3

## 2017-04-22 ENCOUNTER — Encounter (HOSPITAL_COMMUNITY): Payer: Self-pay | Admitting: *Deleted

## 2017-04-22 ENCOUNTER — Emergency Department (HOSPITAL_COMMUNITY)
Admission: EM | Admit: 2017-04-22 | Discharge: 2017-04-23 | Disposition: A | Discharge status: Home / Self Care | Payer: Medicare HMO | Attending: Emergency Medicine | Admitting: Emergency Medicine

## 2017-04-22 DIAGNOSIS — Z79899 Other long term (current) drug therapy: Secondary | ICD-10-CM | POA: Diagnosis not present

## 2017-04-22 DIAGNOSIS — N183 Chronic kidney disease, stage 3 (moderate): Secondary | ICD-10-CM | POA: Insufficient documentation

## 2017-04-22 DIAGNOSIS — E1122 Type 2 diabetes mellitus with diabetic chronic kidney disease: Secondary | ICD-10-CM | POA: Insufficient documentation

## 2017-04-22 DIAGNOSIS — Z7901 Long term (current) use of anticoagulants: Secondary | ICD-10-CM | POA: Insufficient documentation

## 2017-04-22 DIAGNOSIS — Z7984 Long term (current) use of oral hypoglycemic drugs: Secondary | ICD-10-CM | POA: Diagnosis not present

## 2017-04-22 DIAGNOSIS — I129 Hypertensive chronic kidney disease with stage 1 through stage 4 chronic kidney disease, or unspecified chronic kidney disease: Secondary | ICD-10-CM | POA: Diagnosis not present

## 2017-04-22 DIAGNOSIS — R112 Nausea with vomiting, unspecified: Secondary | ICD-10-CM | POA: Diagnosis not present

## 2017-04-22 DIAGNOSIS — R197 Diarrhea, unspecified: Secondary | ICD-10-CM | POA: Insufficient documentation

## 2017-04-22 DIAGNOSIS — Z87891 Personal history of nicotine dependence: Secondary | ICD-10-CM | POA: Insufficient documentation

## 2017-04-22 DIAGNOSIS — E860 Dehydration: Secondary | ICD-10-CM | POA: Diagnosis not present

## 2017-04-22 DIAGNOSIS — Z85038 Personal history of other malignant neoplasm of large intestine: Secondary | ICD-10-CM | POA: Diagnosis not present

## 2017-04-22 DIAGNOSIS — R1110 Vomiting, unspecified: Secondary | ICD-10-CM | POA: Diagnosis present

## 2017-04-22 LAB — CBG MONITORING, ED: Glucose-Capillary: 193 mg/dL — ABNORMAL HIGH (ref 65–99)

## 2017-04-22 MED ORDER — ONDANSETRON HCL 4 MG/2ML IJ SOLN
4.0000 mg | Freq: Once | INTRAMUSCULAR | Status: AC
  Administered 2017-04-23: 4 mg via INTRAVENOUS
  Filled 2017-04-22: qty 2

## 2017-04-22 MED ORDER — SODIUM CHLORIDE 0.9 % IV BOLUS (SEPSIS)
1000.0000 mL | Freq: Once | INTRAVENOUS | Status: AC
  Administered 2017-04-23: 1000 mL via INTRAVENOUS

## 2017-04-22 MED ORDER — SODIUM CHLORIDE 0.9 % IV BOLUS (SEPSIS)
500.0000 mL | Freq: Once | INTRAVENOUS | Status: AC
  Administered 2017-04-23: 500 mL via INTRAVENOUS

## 2017-04-22 NOTE — ED Triage Notes (Signed)
Pt reports n/v/d that started having n/v/d when she woke up this morning. Pt has a low grade temp. Pt unable to keep anything down

## 2017-04-23 DIAGNOSIS — Z85038 Personal history of other malignant neoplasm of large intestine: Secondary | ICD-10-CM | POA: Diagnosis not present

## 2017-04-23 DIAGNOSIS — R197 Diarrhea, unspecified: Secondary | ICD-10-CM | POA: Diagnosis not present

## 2017-04-23 DIAGNOSIS — R112 Nausea with vomiting, unspecified: Secondary | ICD-10-CM | POA: Diagnosis not present

## 2017-04-23 DIAGNOSIS — I129 Hypertensive chronic kidney disease with stage 1 through stage 4 chronic kidney disease, or unspecified chronic kidney disease: Secondary | ICD-10-CM | POA: Diagnosis not present

## 2017-04-23 DIAGNOSIS — Z7901 Long term (current) use of anticoagulants: Secondary | ICD-10-CM | POA: Diagnosis not present

## 2017-04-23 DIAGNOSIS — Z79899 Other long term (current) drug therapy: Secondary | ICD-10-CM | POA: Diagnosis not present

## 2017-04-23 DIAGNOSIS — N183 Chronic kidney disease, stage 3 (moderate): Secondary | ICD-10-CM | POA: Diagnosis not present

## 2017-04-23 DIAGNOSIS — E1122 Type 2 diabetes mellitus with diabetic chronic kidney disease: Secondary | ICD-10-CM | POA: Diagnosis not present

## 2017-04-23 DIAGNOSIS — Z7984 Long term (current) use of oral hypoglycemic drugs: Secondary | ICD-10-CM | POA: Diagnosis not present

## 2017-04-23 DIAGNOSIS — Z87891 Personal history of nicotine dependence: Secondary | ICD-10-CM | POA: Diagnosis not present

## 2017-04-23 LAB — URINALYSIS, ROUTINE W REFLEX MICROSCOPIC
BACTERIA UA: NONE SEEN
Bilirubin Urine: NEGATIVE
GLUCOSE, UA: NEGATIVE mg/dL
KETONES UR: NEGATIVE mg/dL
LEUKOCYTES UA: NEGATIVE
NITRITE: NEGATIVE
PROTEIN: 100 mg/dL — AB
Specific Gravity, Urine: 1.021 (ref 1.005–1.030)
pH: 5 (ref 5.0–8.0)

## 2017-04-23 LAB — COMPREHENSIVE METABOLIC PANEL
ALBUMIN: 3.9 g/dL (ref 3.5–5.0)
ALK PHOS: 144 U/L — AB (ref 38–126)
ALT: 11 U/L — ABNORMAL LOW (ref 14–54)
ANION GAP: 15 (ref 5–15)
AST: 26 U/L (ref 15–41)
BUN: 15 mg/dL (ref 6–20)
CALCIUM: 9.1 mg/dL (ref 8.9–10.3)
CHLORIDE: 96 mmol/L — AB (ref 101–111)
CO2: 23 mmol/L (ref 22–32)
Creatinine, Ser: 1.43 mg/dL — ABNORMAL HIGH (ref 0.44–1.00)
GFR calc non Af Amer: 33 mL/min — ABNORMAL LOW (ref >60)
GFR, EST AFRICAN AMERICAN: 38 mL/min — AB (ref >60)
GLUCOSE: 192 mg/dL — AB (ref 65–99)
POTASSIUM: 3.8 mmol/L (ref 3.5–5.1)
SODIUM: 134 mmol/L — AB (ref 135–145)
Total Bilirubin: 0.7 mg/dL (ref 0.3–1.2)
Total Protein: 8 g/dL (ref 6.5–8.1)

## 2017-04-23 LAB — CBC WITH DIFFERENTIAL/PLATELET
BASOS ABS: 0 10*3/uL (ref 0.0–0.1)
BASOS PCT: 0 %
EOS PCT: 0 %
Eosinophils Absolute: 0 10*3/uL (ref 0.0–0.7)
HCT: 38.8 % (ref 36.0–46.0)
Hemoglobin: 13 g/dL (ref 12.0–15.0)
LYMPHS PCT: 10 %
Lymphs Abs: 0.9 10*3/uL (ref 0.7–4.0)
MCH: 28.1 pg (ref 26.0–34.0)
MCHC: 33.5 g/dL (ref 30.0–36.0)
MCV: 83.8 fL (ref 78.0–100.0)
MONO ABS: 0.6 10*3/uL (ref 0.1–1.0)
Monocytes Relative: 7 %
Neutro Abs: 6.8 10*3/uL (ref 1.7–7.7)
Neutrophils Relative %: 83 %
PLATELETS: 181 10*3/uL (ref 150–400)
RBC: 4.63 MIL/uL (ref 3.87–5.11)
RDW: 14.7 % (ref 11.5–15.5)
WBC: 8.3 10*3/uL (ref 4.0–10.5)

## 2017-04-23 LAB — LIPASE, BLOOD: LIPASE: 28 U/L (ref 11–51)

## 2017-04-23 MED ORDER — ONDANSETRON HCL 4 MG PO TABS: 4.0000 mg | ORAL_TABLET | Freq: Three times a day (TID) | ORAL | 0 refills | Status: DC | PRN

## 2017-04-23 MED ORDER — LOPERAMIDE HCL 2 MG PO CAPS
4.0000 mg | ORAL_CAPSULE | Freq: Once | ORAL | Status: AC
  Administered 2017-04-23: 4 mg via ORAL
  Filled 2017-04-23: qty 2

## 2017-04-23 MED ORDER — ACETAMINOPHEN 325 MG PO TABS
650.0000 mg | ORAL_TABLET | Freq: Once | ORAL | Status: AC
  Administered 2017-04-23: 650 mg via ORAL
  Filled 2017-04-23: qty 2

## 2017-04-23 MED ORDER — SODIUM CHLORIDE 0.9 % IV BOLUS (SEPSIS)
500.0000 mL | Freq: Once | INTRAVENOUS | Status: AC
  Administered 2017-04-23: 500 mL via INTRAVENOUS

## 2017-04-23 NOTE — ED Provider Notes (Signed)
[facility] DEPT Provider Note   CSN: [id number] Arrival date & time: 04/22/17  2223  Time seen 23:40 PM   History   Chief Complaint Chief Complaint  Patient presents with  . Emesis    HPI Katrina Arnold is a 83 y.o. female.  HPI  patient states she started feeling bad on October 4 with vomiting and diarrhea. She states she's having 3-4 loose stools a day and vomiting about 5-6 times a day. She specially has the vomiting if she tries to eat or drink anything. She denies any abdominal pain. She states she does feel a little lightheaded but denies feeling dizzy. She denies eating anything that could've made her feel ill, she has not been around anybody else is ill, she has not been on antibiotics recently. She also has not eaten any hamburger due to the recent salmonella recall.  PCP Rosita Fire, MD   Past Medical History:  Diagnosis Date  . Atrial fibrillation, chronic (HCC)    normal EF  . Cerebrovascular disease    Left cerebral CVA 2007; residual right sided weakness; 7/09 mild plaque; no focal stenosis  . Chronic anticoagulation   . Chronic kidney disease, stage 3, mod decreased GFR (HCC)    Creatinine of 1.5 in 10/2008; h/o hyper- and hypokalemia  . Colon adenocarcinoma ([place]) 2009   s/p right hemicolectomy  . Diabetes mellitus, type 2 ([place])   . Dupuytren's contracture   . Hyperlipidemia   . Hypertension     Patient Active Problem List   Diagnosis Date Noted  . Encounter for therapeutic drug monitoring 09/25/2013  . Chronic anticoagulation 10/13/2010  . CEREBROVASCULAR DISEASE 11/17/2009  . Hx of Adenocarcinoma of colon 01/05/2009  . DIABETES MELLITUS, TYPE II 01/05/2009  . HYPERLIPIDEMIA 01/05/2009  . ANEMIA 01/05/2009  . Hypertension 01/05/2009  . Atrial fibrillation ([place]) 01/05/2009  . CHRONIC KIDNEY DISEASE STAGE II (MILD) 01/05/2009    Past Surgical History:  Procedure Laterality Date  . APPENDECTOMY    . COLONOSCOPY  6/23/200   Rourk: Left  diverticula, multiple polyps, adenocarcinoma of colon 8 cm distal to ICV  . COLONOSCOPY  07/02/2012   Procedure: COLONOSCOPY;  Surgeon: Daneil Dolin, MD;  Location: [facility];  Service: Endoscopy;  Laterality: N/A;  9:30  . COLONOSCOPY W/ POLYPECTOMY  05/2009   Rourk: internal hemorrhoidal tag, site of piecemeal polypectomy in  August identified, residual polypectomy.  Multiple tubular adenomas/tubulovillous  . HEMICOLECTOMY  12/2007   Right, adenocarcinoma  . TUBAL LIGATION      OB History    No data available       Home Medications    Prior to Admission medications   Medication Sig Start Date End Date Taking? Authorizing Provider  acetaminophen (TYLENOL) 500 MG tablet Take 500 mg by mouth as needed.      [provider]  amLODipine (NORVASC) 5 MG tablet Take 1 tablet (5 mg total) by mouth daily. 09/25/13   Arnoldo Lenis, MD  cephALEXin (KEFLEX) 500 MG capsule Take 1 capsule (500 mg total) by mouth 2 (two) times daily. 01/17/17   Tuchman, Leslye Peer, DPM  colchicine 0.6 MG tablet Take 1 tablet (0.6 mg total) by mouth daily. 01/17/17 02/16/17  Gean Birchwood, DPM  glipiZIDE (GLUCOTROL XL) 2.5 MG 24 hr tablet Take 2.5 mg by mouth daily.      [provider]  hydrochlorothiazide (MICROZIDE) 12.5 MG capsule Take 12.5 mg by mouth daily.    [provider]  linagliptin (TRADJENTA) 5 MG TABS tablet Take 5 mg by mouth daily.    [provider]  metFORMIN (GLUCOPHAGE) 500 MG tablet Take 500 mg by mouth 2 (two) times daily with a meal.      [provider]  metoprolol (LOPRESSOR) 50 MG tablet Take 50 mg by mouth 2 (two) times daily.      [provider]  omeprazole (PRILOSEC) 20 MG capsule Take 20 mg by mouth daily.      [provider]  ondansetron (ZOFRAN) 4 MG tablet Take 1 tablet (4 mg total) by mouth every 8 (eight) hours as needed. 04/23/17   Rolland Porter, MD  potassium chloride SA (K-DUR,KLOR-CON) 20 MEQ tablet TAKE 1  TABLET BY MOUTH EVERY DAY 08/18/11   Wall, Marijo Conception, MD  predniSONE (STERAPRED UNI-PAK 21 TAB) 10 MG (21) TBPK tablet Tapering 6 days dose pack 01/25/17   Tuchman, Richard C, DPM  simvastatin (ZOCOR) 20 MG tablet Take 20 mg by mouth at bedtime.     [provider]  warfarin (COUMADIN) 5 MG tablet TAKE 1 TABLET BY MOUTH DAILY EXCEPT TAKE 1/2 TABLET ON MONDAYS AND THURSDAYS AS DIRECTED 03/14/17   Arnoldo Lenis, MD    Family History Family History  Problem Relation Age of Onset  . Diabetes Mother   . Heart attack Father   . Lung cancer Son     Social History Social History  Substance Use Topics  . Smoking status: Never Smoker  . Smokeless tobacco: Current User    Types: Snuff  . Alcohol use No  lives at home Lives with husband   Allergies   Patient has no known allergies.   Review of Systems Review of Systems  All other systems reviewed and are negative.    Physical Exam Updated Vital Signs BP (!) 126/92 (BP Location: Left Arm)   Pulse (!) 111   Temp (!) 100.6 F (38.1 C) (Oral)   Resp 18   Ht 5' 2.5" (1.588 m)   Wt 68.9 kg (152 lb)   SpO2 97%   BMI 27.36 kg/m   Physical Exam  Constitutional: She is oriented to person, place, and time. She appears well-developed and well-nourished.  Non-toxic appearance. She does not appear ill. No distress.  HENT:  Head: Normocephalic and atraumatic.  Right Ear: External ear normal.  Left Ear: External ear normal.  Nose: Nose normal. No mucosal edema or rhinorrhea.  Mouth/Throat: Mucous membranes are dry. No dental abscesses or uvula swelling.  Eyes: Pupils are equal, round, and reactive to light. Conjunctivae and EOM are normal.  Neck: Normal range of motion and full passive range of motion without pain. Neck supple.  Cardiovascular: Normal rate, regular rhythm and normal heart sounds.  Exam reveals no gallop and no friction rub.   No murmur heard. Pulmonary/Chest: Effort normal and breath sounds normal. No  respiratory distress. She has no wheezes. She has no rhonchi. She has no rales. She exhibits no tenderness and no crepitus.  Abdominal: Soft. Normal appearance and bowel sounds are normal. She exhibits no distension. There is no tenderness. There is no rebound and no guarding.  Musculoskeletal: Normal range of motion. She exhibits no edema or tenderness.  Moves all extremities well.   Neurological: She is alert and oriented to person, place, and time. She has normal strength. No cranial nerve deficit.  Skin: Skin is warm, dry and intact. No rash noted. No erythema. No pallor.  Psychiatric: She has a normal mood and  affect. Her speech is normal and behavior is normal. Her mood appears not anxious.  Nursing note and vitals reviewed.    ED Treatments / Results  Labs (all labs ordered are listed, but only abnormal results are displayed) Results for orders placed or performed during the hospital encounter of 04/22/17  Urinalysis, Routine w reflex microscopic  Result Value Ref Range   Color, Urine YELLOW YELLOW   APPearance HAZY (A) CLEAR   Specific Gravity, Urine 1.021 1.005 - 1.030   pH 5.0 5.0 - 8.0   Glucose, UA NEGATIVE NEGATIVE mg/dL   Hgb urine dipstick MODERATE (A) NEGATIVE   Bilirubin Urine NEGATIVE NEGATIVE   Ketones, ur NEGATIVE NEGATIVE mg/dL   Protein, ur 100 (A) NEGATIVE mg/dL   Nitrite NEGATIVE NEGATIVE   Leukocytes, UA NEGATIVE NEGATIVE   RBC / HPF 0-5 0 - 5 RBC/hpf   WBC, UA 0-5 0 - 5 WBC/hpf   Bacteria, UA NONE SEEN NONE SEEN   Squamous Epithelial / LPF 0-5 (A) NONE SEEN   Mucus PRESENT    Hyaline Casts, UA PRESENT   CBC with Differential  Result Value Ref Range   WBC 8.3 4.0 - 10.5 K/uL   RBC 4.63 3.87 - 5.11 MIL/uL   Hemoglobin 13.0 12.0 - 15.0 g/dL   HCT 38.8 36.0 - 46.0 %   MCV 83.8 78.0 - 100.0 fL   MCH 28.1 26.0 - 34.0 pg   MCHC 33.5 30.0 - 36.0 g/dL   RDW 14.7 11.5 - 15.5 %   Platelets 181 150 - 400 K/uL   Neutrophils Relative % 83 %   Neutro Abs 6.8  1.7 - 7.7 K/uL   Lymphocytes Relative 10 %   Lymphs Abs 0.9 0.7 - 4.0 K/uL   Monocytes Relative 7 %   Monocytes Absolute 0.6 0.1 - 1.0 K/uL   Eosinophils Relative 0 %   Eosinophils Absolute 0.0 0.0 - 0.7 K/uL   Basophils Relative 0 %   Basophils Absolute 0.0 0.0 - 0.1 K/uL  Comprehensive metabolic panel  Result Value Ref Range   Sodium 134 (L) 135 - 145 mmol/L   Potassium 3.8 3.5 - 5.1 mmol/L   Chloride 96 (L) 101 - 111 mmol/L   CO2 23 22 - 32 mmol/L   Glucose, Bld 192 (H) 65 - 99 mg/dL   BUN 15 6 - 20 mg/dL   Creatinine, Ser 1.43 (H) 0.44 - 1.00 mg/dL   Calcium 9.1 8.9 - 10.3 mg/dL   Total Protein 8.0 6.5 - 8.1 g/dL   Albumin 3.9 3.5 - 5.0 g/dL   AST 26 15 - 41 U/L   ALT 11 (L) 14 - 54 U/L   Alkaline Phosphatase 144 (H) 38 - 126 U/L   Total Bilirubin 0.7 0.3 - 1.2 mg/dL   GFR calc non Af Amer 33 (L) >60 mL/min   GFR calc Af Amer 38 (L) >60 mL/min   Anion gap 15 5 - 15  Lipase, blood  Result Value Ref Range   Lipase 28 11 - 51 U/L  CBG monitoring, ED  Result Value Ref Range   Glucose-Capillary 193 (H) 65 - 99 mg/dL   Laboratory interpretation all normal except Mild hyponatremia and low chloride consistent with dehydration, hyperglycemia, mild renal insufficiency also consistent with dehydration.    EKG  EKG Interpretation None       Radiology No results found.  Procedures Procedures (including critical care time)  Medications Ordered in ED Medications  sodium chloride 0.9 % bolus  1,000 mL (0 mLs Intravenous Stopped 04/23/17 0252)  sodium chloride 0.9 % bolus 500 mL (0 mLs Intravenous Stopped 04/23/17 0404)  ondansetron (ZOFRAN) injection 4 mg (4 mg Intravenous Given 04/23/17 0005)  loperamide (IMODIUM) capsule 4 mg (4 mg Oral Given 04/23/17 0253)  sodium chloride 0.9 % bolus 500 mL (0 mLs Intravenous Stopped 04/23/17 0404)     Initial Impression / Assessment and Plan / ED Course  I have reviewed the triage vital signs and the nursing notes.  Pertinent labs  & imaging results that were available during my care of the patient were reviewed by me and considered in my medical decision making (see chart for details).    Patient was given 1500 mL of normal saline for hydration. She was given Zofran IV for her nausea. She states her last episode of diarrhea was earlier his morning. She was advised to let me know if she had diarrhea in the ED daily would start Imodium.  Patient was rechecked at 2:40 AM she states she did have one episode of small amount of diarrhea. Imodium was ordered. She is having some urinary output. She states her mouth still feels a little bit dry. She was given an additional 500 mL bolus of fluid and she is agreeable to try doing oral fluid trial.   4:20 AM patient has not had any more vomiting or diarrhea since she got Imodium. She has taken oral fluids without vomiting. She feels ready to be discharged.   Final Clinical Impressions(s) / ED Diagnoses   Final diagnoses:  Nausea vomiting and diarrhea  Dehydration    New Prescriptions New Prescriptions   ONDANSETRON (ZOFRAN) 4 MG TABLET    Take 1 tablet (4 mg total) by mouth every 8 (eight) hours as needed.  OTC imodium  Plan discharge  Rolland Porter, MD, Barbette Or, MD 04/23/17 [phone]

## 2017-04-23 NOTE — Discharge Instructions (Signed)
Drink plenty of fluids (clear liquids) then start a bland diet later this morning such as toast, crackers, jello, [employer] chicken noodle soup. Use the zofran for nausea or vomiting. Take imodium OTC for diarrhea. Avoid milk products until the diarrhea is gone. Return to the ED if you get worse instead of better.

## 2017-05-02 ENCOUNTER — Encounter: Payer: Self-pay | Admitting: Cardiology

## 2017-05-02 ENCOUNTER — Ambulatory Visit (INDEPENDENT_AMBULATORY_CARE_PROVIDER_SITE_OTHER): Payer: Medicare HMO | Admitting: Cardiology

## 2017-05-02 VITALS — BP 122/62 | HR 84 | Ht 62.0 in | Wt 159.0 lb

## 2017-05-02 DIAGNOSIS — I10 Essential (primary) hypertension: Secondary | ICD-10-CM

## 2017-05-02 DIAGNOSIS — N179 Acute kidney failure, unspecified: Secondary | ICD-10-CM | POA: Diagnosis not present

## 2017-05-02 DIAGNOSIS — I4891 Unspecified atrial fibrillation: Secondary | ICD-10-CM | POA: Diagnosis not present

## 2017-05-02 DIAGNOSIS — E782 Mixed hyperlipidemia: Secondary | ICD-10-CM | POA: Diagnosis not present

## 2017-05-02 NOTE — Progress Notes (Signed)
Clinical Summary Ms. Carico is a 83 y.o.female seen today for follow up of the following medical problems.   1. Afib - can have some palpitations, infrequent - compliant with meds. No bleeding on coumadin.  - not interested in NOACs due to cost.   2. HTN - compliant with meds - home bp's around 120/60s  3. Hyperlipidemia - compliant with statin  4. Hx of colon CA - prior right hemocolectomy - followed by GI Past Medical History:  Diagnosis Date  . Atrial fibrillation, chronic (HCC)    normal EF  . Cerebrovascular disease    Left cerebral CVA 2007; residual right sided weakness; 7/09 mild plaque; no focal stenosis  . Chronic anticoagulation   . Chronic kidney disease, stage 3, mod decreased GFR (HCC)    Creatinine of 1.5 in 10/2008; h/o hyper- and hypokalemia  . Colon adenocarcinoma ([place]) 2009   s/p right hemicolectomy  . Diabetes mellitus, type 2 ([place])   . Dupuytren's contracture   . Hyperlipidemia   . Hypertension      No Known Allergies   Current Outpatient Prescriptions  Medication Sig Dispense Refill  . acetaminophen (TYLENOL) 500 MG tablet Take 500 mg by mouth as needed.      Marland Kitchen amLODipine (NORVASC) 5 MG tablet Take 1 tablet (5 mg total) by mouth daily. 30 tablet 6  . cephALEXin (KEFLEX) 500 MG capsule Take 1 capsule (500 mg total) by mouth 2 (two) times daily. 14 capsule 0  . colchicine 0.6 MG tablet Take 1 tablet (0.6 mg total) by mouth daily. 30 tablet 0  . glipiZIDE (GLUCOTROL XL) 2.5 MG 24 hr tablet Take 2.5 mg by mouth daily.      . hydrochlorothiazide (MICROZIDE) 12.5 MG capsule Take 12.5 mg by mouth daily.    Marland Kitchen linagliptin (TRADJENTA) 5 MG TABS tablet Take 5 mg by mouth daily.    . metFORMIN (GLUCOPHAGE) 500 MG tablet Take 500 mg by mouth 2 (two) times daily with a meal.      . metoprolol (LOPRESSOR) 50 MG tablet Take 50 mg by mouth 2 (two) times daily.      Marland Kitchen omeprazole (PRILOSEC) 20 MG capsule Take 20 mg by mouth daily.      . ondansetron  (ZOFRAN) 4 MG tablet Take 1 tablet (4 mg total) by mouth every 8 (eight) hours as needed. 6 tablet 0  . potassium chloride SA (K-DUR,KLOR-CON) 20 MEQ tablet TAKE 1 TABLET BY MOUTH EVERY DAY 30 tablet 0  . predniSONE (STERAPRED UNI-PAK 21 TAB) 10 MG (21) TBPK tablet Tapering 6 days dose pack 21 tablet 0  . simvastatin (ZOCOR) 20 MG tablet Take 20 mg by mouth at bedtime.     Marland Kitchen warfarin (COUMADIN) 5 MG tablet TAKE 1 TABLET BY MOUTH DAILY EXCEPT TAKE 1/2 TABLET ON MONDAYS AND THURSDAYS AS DIRECTED 30 tablet 4   No current facility-administered medications for this visit.      Past Surgical History:  Procedure Laterality Date  . APPENDECTOMY    . COLONOSCOPY  6/23/200   Rourk: Left diverticula, multiple polyps, adenocarcinoma of colon 8 cm distal to ICV  . COLONOSCOPY  07/02/2012   Procedure: COLONOSCOPY;  Surgeon: Daneil Dolin, MD;  Location[facility];  Service: Endoscopy;  Laterality: N/A;  9:30  . COLONOSCOPY W/ POLYPECTOMY  05/2009   Rourk: internal hemorrhoidal tag, site of piecemeal polypectomy in  August identified, residual polypectomy.  Multiple tubular adenomas/tubulovillous  . HEMICOLECTOMY  12/2007  Right, adenocarcinoma  . TUBAL LIGATION       No Known Allergies    Family History  Problem Relation Age of Onset  . Diabetes Mother   . Heart attack Father   . Lung cancer Son      Social History Ms. Penn reports that she has never smoked. Her smokeless tobacco use includes Snuff. Ms. Besse reports that she does not drink alcohol.   Review of Systems CONSTITUTIONAL: No weight loss, fever, chills, weakness or fatigue.  HEENT: Eyes: No visual loss, blurred vision, double vision or yellow sclerae.No hearing loss, sneezing, congestion, runny nose or sore throat.  SKIN: No rash or itching.  CARDIOVASCULAR: per hpi RESPIRATORY: No shortness of breath, cough or sputum.  GASTROINTESTINAL: No anorexia, nausea, vomiting or diarrhea. No abdominal pain or blood.    GENITOURINARY: No burning on urination, no polyuria NEUROLOGICAL: No headache, dizziness, syncope, paralysis, ataxia, numbness or tingling in the extremities. No change in bowel or bladder control.  MUSCULOSKELETAL: No muscle, back pain, joint pain or stiffness.  LYMPHATICS: No enlarged nodes. No history of splenectomy.  PSYCHIATRIC: No history of depression or anxiety.  ENDOCRINOLOGIC: No reports of sweating, cold or heat intolerance. No polyuria or polydipsia.  Marland Kitchen   Physical Examination Vitals:   05/02/17 1544  BP: 122/62  Pulse: 84  SpO2: 95%   Vitals:   05/02/17 1544  Weight: 159 lb (72.1 kg)  Height: 5\' 2"  (1.575 m)    Gen: resting comfortably, no acute distress HEENT: no scleral icterus, pupils equal round and reactive, no palptable cervical adenopathy,  CV: irreg, no m/r/g, no jvd Resp: Clear to auscultation bilaterally GI: abdomen is soft, non-tender, non-distended, normal bowel sounds, no hepatosplenomegaly MSK: extremities are warm, no edema.  Skin: warm, no rash Neuro:  no focal deficits Psych: appropriate affect    Assessment and Plan  1. Afib - no symptoms, continue current meds including anticoagulation.  - EKG in clinic today shows rate controlled afib  2. HTN - bp's at goal, continue current meds  3. Hyperlipidemia -request labs from pcp, she will continue statin  4. AKI - recent AKI, repeat BMET to resassess renal function  Follow up 6 months      Arnoldo Lenis, M.D.

## 2017-05-02 NOTE — Patient Instructions (Signed)

## 2017-05-17 ENCOUNTER — Ambulatory Visit (INDEPENDENT_AMBULATORY_CARE_PROVIDER_SITE_OTHER): Payer: Medicare HMO | Admitting: *Deleted

## 2017-05-17 DIAGNOSIS — I4891 Unspecified atrial fibrillation: Secondary | ICD-10-CM | POA: Diagnosis not present

## 2017-05-17 DIAGNOSIS — Z5181 Encounter for therapeutic drug level monitoring: Secondary | ICD-10-CM

## 2017-05-17 LAB — POCT INR: INR: 3

## 2017-06-01 ENCOUNTER — Other Ambulatory Visit: Payer: Self-pay | Admitting: *Deleted

## 2017-06-01 MED ORDER — WARFARIN SODIUM 5 MG PO TABS: ORAL_TABLET | ORAL | 1 refills | Status: DC

## 2017-06-21 ENCOUNTER — Ambulatory Visit (INDEPENDENT_AMBULATORY_CARE_PROVIDER_SITE_OTHER): Payer: Medicare HMO | Admitting: *Deleted

## 2017-06-21 DIAGNOSIS — I4891 Unspecified atrial fibrillation: Secondary | ICD-10-CM

## 2017-06-21 DIAGNOSIS — Z5181 Encounter for therapeutic drug level monitoring: Secondary | ICD-10-CM

## 2017-06-21 LAB — POCT INR: INR: 1.8

## 2017-07-19 ENCOUNTER — Ambulatory Visit (INDEPENDENT_AMBULATORY_CARE_PROVIDER_SITE_OTHER): Payer: Medicare HMO | Admitting: *Deleted

## 2017-07-19 DIAGNOSIS — Z5181 Encounter for therapeutic drug level monitoring: Secondary | ICD-10-CM | POA: Diagnosis not present

## 2017-07-19 DIAGNOSIS — I4891 Unspecified atrial fibrillation: Secondary | ICD-10-CM | POA: Diagnosis not present

## 2017-07-19 LAB — POCT INR: INR: 3.3

## 2017-08-28 DIAGNOSIS — E1165 Type 2 diabetes mellitus with hyperglycemia: Secondary | ICD-10-CM | POA: Diagnosis not present

## 2017-08-28 DIAGNOSIS — I10 Essential (primary) hypertension: Secondary | ICD-10-CM | POA: Diagnosis not present

## 2017-08-28 DIAGNOSIS — I482 Chronic atrial fibrillation: Secondary | ICD-10-CM | POA: Diagnosis not present

## 2017-08-28 DIAGNOSIS — E7849 Other hyperlipidemia: Secondary | ICD-10-CM | POA: Diagnosis not present

## 2017-11-20 DIAGNOSIS — I10 Essential (primary) hypertension: Secondary | ICD-10-CM | POA: Diagnosis not present

## 2017-11-20 DIAGNOSIS — I482 Chronic atrial fibrillation: Secondary | ICD-10-CM | POA: Diagnosis not present

## 2017-11-20 DIAGNOSIS — I2510 Atherosclerotic heart disease of native coronary artery without angina pectoris: Secondary | ICD-10-CM | POA: Diagnosis not present

## 2017-11-20 DIAGNOSIS — E1165 Type 2 diabetes mellitus with hyperglycemia: Secondary | ICD-10-CM | POA: Diagnosis not present

## 2017-12-01 ENCOUNTER — Other Ambulatory Visit: Payer: Self-pay | Admitting: Cardiology

## 2017-12-12 ENCOUNTER — Emergency Department (HOSPITAL_COMMUNITY)
Admission: EM | Admit: 2017-12-12 | Discharge: 2017-12-12 | Disposition: A | Discharge status: Home / Self Care | Payer: Medicare HMO | Attending: Emergency Medicine | Admitting: Emergency Medicine

## 2017-12-12 ENCOUNTER — Encounter (HOSPITAL_COMMUNITY): Payer: Self-pay

## 2017-12-12 ENCOUNTER — Other Ambulatory Visit: Payer: Self-pay

## 2017-12-12 DIAGNOSIS — Z7984 Long term (current) use of oral hypoglycemic drugs: Secondary | ICD-10-CM | POA: Diagnosis not present

## 2017-12-12 DIAGNOSIS — I129 Hypertensive chronic kidney disease with stage 1 through stage 4 chronic kidney disease, or unspecified chronic kidney disease: Secondary | ICD-10-CM | POA: Diagnosis not present

## 2017-12-12 DIAGNOSIS — R55 Syncope and collapse: Secondary | ICD-10-CM | POA: Insufficient documentation

## 2017-12-12 DIAGNOSIS — N183 Chronic kidney disease, stage 3 (moderate): Secondary | ICD-10-CM | POA: Insufficient documentation

## 2017-12-12 DIAGNOSIS — T679XXA Effect of heat and light, unspecified, initial encounter: Secondary | ICD-10-CM

## 2017-12-12 DIAGNOSIS — Z79899 Other long term (current) drug therapy: Secondary | ICD-10-CM | POA: Diagnosis not present

## 2017-12-12 DIAGNOSIS — E119 Type 2 diabetes mellitus without complications: Secondary | ICD-10-CM | POA: Insufficient documentation

## 2017-12-12 DIAGNOSIS — W92XXXA Exposure to excessive heat of man-made origin, initial encounter: Secondary | ICD-10-CM | POA: Insufficient documentation

## 2017-12-12 DIAGNOSIS — R0682 Tachypnea, not elsewhere classified: Secondary | ICD-10-CM | POA: Diagnosis not present

## 2017-12-12 LAB — BASIC METABOLIC PANEL
Anion gap: 11 (ref 5–15)
BUN: 13 mg/dL (ref 6–20)
CO2: 25 mmol/L (ref 22–32)
Calcium: 9.2 mg/dL (ref 8.9–10.3)
Chloride: 103 mmol/L (ref 101–111)
Creatinine, Ser: 1.26 mg/dL — ABNORMAL HIGH (ref 0.44–1.00)
GFR calc Af Amer: 44 mL/min — ABNORMAL LOW (ref >60)
GFR, EST NON AFRICAN AMERICAN: 38 mL/min — AB (ref >60)
GLUCOSE: 99 mg/dL (ref 65–99)
Potassium: 4 mmol/L (ref 3.5–5.1)
Sodium: 139 mmol/L (ref 135–145)

## 2017-12-12 LAB — CBC
HCT: 37.7 % (ref 36.0–46.0)
Hemoglobin: 12 g/dL (ref 12.0–15.0)
MCH: 27.5 pg (ref 26.0–34.0)
MCHC: 31.8 g/dL (ref 30.0–36.0)
MCV: 86.3 fL (ref 78.0–100.0)
PLATELETS: 230 10*3/uL (ref 150–400)
RBC: 4.37 MIL/uL (ref 3.87–5.11)
RDW: 14.7 % (ref 11.5–15.5)
WBC: 6.8 10*3/uL (ref 4.0–10.5)

## 2017-12-12 MED ORDER — SODIUM CHLORIDE 0.9 % IV SOLN
INTRAVENOUS | Status: DC
  Administered 2017-12-12: 23:00:00 via INTRAVENOUS

## 2017-12-12 MED ORDER — SODIUM CHLORIDE 0.9 % IV BOLUS
500.0000 mL | Freq: Once | INTRAVENOUS | Status: AC
  Administered 2017-12-12: 500 mL via INTRAVENOUS

## 2017-12-12 NOTE — Discharge Instructions (Addendum)
Do your best to avoid hot environments for the next few days.  Return for any new or worse symptoms.  Make an appointment to follow-up with your regular doctor as needed.

## 2017-12-12 NOTE — ED Provider Notes (Signed)
[facility] EMERGENCY DEPARTMENT Provider Note   CSN: [id number] Arrival date & time: 12/12/17  1925     History   Chief Complaint Chief Complaint  Patient presents with  . Near Syncope    HPI Katrina Arnold is a 84 y.o. female.  Patient brought in by EMS.  Patient was cooking in a hot kitchen family members were there they confirmed that it was a hot environment.  Patient started to feel overheated she left the kitchen to go sit in front of the air conditioner unit.  She was very sweaty at the time.  She felt like she was going to pass out.  Family members stated that she was having difficulty getting her words out this may have lasted for about 15 minutes or less.  She was sort of repeating sentences.  There was a period of confusion.  Upon arrival here patient was alert and oriented speech was normal.  No obvious focal weaknesses.  Patient's had a stroke in the past and has some residual right arm weakness secondary to that.  Patient denies any pain or headache.  Patient now feels much better that she is in a cool environment.     Past Medical History:  Diagnosis Date  . Atrial fibrillation, chronic (HCC)    normal EF  . Cerebrovascular disease    Left cerebral CVA 2007; residual right sided weakness; 7/09 mild plaque; no focal stenosis  . Chronic anticoagulation   . Chronic kidney disease, stage 3, mod decreased GFR (HCC)    Creatinine of 1.5 in 10/2008; h/o hyper- and hypokalemia  . Colon adenocarcinoma ([place]) 2009   s/p right hemicolectomy  . Diabetes mellitus, type 2 ([place])   . Dupuytren's contracture   . Hyperlipidemia   . Hypertension     Patient Active Problem List   Diagnosis Date Noted  . Encounter for therapeutic drug monitoring 09/25/2013  . Chronic anticoagulation 10/13/2010  . CEREBROVASCULAR DISEASE 11/17/2009  . Hx of Adenocarcinoma of colon 01/05/2009  . DIABETES MELLITUS, TYPE II 01/05/2009  . HYPERLIPIDEMIA 01/05/2009  . ANEMIA 01/05/2009  .  Hypertension 01/05/2009  . Atrial fibrillation ([place]) 01/05/2009  . CHRONIC KIDNEY DISEASE STAGE II (MILD) 01/05/2009    Past Surgical History:  Procedure Laterality Date  . APPENDECTOMY    . COLONOSCOPY  6/23/200   Rourk: Left diverticula, multiple polyps, adenocarcinoma of colon 8 cm distal to ICV  . COLONOSCOPY  07/02/2012   Procedure: COLONOSCOPY;  Surgeon: Daneil Dolin, MD;  Location: [facility] SUITE;  Service: Endoscopy;  Laterality: N/A;  9:30  . COLONOSCOPY W/ POLYPECTOMY  05/2009   Rourk: internal hemorrhoidal tag, site of piecemeal polypectomy in  August identified, residual polypectomy.  Multiple tubular adenomas/tubulovillous  . HEMICOLECTOMY  12/2007   Right, adenocarcinoma  . TUBAL LIGATION       OB History   None      Home Medications    Prior to Admission medications   Medication Sig Start Date End Date Taking? Authorizing Provider  amLODipine (NORVASC) 5 MG tablet Take 1 tablet (5 mg total) by mouth daily. 09/25/13  Yes BranchAlphonse Guild, MD  glipiZIDE (GLUCOTROL XL) 2.5 MG 24 hr tablet Take 2.5 mg by mouth daily.     Yes [provider]  hydrochlorothiazide (MICROZIDE) 12.5 MG capsule Take 12.5 mg by mouth daily.   Yes [provider]  metFORMIN (GLUCOPHAGE) 500 MG tablet Take 500 mg by mouth 2 (two) times daily with a meal.  Yes [provider]  metoprolol (LOPRESSOR) 50 MG tablet Take 50 mg by mouth 2 (two) times daily.     Yes [provider]  omeprazole (PRILOSEC) 20 MG capsule Take 20 mg by mouth daily.     Yes [provider]  potassium chloride SA (K-DUR,KLOR-CON) 20 MEQ tablet TAKE 1 TABLET BY MOUTH EVERY DAY 08/18/11  Yes Wall, Marijo Conception, MD  simvastatin (ZOCOR) 20 MG tablet Take 20 mg by mouth at bedtime.    Yes [provider]  warfarin (COUMADIN) 5 MG tablet TAKE 1 TABLET BY MOUTH DAILY EXCEPT TAKE 1/2 TABLET ON MONDAYS AND THURSDAYS AS DIRECTED 12/04/17  Yes Branch, Alphonse Guild, MD    Family  History Family History  Problem Relation Age of Onset  . Diabetes Mother   . Heart attack Father   . Lung cancer Son   . CAD Sister   . Cancer Brother   . CAD Sister   . COPD Brother   . Diabetes Brother     Social History Social History   Tobacco Use  . Smoking status: Never Smoker  . Smokeless tobacco: Current User    Types: Snuff  Substance Use Topics  . Alcohol use: No    Alcohol/week: 0.0 oz  . Drug use: No     Allergies   Patient has no known allergies.   Review of Systems Review of Systems  Constitutional: Positive for diaphoresis and fatigue. Negative for fever.  HENT: Negative for congestion.   Eyes: Negative for visual disturbance.  Respiratory: Negative for shortness of breath.   Cardiovascular: Negative for chest pain.  Gastrointestinal: Negative for abdominal pain.  Genitourinary: Negative for dysuria.  Musculoskeletal: Negative for back pain and neck pain.  Skin: Negative for rash.  Neurological: Positive for speech difficulty and light-headedness. Negative for dizziness, syncope, weakness and headaches.  Hematological: Does not bruise/bleed easily.  Psychiatric/Behavioral: Negative for confusion.     Physical Exam Updated Vital Signs BP (!) 157/79   Pulse 82   Temp 98.7 F (37.1 C) (Oral)   Resp 18   Ht 1.575 m (5\' 2" )   Wt 73 kg (161 lb)   SpO2 99%   BMI 29.45 kg/m   Physical Exam  Constitutional: She is oriented to person, place, and time. She appears well-developed and well-nourished. No distress.  HENT:  Head: Normocephalic and atraumatic.  Mucous membranes dry.  Eyes: Pupils are equal, round, and reactive to light. Conjunctivae and EOM are normal.  Neck: Neck supple.  Cardiovascular: Normal rate, regular rhythm and normal heart sounds.  Pulmonary/Chest: Effort normal and breath sounds normal.  Abdominal: Soft. Bowel sounds are normal. There is no tenderness.  Musculoskeletal: Normal range of motion. She exhibits no edema.    Neurological: She is alert and oriented to person, place, and time. No cranial nerve deficit or sensory deficit. She exhibits normal muscle tone. Coordination normal.  Except for some mild baseline right upper extremity weakness  Skin: Skin is warm. Capillary refill takes less than 2 seconds. No rash noted.  Nursing note and vitals reviewed.    ED Treatments / Results  Labs (all labs ordered are listed, but only abnormal results are displayed) Labs Reviewed  BASIC METABOLIC PANEL - Abnormal; Notable for the following components:      Result Value   Creatinine, Ser 1.26 (*)    GFR calc non Af Amer 38 (*)    GFR calc Af Amer 44 (*)    All other components  within normal limits  CBC    EKG EKG Interpretation  Date/Time:  Tuesday Dec 12 2017 19:41:22 EDT Ventricular Rate:  78 PR Interval:    QRS Duration: 94 QT Interval:  416 QTC Calculation: 474 R Axis:   -32 Text Interpretation:  Atrial fibrillation Left axis deviation Low voltage, precordial leads Consider anterior infarct Borderline T abnormalities, inferior leads Confirmed by Fredia Sorrow [phone]) on 12/12/2017 7:59:42 PM   Radiology No results found.  Procedures Procedures (including critical care time)  Medications Ordered in ED Medications  0.9 %  sodium chloride infusion ( Intravenous New Bag/Given 12/12/17 2236)  sodium chloride 0.9 % bolus 500 mL (500 mLs Intravenous Bolus from Bag 12/12/17 2236)     Initial Impression / Assessment and Plan / ED Course  I have reviewed the triage vital signs and the nursing notes.  Pertinent labs & imaging results that were available during my care of the patient were reviewed by me and considered in my medical decision making (see chart for details).    Patient when she was at a hot environment return back to baseline.  There was some confusion and some repetitive speech pattern.  No obvious neuro focal deficit otherwise by family members.  Patient had previous stroke  before and does have some baseline right upper extremity weakness.  Patient did not pass out.  Basic labs here without significant abnormalities.  Patient did receive some IV fluids.  Cardiac monitoring without arrhythmia.  Patient has been talkative feeling back to normal.  Do not feel that patient had a stroke or TIA feel there was a component of heat exhaustion and near syncope.     Final Clinical Impressions(s) / ED Diagnoses   Final diagnoses:  Near syncope    ED Discharge Orders    None       Fredia Sorrow, MD 12/12/17 2344

## 2017-12-12 NOTE — ED Triage Notes (Signed)
Pt reports she was cooking in kitchen and says she "got real hot, and couldn't get my words out, all my memory left me, and I went and sat down because I felt like I was going to pass out. EMS reports possible heat exhaustion as the kitchen was hot from cooking. CBG was 273 by EMS upon arrival. EMS reports she had improved some when they got there, did have episode of repeating sentences in route. Pt alert and oriented x 4 at this time. Speech is WNL. NO weakness observed.

## 2017-12-14 DIAGNOSIS — I10 Essential (primary) hypertension: Secondary | ICD-10-CM | POA: Diagnosis not present

## 2017-12-14 DIAGNOSIS — E1165 Type 2 diabetes mellitus with hyperglycemia: Secondary | ICD-10-CM | POA: Diagnosis not present

## 2017-12-14 DIAGNOSIS — I482 Chronic atrial fibrillation: Secondary | ICD-10-CM | POA: Diagnosis not present

## 2017-12-14 DIAGNOSIS — R42 Dizziness and giddiness: Secondary | ICD-10-CM | POA: Diagnosis not present

## 2017-12-15 ENCOUNTER — Ambulatory Visit (INDEPENDENT_AMBULATORY_CARE_PROVIDER_SITE_OTHER): Payer: Medicare HMO | Admitting: *Deleted

## 2017-12-15 DIAGNOSIS — Z5181 Encounter for therapeutic drug level monitoring: Secondary | ICD-10-CM

## 2017-12-15 DIAGNOSIS — I4891 Unspecified atrial fibrillation: Secondary | ICD-10-CM | POA: Diagnosis not present

## 2017-12-15 LAB — POCT INR: INR: 3.6 — AB (ref 2.0–3.0)

## 2017-12-15 NOTE — Patient Instructions (Signed)
Hold coumadin tonight then decrease dose to 1 tablet daily except 1/2 tablet on Mondays, Wednesdays and Fridays.  Recheck 3weeks

## 2018-01-03 ENCOUNTER — Ambulatory Visit (INDEPENDENT_AMBULATORY_CARE_PROVIDER_SITE_OTHER): Payer: Medicare HMO | Admitting: *Deleted

## 2018-01-03 ENCOUNTER — Other Ambulatory Visit: Payer: Self-pay | Admitting: Cardiology

## 2018-01-03 DIAGNOSIS — I4891 Unspecified atrial fibrillation: Secondary | ICD-10-CM | POA: Diagnosis not present

## 2018-01-03 DIAGNOSIS — Z5181 Encounter for therapeutic drug level monitoring: Secondary | ICD-10-CM

## 2018-01-03 LAB — POCT INR: INR: 2.5 (ref 2.0–3.0)

## 2018-01-03 NOTE — Patient Instructions (Signed)
Continue coumadin 1 tablet daily except 1/2 tablet on Mondays, Wednesdays and Fridays.  Recheck 4 weeks

## 2018-01-31 ENCOUNTER — Ambulatory Visit (INDEPENDENT_AMBULATORY_CARE_PROVIDER_SITE_OTHER): Payer: Medicare HMO | Admitting: *Deleted

## 2018-01-31 DIAGNOSIS — I4891 Unspecified atrial fibrillation: Secondary | ICD-10-CM

## 2018-01-31 DIAGNOSIS — Z5181 Encounter for therapeutic drug level monitoring: Secondary | ICD-10-CM

## 2018-01-31 LAB — POCT INR: INR: 1.8 — AB (ref 2.0–3.0)

## 2018-01-31 NOTE — Patient Instructions (Signed)
Take coumadin 1 tablet tonight then resume 1 tablet daily except 1/2 tablet on Mondays, Wednesdays and Fridays.  Recheck 3 weeks

## 2018-02-20 DIAGNOSIS — E1165 Type 2 diabetes mellitus with hyperglycemia: Secondary | ICD-10-CM | POA: Diagnosis not present

## 2018-02-20 DIAGNOSIS — Z1331 Encounter for screening for depression: Secondary | ICD-10-CM | POA: Diagnosis not present

## 2018-02-20 DIAGNOSIS — E119 Type 2 diabetes mellitus without complications: Secondary | ICD-10-CM | POA: Diagnosis not present

## 2018-02-20 DIAGNOSIS — I2510 Atherosclerotic heart disease of native coronary artery without angina pectoris: Secondary | ICD-10-CM | POA: Diagnosis not present

## 2018-02-20 DIAGNOSIS — R69 Illness, unspecified: Secondary | ICD-10-CM | POA: Diagnosis not present

## 2018-02-20 DIAGNOSIS — I6350 Cerebral infarction due to unspecified occlusion or stenosis of unspecified cerebral artery: Secondary | ICD-10-CM | POA: Diagnosis not present

## 2018-02-20 DIAGNOSIS — Z1389 Encounter for screening for other disorder: Secondary | ICD-10-CM | POA: Diagnosis not present

## 2018-02-20 DIAGNOSIS — Z0001 Encounter for general adult medical examination with abnormal findings: Secondary | ICD-10-CM | POA: Diagnosis not present

## 2018-02-21 ENCOUNTER — Ambulatory Visit (INDEPENDENT_AMBULATORY_CARE_PROVIDER_SITE_OTHER): Payer: Medicare HMO | Admitting: *Deleted

## 2018-02-21 ENCOUNTER — Other Ambulatory Visit (HOSPITAL_COMMUNITY): Payer: Self-pay | Admitting: Internal Medicine

## 2018-02-21 DIAGNOSIS — I482 Chronic atrial fibrillation: Secondary | ICD-10-CM | POA: Diagnosis not present

## 2018-02-21 DIAGNOSIS — I4891 Unspecified atrial fibrillation: Secondary | ICD-10-CM | POA: Diagnosis not present

## 2018-02-21 DIAGNOSIS — E1165 Type 2 diabetes mellitus with hyperglycemia: Secondary | ICD-10-CM | POA: Diagnosis not present

## 2018-02-21 DIAGNOSIS — I6350 Cerebral infarction due to unspecified occlusion or stenosis of unspecified cerebral artery: Secondary | ICD-10-CM | POA: Diagnosis not present

## 2018-02-21 DIAGNOSIS — Z5181 Encounter for therapeutic drug level monitoring: Secondary | ICD-10-CM | POA: Diagnosis not present

## 2018-02-21 DIAGNOSIS — I10 Essential (primary) hypertension: Secondary | ICD-10-CM | POA: Diagnosis not present

## 2018-02-21 DIAGNOSIS — E7849 Other hyperlipidemia: Secondary | ICD-10-CM | POA: Diagnosis not present

## 2018-02-21 DIAGNOSIS — Z780 Asymptomatic menopausal state: Secondary | ICD-10-CM

## 2018-02-21 DIAGNOSIS — N189 Chronic kidney disease, unspecified: Secondary | ICD-10-CM | POA: Diagnosis not present

## 2018-02-21 DIAGNOSIS — I2510 Atherosclerotic heart disease of native coronary artery without angina pectoris: Secondary | ICD-10-CM | POA: Diagnosis not present

## 2018-02-21 LAB — POCT INR: INR: 2.5 (ref 2.0–3.0)

## 2018-02-21 NOTE — Patient Instructions (Signed)
Continue coumadin 1 tablet daily except 1/2 tablet on Mondays, Wednesdays and Fridays.  Recheck 4 weeks

## 2018-03-05 DIAGNOSIS — E119 Type 2 diabetes mellitus without complications: Secondary | ICD-10-CM | POA: Diagnosis not present

## 2018-03-05 DIAGNOSIS — E11319 Type 2 diabetes mellitus with unspecified diabetic retinopathy without macular edema: Secondary | ICD-10-CM | POA: Diagnosis not present

## 2018-03-05 DIAGNOSIS — E113293 Type 2 diabetes mellitus with mild nonproliferative diabetic retinopathy without macular edema, bilateral: Secondary | ICD-10-CM | POA: Diagnosis not present

## 2018-03-07 ENCOUNTER — Ambulatory Visit (HOSPITAL_COMMUNITY)
Admission: RE | Admit: 2018-03-07 | Discharge: 2018-03-07 | Disposition: A | Discharge status: Home / Self Care | Payer: Medicare HMO | Source: Ambulatory Visit | Attending: Internal Medicine | Admitting: Internal Medicine

## 2018-03-07 DIAGNOSIS — Z780 Asymptomatic menopausal state: Secondary | ICD-10-CM | POA: Insufficient documentation

## 2018-03-07 DIAGNOSIS — M85832 Other specified disorders of bone density and structure, left forearm: Secondary | ICD-10-CM | POA: Diagnosis not present

## 2018-03-07 DIAGNOSIS — M818 Other osteoporosis without current pathological fracture: Secondary | ICD-10-CM | POA: Insufficient documentation

## 2018-03-07 DIAGNOSIS — M810 Age-related osteoporosis without current pathological fracture: Secondary | ICD-10-CM | POA: Diagnosis not present

## 2018-03-21 ENCOUNTER — Ambulatory Visit (INDEPENDENT_AMBULATORY_CARE_PROVIDER_SITE_OTHER): Payer: Medicare HMO | Admitting: *Deleted

## 2018-03-21 DIAGNOSIS — Z5181 Encounter for therapeutic drug level monitoring: Secondary | ICD-10-CM | POA: Diagnosis not present

## 2018-03-21 DIAGNOSIS — I4891 Unspecified atrial fibrillation: Secondary | ICD-10-CM | POA: Diagnosis not present

## 2018-03-21 LAB — POCT INR: INR: 2.9 (ref 2.0–3.0)

## 2018-03-21 NOTE — Patient Instructions (Signed)
Continue coumadin 1 tablet daily except 1/2 tablet on Mondays, Wednesdays and Fridays.  Recheck 4 weeks

## 2018-03-31 ENCOUNTER — Other Ambulatory Visit: Payer: Self-pay | Admitting: Cardiology

## 2018-04-18 ENCOUNTER — Ambulatory Visit (INDEPENDENT_AMBULATORY_CARE_PROVIDER_SITE_OTHER): Payer: Medicare HMO | Admitting: *Deleted

## 2018-04-18 DIAGNOSIS — I4891 Unspecified atrial fibrillation: Secondary | ICD-10-CM

## 2018-04-18 DIAGNOSIS — Z5181 Encounter for therapeutic drug level monitoring: Secondary | ICD-10-CM

## 2018-04-18 LAB — POCT INR: INR: 3.2 — AB (ref 2.0–3.0)

## 2018-04-18 NOTE — Patient Instructions (Signed)
Hold coumadin tonight then resume 1 tablet daily except 1/2 tablet on Mondays, Wednesdays and Fridays.  Recheck 4 weeks

## 2018-05-16 ENCOUNTER — Ambulatory Visit (INDEPENDENT_AMBULATORY_CARE_PROVIDER_SITE_OTHER): Payer: Medicare HMO | Admitting: *Deleted

## 2018-05-16 DIAGNOSIS — I4891 Unspecified atrial fibrillation: Secondary | ICD-10-CM

## 2018-05-16 DIAGNOSIS — Z5181 Encounter for therapeutic drug level monitoring: Secondary | ICD-10-CM | POA: Diagnosis not present

## 2018-05-16 LAB — POCT INR: INR: 3.2 — AB (ref 2.0–3.0)

## 2018-05-16 NOTE — Patient Instructions (Signed)
Hold coumadin tonight then resume 1 tablet daily except 1/2 tablet on Mondays, Wednesdays and Fridays.  Increase greens  Recheck 4 weeks

## 2018-05-21 DIAGNOSIS — I2510 Atherosclerotic heart disease of native coronary artery without angina pectoris: Secondary | ICD-10-CM | POA: Diagnosis not present

## 2018-05-21 DIAGNOSIS — E1165 Type 2 diabetes mellitus with hyperglycemia: Secondary | ICD-10-CM | POA: Diagnosis not present

## 2018-05-21 DIAGNOSIS — I482 Chronic atrial fibrillation, unspecified: Secondary | ICD-10-CM | POA: Diagnosis not present

## 2018-05-21 DIAGNOSIS — I10 Essential (primary) hypertension: Secondary | ICD-10-CM | POA: Diagnosis not present

## 2018-06-13 ENCOUNTER — Ambulatory Visit (INDEPENDENT_AMBULATORY_CARE_PROVIDER_SITE_OTHER): Payer: Medicare HMO | Admitting: *Deleted

## 2018-06-13 DIAGNOSIS — Z5181 Encounter for therapeutic drug level monitoring: Secondary | ICD-10-CM

## 2018-06-13 DIAGNOSIS — I4891 Unspecified atrial fibrillation: Secondary | ICD-10-CM | POA: Diagnosis not present

## 2018-06-13 LAB — POCT INR: INR: 2.9 (ref 2.0–3.0)

## 2018-06-13 NOTE — Patient Instructions (Signed)
Continue coumadin 1 tablet daily except 1/2 tablet on Mondays, Wednesdays and Fridays.  Increase greens  Recheck 4 weeks

## 2018-07-16 ENCOUNTER — Ambulatory Visit (INDEPENDENT_AMBULATORY_CARE_PROVIDER_SITE_OTHER): Payer: Medicare HMO | Admitting: *Deleted

## 2018-07-16 DIAGNOSIS — Z5181 Encounter for therapeutic drug level monitoring: Secondary | ICD-10-CM | POA: Diagnosis not present

## 2018-07-16 DIAGNOSIS — I4891 Unspecified atrial fibrillation: Secondary | ICD-10-CM

## 2018-07-16 LAB — POCT INR: INR: 2.4 (ref 2.0–3.0)

## 2018-07-16 NOTE — Patient Instructions (Signed)
Continue coumadin 1 tablet daily except 1/2 tablet on Mondays, Wednesdays and Fridays.  Increase greens  Recheck 4 weeks

## 2018-08-13 ENCOUNTER — Ambulatory Visit (INDEPENDENT_AMBULATORY_CARE_PROVIDER_SITE_OTHER): Payer: Medicare HMO | Admitting: Pharmacist

## 2018-08-13 DIAGNOSIS — I4891 Unspecified atrial fibrillation: Secondary | ICD-10-CM | POA: Diagnosis not present

## 2018-08-13 DIAGNOSIS — Z5181 Encounter for therapeutic drug level monitoring: Secondary | ICD-10-CM

## 2018-08-13 LAB — POCT INR: INR: 2.4 (ref 2.0–3.0)

## 2018-08-13 NOTE — Patient Instructions (Signed)
Description   Continue coumadin 1 tablet daily except 1/2 tablet on Mondays, Wednesdays and Fridays.  Continue greens  Recheck 6 weeks

## 2018-08-20 DIAGNOSIS — I482 Chronic atrial fibrillation, unspecified: Secondary | ICD-10-CM | POA: Diagnosis not present

## 2018-08-20 DIAGNOSIS — I2510 Atherosclerotic heart disease of native coronary artery without angina pectoris: Secondary | ICD-10-CM | POA: Diagnosis not present

## 2018-08-20 DIAGNOSIS — I10 Essential (primary) hypertension: Secondary | ICD-10-CM | POA: Diagnosis not present

## 2018-08-20 DIAGNOSIS — E1165 Type 2 diabetes mellitus with hyperglycemia: Secondary | ICD-10-CM | POA: Diagnosis not present

## 2018-09-24 ENCOUNTER — Ambulatory Visit (INDEPENDENT_AMBULATORY_CARE_PROVIDER_SITE_OTHER): Payer: Medicare HMO | Admitting: *Deleted

## 2018-09-24 DIAGNOSIS — Z5181 Encounter for therapeutic drug level monitoring: Secondary | ICD-10-CM

## 2018-09-24 DIAGNOSIS — I4891 Unspecified atrial fibrillation: Secondary | ICD-10-CM | POA: Diagnosis not present

## 2018-09-24 LAB — POCT INR: INR: 1.7 — AB (ref 2.0–3.0)

## 2018-09-24 NOTE — Patient Instructions (Signed)
Take coumadin 1 tablet tonight then resume 1 tablet daily except 1/2 tablet on Mondays, Wednesdays and Fridays.  Continue greens  Recheck 4 weeks

## 2018-10-22 ENCOUNTER — Telehealth: Payer: Self-pay | Admitting: *Deleted

## 2018-10-22 NOTE — Telephone Encounter (Signed)
° ° ° °  COVID-19 Pre-Screening Questions: ° °• Do you currently have a fever? No °•  °• Have you recently travelled on a cruise, internationally, or to [place], [place], [place], [place], [place], or [place], [place] ([employer]) ? No °•  °• Have you been in contact with someone that is currently pending confirmation of Covid19 testing or has been confirmed to have the Covid19 virus? No °•  °• Are you currently experiencing fatigue or cough? No  ° ° °   ° ° ° ° ° °

## 2018-10-23 ENCOUNTER — Other Ambulatory Visit: Payer: Self-pay

## 2018-10-23 ENCOUNTER — Ambulatory Visit (INDEPENDENT_AMBULATORY_CARE_PROVIDER_SITE_OTHER): Payer: Medicare HMO | Admitting: *Deleted

## 2018-10-23 DIAGNOSIS — Z5181 Encounter for therapeutic drug level monitoring: Secondary | ICD-10-CM

## 2018-10-23 DIAGNOSIS — I4891 Unspecified atrial fibrillation: Secondary | ICD-10-CM

## 2018-10-23 LAB — POCT INR: INR: 2.1 (ref 2.0–3.0)

## 2018-10-23 NOTE — Patient Instructions (Signed)
Continue coumadin 1 tablet daily except 1/2 tablet on Mondays, Wednesdays and Fridays.  Continue greens  Recheck 4 weeks

## 2018-11-12 DIAGNOSIS — I10 Essential (primary) hypertension: Secondary | ICD-10-CM | POA: Diagnosis not present

## 2018-11-12 DIAGNOSIS — E1165 Type 2 diabetes mellitus with hyperglycemia: Secondary | ICD-10-CM | POA: Diagnosis not present

## 2018-11-12 DIAGNOSIS — I482 Chronic atrial fibrillation, unspecified: Secondary | ICD-10-CM | POA: Diagnosis not present

## 2018-11-12 DIAGNOSIS — I2510 Atherosclerotic heart disease of native coronary artery without angina pectoris: Secondary | ICD-10-CM | POA: Diagnosis not present

## 2018-11-20 ENCOUNTER — Ambulatory Visit (INDEPENDENT_AMBULATORY_CARE_PROVIDER_SITE_OTHER): Payer: Medicare HMO | Admitting: *Deleted

## 2018-11-20 ENCOUNTER — Other Ambulatory Visit: Payer: Self-pay

## 2018-11-20 DIAGNOSIS — I4891 Unspecified atrial fibrillation: Secondary | ICD-10-CM | POA: Diagnosis not present

## 2018-11-20 DIAGNOSIS — Z5181 Encounter for therapeutic drug level monitoring: Secondary | ICD-10-CM | POA: Diagnosis not present

## 2018-11-20 LAB — POCT INR: INR: 2.7 (ref 2.0–3.0)

## 2018-11-20 NOTE — Patient Instructions (Signed)
Continue coumadin 1 tablet daily except 1/2 tablet on Mondays, Wednesdays and Fridays.  Continue greens  Recheck in 5 weeks at Dr Harl Bowie appt.  Pt will come to [facility] if Branch appt becomes a virtual visit.

## 2018-12-19 ENCOUNTER — Encounter: Payer: Self-pay | Admitting: Cardiology

## 2018-12-19 ENCOUNTER — Other Ambulatory Visit: Payer: Self-pay

## 2018-12-19 ENCOUNTER — Ambulatory Visit: Payer: Medicare HMO | Admitting: Cardiology

## 2018-12-26 ENCOUNTER — Ambulatory Visit (INDEPENDENT_AMBULATORY_CARE_PROVIDER_SITE_OTHER): Payer: Medicare HMO | Admitting: *Deleted

## 2018-12-26 DIAGNOSIS — Z5181 Encounter for therapeutic drug level monitoring: Secondary | ICD-10-CM

## 2018-12-26 DIAGNOSIS — I4891 Unspecified atrial fibrillation: Secondary | ICD-10-CM

## 2018-12-26 LAB — POCT INR: INR: 2.4 (ref 2.0–3.0)

## 2018-12-26 NOTE — Patient Instructions (Signed)
Continue coumadin 1 tablet daily except 1/2 tablet on Mondays, Wednesdays and Fridays Continue greens Recheck in 6 weeks 

## 2019-01-07 DIAGNOSIS — I2510 Atherosclerotic heart disease of native coronary artery without angina pectoris: Secondary | ICD-10-CM | POA: Diagnosis not present

## 2019-01-07 DIAGNOSIS — E785 Hyperlipidemia, unspecified: Secondary | ICD-10-CM | POA: Diagnosis not present

## 2019-01-07 DIAGNOSIS — I4821 Permanent atrial fibrillation: Secondary | ICD-10-CM | POA: Diagnosis not present

## 2019-01-07 DIAGNOSIS — E1165 Type 2 diabetes mellitus with hyperglycemia: Secondary | ICD-10-CM | POA: Diagnosis not present

## 2019-02-06 ENCOUNTER — Ambulatory Visit (INDEPENDENT_AMBULATORY_CARE_PROVIDER_SITE_OTHER): Payer: Medicare HMO | Admitting: *Deleted

## 2019-02-06 DIAGNOSIS — I4891 Unspecified atrial fibrillation: Secondary | ICD-10-CM | POA: Diagnosis not present

## 2019-02-06 DIAGNOSIS — Z5181 Encounter for therapeutic drug level monitoring: Secondary | ICD-10-CM | POA: Diagnosis not present

## 2019-02-06 LAB — POCT INR: INR: 1.9 — AB (ref 2.0–3.0)

## 2019-02-06 NOTE — Patient Instructions (Signed)
Take coumadin 1 tablet tonight then resume 1 tablet daily except 1/2 tablet on Mondays, Wednesdays and Fridays Continue greens Recheck in 6 weeks  

## 2019-03-01 DIAGNOSIS — Z1331 Encounter for screening for depression: Secondary | ICD-10-CM | POA: Diagnosis not present

## 2019-03-01 DIAGNOSIS — Z1389 Encounter for screening for other disorder: Secondary | ICD-10-CM | POA: Diagnosis not present

## 2019-03-01 DIAGNOSIS — I2510 Atherosclerotic heart disease of native coronary artery without angina pectoris: Secondary | ICD-10-CM | POA: Diagnosis not present

## 2019-03-01 DIAGNOSIS — Z0001 Encounter for general adult medical examination with abnormal findings: Secondary | ICD-10-CM | POA: Diagnosis not present

## 2019-03-01 DIAGNOSIS — I10 Essential (primary) hypertension: Secondary | ICD-10-CM | POA: Diagnosis not present

## 2019-03-01 DIAGNOSIS — E1165 Type 2 diabetes mellitus with hyperglycemia: Secondary | ICD-10-CM | POA: Diagnosis not present

## 2019-03-20 ENCOUNTER — Other Ambulatory Visit: Payer: Self-pay

## 2019-03-20 ENCOUNTER — Ambulatory Visit (INDEPENDENT_AMBULATORY_CARE_PROVIDER_SITE_OTHER): Payer: Medicare HMO | Admitting: *Deleted

## 2019-03-20 DIAGNOSIS — I4891 Unspecified atrial fibrillation: Secondary | ICD-10-CM

## 2019-03-20 DIAGNOSIS — Z5181 Encounter for therapeutic drug level monitoring: Secondary | ICD-10-CM | POA: Diagnosis not present

## 2019-03-20 LAB — POCT INR: INR: 3 (ref 2.0–3.0)

## 2019-03-20 NOTE — Patient Instructions (Signed)
Continue coumadin 1 tablet daily except 1/2 tablet on Mondays, Wednesdays and Fridays Continue greens Recheck in 6 weeks 

## 2019-03-22 ENCOUNTER — Other Ambulatory Visit: Payer: Self-pay | Admitting: *Deleted

## 2019-03-22 DIAGNOSIS — Z20822 Contact with and (suspected) exposure to covid-19: Secondary | ICD-10-CM

## 2019-03-22 DIAGNOSIS — R6889 Other general symptoms and signs: Secondary | ICD-10-CM | POA: Diagnosis not present

## 2019-03-24 ENCOUNTER — Other Ambulatory Visit: Payer: Self-pay | Admitting: Cardiology

## 2019-03-24 LAB — NOVEL CORONAVIRUS, NAA: SARS-CoV-2, NAA: NOT DETECTED

## 2019-04-01 DIAGNOSIS — E1165 Type 2 diabetes mellitus with hyperglycemia: Secondary | ICD-10-CM | POA: Diagnosis not present

## 2019-04-01 DIAGNOSIS — I2510 Atherosclerotic heart disease of native coronary artery without angina pectoris: Secondary | ICD-10-CM | POA: Diagnosis not present

## 2019-04-21 DIAGNOSIS — I2510 Atherosclerotic heart disease of native coronary artery without angina pectoris: Secondary | ICD-10-CM | POA: Diagnosis not present

## 2019-04-21 DIAGNOSIS — E1165 Type 2 diabetes mellitus with hyperglycemia: Secondary | ICD-10-CM | POA: Diagnosis not present

## 2019-04-30 ENCOUNTER — Telehealth: Payer: Self-pay | Admitting: Pharmacist

## 2019-04-30 NOTE — Telephone Encounter (Signed)
Called pt to discuss changing from warfarin to [place] due to better efficacy and safety data, as well as less frequent monitoring, especially given COVID-19 pandemic. Spoke with pt and her daughter, they are both interested but wish to discuss first with her PCP Dr Legrand Rams. They will let us know if pt wishes to be changed to Eliquis.

## 2019-05-01 ENCOUNTER — Other Ambulatory Visit: Payer: Self-pay

## 2019-05-01 ENCOUNTER — Ambulatory Visit (INDEPENDENT_AMBULATORY_CARE_PROVIDER_SITE_OTHER): Payer: Medicare HMO | Admitting: *Deleted

## 2019-05-01 DIAGNOSIS — I4891 Unspecified atrial fibrillation: Secondary | ICD-10-CM

## 2019-05-01 DIAGNOSIS — Z5181 Encounter for therapeutic drug level monitoring: Secondary | ICD-10-CM | POA: Diagnosis not present

## 2019-05-01 LAB — POCT INR: INR: 3.4 — AB (ref 2.0–3.0)

## 2019-05-01 NOTE — Patient Instructions (Signed)
Hold warfarin tonight then resume 1 tablet daily except 1/2 tablet on Mondays, Wednesdays and Fridays.  Continue greens  Recheck in 6 weeks  Might be interested in Eliquis.  Pt will check with pharmacist to see how much it will cost in comparison to warfarin.

## 2019-05-09 DIAGNOSIS — Z23 Encounter for immunization: Secondary | ICD-10-CM | POA: Diagnosis not present

## 2019-05-22 DIAGNOSIS — E1165 Type 2 diabetes mellitus with hyperglycemia: Secondary | ICD-10-CM | POA: Diagnosis not present

## 2019-05-22 DIAGNOSIS — I2510 Atherosclerotic heart disease of native coronary artery without angina pectoris: Secondary | ICD-10-CM | POA: Diagnosis not present

## 2019-06-05 ENCOUNTER — Other Ambulatory Visit: Payer: Self-pay

## 2019-06-05 ENCOUNTER — Encounter: Payer: Self-pay | Admitting: Cardiology

## 2019-06-05 ENCOUNTER — Ambulatory Visit (INDEPENDENT_AMBULATORY_CARE_PROVIDER_SITE_OTHER): Payer: Medicare HMO | Admitting: Cardiology

## 2019-06-05 VITALS — BP 141/68 | HR 71 | Temp 96.8°F | Ht 62.5 in | Wt 160.0 lb

## 2019-06-05 DIAGNOSIS — I4891 Unspecified atrial fibrillation: Secondary | ICD-10-CM | POA: Diagnosis not present

## 2019-06-05 DIAGNOSIS — E782 Mixed hyperlipidemia: Secondary | ICD-10-CM

## 2019-06-05 DIAGNOSIS — I10 Essential (primary) hypertension: Secondary | ICD-10-CM | POA: Diagnosis not present

## 2019-06-05 NOTE — Progress Notes (Signed)
Clinical Summary Ms. Methvin is a 85 y.o.female seen today for follow up of the following medical problems.   1. Afib - not interested in NOACs due to cost.   - no palpitations - compliant with meds. No bleeding on coumadin.   2. HTN  - hast not taken meds yet today  3. Hyperlipidemia - she is compliant with statin  4. Hx of colon CA - prior right hemocolectomy - followed by GI    SH: husband is Verdie Shire, patient of mine.   Past Medical History:  Diagnosis Date  . Atrial fibrillation, chronic (HCC)    normal EF  . Cerebrovascular disease    Left cerebral CVA 2007; residual right sided weakness; 7/09 mild plaque; no focal stenosis  . Chronic anticoagulation   . Chronic kidney disease, stage 3, mod decreased GFR    Creatinine of 1.5 in 10/2008; h/o hyper- and hypokalemia  . Colon adenocarcinoma ([place]) 2009   s/p right hemicolectomy  . Diabetes mellitus, type 2 ([place])   . Dupuytren's contracture   . Hyperlipidemia   . Hypertension      No Known Allergies   Current Outpatient Medications  Medication Sig Dispense Refill  . amLODipine (NORVASC) 5 MG tablet Take 1 tablet (5 mg total) by mouth daily. 30 tablet 6  . glipiZIDE (GLUCOTROL XL) 2.5 MG 24 hr tablet Take 2.5 mg by mouth daily.      . hydrochlorothiazide (MICROZIDE) 12.5 MG capsule Take 12.5 mg by mouth daily.    . metFORMIN (GLUCOPHAGE) 500 MG tablet Take 500 mg by mouth 2 (two) times daily with a meal.      . metoprolol (LOPRESSOR) 50 MG tablet Take 50 mg by mouth 2 (two) times daily.      Marland Kitchen omeprazole (PRILOSEC) 20 MG capsule Take 20 mg by mouth daily.      . potassium chloride SA (K-DUR,KLOR-CON) 20 MEQ tablet TAKE 1 TABLET BY MOUTH EVERY DAY 30 tablet 0  . simvastatin (ZOCOR) 20 MG tablet Take 20 mg by mouth at bedtime.     Marland Kitchen warfarin (COUMADIN) 5 MG tablet TAKE 1 TABLET BY MOUTH DAILY EXCEPT TAKE 1/2 TABLET ON MONDAYS, WEDNESDAYS AND FRIDAYS 90 tablet 1   No current facility-administered  medications for this visit.      Past Surgical History:  Procedure Laterality Date  . APPENDECTOMY    . COLONOSCOPY  6/23/200   Rourk: Left diverticula, multiple polyps, adenocarcinoma of colon 8 cm distal to ICV  . COLONOSCOPY  07/02/2012   Procedure: COLONOSCOPY;  Surgeon: Daneil Dolin, MD;  Location: [facility]O [facility];  Service: Endoscopy;  Laterality: N/A;  9:30  . COLONOSCOPY W/ POLYPECTOMY  05/2009   Rourk: internal hemorrhoidal tag, site of piecemeal polypectomy in  August identified, residual polypectomy.  Multiple tubular adenomas/tubulovillous  . HEMICOLECTOMY  12/2007   Right, adenocarcinoma  . TUBAL LIGATION       No Known Allergies    Family History  Problem Relation Age of Onset  . Diabetes Mother   . Heart attack Father   . Lung cancer Son   . CAD Sister   . Cancer Brother   . CAD Sister   . COPD Brother   . Diabetes Brother      Social History Ms. Waldschmidt reports that she has never smoked. Her smokeless tobacco use includes snuff. Ms. Gagnard reports no history of alcohol use.   Review of Systems CONSTITUTIONAL: No weight loss, fever, chills, weakness  or fatigue.  HEENT: Eyes: No visual loss, blurred vision, double vision or yellow sclerae.No hearing loss, sneezing, congestion, runny nose or sore throat.  SKIN: No rash or itching.  CARDIOVASCULAR: per hpi RESPIRATORY: No shortness of breath, cough or sputum.  GASTROINTESTINAL: No anorexia, nausea, vomiting or diarrhea. No abdominal pain or blood.  GENITOURINARY: No burning on urination, no polyuria NEUROLOGICAL: No headache, dizziness, syncope, paralysis, ataxia, numbness or tingling in the extremities. No change in bowel or bladder control.  MUSCULOSKELETAL: No muscle, back pain, joint pain or stiffness.  LYMPHATICS: No enlarged nodes. No history of splenectomy.  PSYCHIATRIC: No history of depression or anxiety.  ENDOCRINOLOGIC: No reports of sweating, cold or heat intolerance. No polyuria or polydipsia.   Marland Kitchen   Physical Examination Vitals:   06/05/19 1037  BP: (!) 141/68  Pulse: 71  Temp: (!) 96.8 F (36 C)  SpO2: 98%   Filed Weights   06/05/19 1037  Weight: 160 lb (72.6 kg)    Gen: resting comfortably, no acute distress HEENT: no scleral icterus, pupils equal round and reactive, no palptable cervical adenopathy,  CV: irreg, no m/r/g, no jvd Resp: Clear to auscultation bilaterally GI: abdomen is soft, non-tender, non-distended, normal bowel sounds, no hepatosplenomegaly MSK: extremities are warm, no edema.  Skin: warm, no rash Neuro:  no focal deficits Psych: appropriate affect     Assessment and Plan  1. Afib -no recent symptoms, continue current meds. She has not been interested in NOACs  2. HTN - mildly elevated today but has not taken meds yet, continue to monitor  3. Hyperlipidemia - continue statin, request pcp labs   F?u 1 year      Arnoldo Lenis, M.D.

## 2019-06-05 NOTE — Patient Instructions (Signed)

## 2019-06-10 ENCOUNTER — Ambulatory Visit (INDEPENDENT_AMBULATORY_CARE_PROVIDER_SITE_OTHER): Payer: Medicare HMO | Admitting: *Deleted

## 2019-06-10 ENCOUNTER — Other Ambulatory Visit: Payer: Self-pay

## 2019-06-10 DIAGNOSIS — I4891 Unspecified atrial fibrillation: Secondary | ICD-10-CM

## 2019-06-10 DIAGNOSIS — Z5181 Encounter for therapeutic drug level monitoring: Secondary | ICD-10-CM

## 2019-06-10 LAB — POCT INR: INR: 2.8 (ref 2.0–3.0)

## 2019-06-10 NOTE — Patient Instructions (Signed)
Continue warfarin 1 tablet daily except 1/2 tablet on Mondays, Wednesdays and Fridays.  Continue greens  Recheck in 6 weeks  Talked with pt/daughter.  Copay for Eliquis will be $45/month.  Pt is not interested in switching at this time due to cost.

## 2019-06-21 DIAGNOSIS — E785 Hyperlipidemia, unspecified: Secondary | ICD-10-CM | POA: Diagnosis not present

## 2019-06-21 DIAGNOSIS — I2510 Atherosclerotic heart disease of native coronary artery without angina pectoris: Secondary | ICD-10-CM | POA: Diagnosis not present

## 2019-07-10 ENCOUNTER — Other Ambulatory Visit: Payer: Self-pay

## 2019-07-10 ENCOUNTER — Ambulatory Visit: Payer: Medicare HMO | Attending: Internal Medicine

## 2019-07-10 DIAGNOSIS — Z20828 Contact with and (suspected) exposure to other viral communicable diseases: Secondary | ICD-10-CM | POA: Diagnosis not present

## 2019-07-10 DIAGNOSIS — Z20822 Contact with and (suspected) exposure to covid-19: Secondary | ICD-10-CM

## 2019-07-11 LAB — NOVEL CORONAVIRUS, NAA: SARS-CoV-2, NAA: DETECTED — AB

## 2019-07-16 ENCOUNTER — Ambulatory Visit: Payer: Self-pay | Admitting: *Deleted

## 2019-07-16 NOTE — Telephone Encounter (Signed)
Pt's daughter calling, pt present. Reports covid positive. States has had diarrhea, severe for several days, last episode yesterday. States now with upper abdominal pain, constant, moderate. Reports urine dark, decreased urination. Has been drinking Pedialyte today. States mouth, skin dry. Directed to ED, advised to alert staff prior to entering, wear mask.  Reason for Disposition . [1] Pain lasts > 10 minutes AND [2] age > 27  Answer Assessment - Initial Assessment Questions 1. LOCATION: "Where does it hurt?"      Under rib cage 2. RADIATION: "Does the pain shoot anywhere else?" (e.g., chest, back)     stomach 3. ONSET: "When did the pain begin?" (e.g., minutes, hours or days ago)      yesterday 4. SUDDEN: "Gradual or sudden onset?"      5. PATTERN "Does the pain come and go, or is it constant?"    - If constant: "Is it getting better, staying the same, or worsening?"      (Note: Constant means the pain never goes away completely; most serious pain is constant and it progresses)     - If intermittent: "How long does it last?" "Do you have pain now?"     (Note: Intermittent means the pain goes away completely between bouts)     constant 6. SEVERITY: "How bad is the pain?"  (e.g., Scale 1-10; mild, moderate, or severe)    - MILD (1-3): doesn't interfere with normal activities, abdomen soft and not tender to touch     - MODERATE (4-7): interferes with normal activities or awakens from sleep, tender to touch     - SEVERE (8-10): excruciating pain, doubled over, unable to do any normal activities       moderate 7. RECURRENT SYMPTOM: "Have you ever had this type of abdominal pain before?" If so, ask: "When was the last time?" and "What happened that time?"      Yes when dehydrated 8. AGGRAVATING FACTORS: "Does anything seem to cause this pain?" (e.g., foods, stress, alcohol)    no 9. CARDIAC SYMPTOMS: "Do you have any of the following symptoms: chest pain, difficulty breathing, sweating,  nausea?"     no 10. OTHER SYMPTOMS: "Do you have any other symptoms?" (e.g., fever, vomiting, diarrhea)      Diarrhea until yesterday, covid positive, urine dark, urinating less  Protocols used: ABDOMINAL PAIN - UPPER-A-AH

## 2019-07-22 ENCOUNTER — Inpatient Hospital Stay (HOSPITAL_COMMUNITY)
Admission: EM | Admit: 2019-07-22 | Discharge: 2019-08-19 | DRG: 308 | Disposition: E | Payer: Medicare HMO | Attending: Internal Medicine | Admitting: Internal Medicine

## 2019-07-22 ENCOUNTER — Emergency Department (HOSPITAL_COMMUNITY): Payer: Medicare HMO

## 2019-07-22 ENCOUNTER — Other Ambulatory Visit: Payer: Self-pay

## 2019-07-22 ENCOUNTER — Encounter (HOSPITAL_COMMUNITY): Payer: Self-pay | Admitting: Emergency Medicine

## 2019-07-22 ENCOUNTER — Inpatient Hospital Stay (HOSPITAL_COMMUNITY): Payer: Medicare HMO

## 2019-07-22 DIAGNOSIS — Z801 Family history of malignant neoplasm of trachea, bronchus and lung: Secondary | ICD-10-CM

## 2019-07-22 DIAGNOSIS — Z66 Do not resuscitate: Secondary | ICD-10-CM | POA: Diagnosis not present

## 2019-07-22 DIAGNOSIS — N183 Chronic kidney disease, stage 3 unspecified: Secondary | ICD-10-CM | POA: Diagnosis not present

## 2019-07-22 DIAGNOSIS — I482 Chronic atrial fibrillation, unspecified: Secondary | ICD-10-CM

## 2019-07-22 DIAGNOSIS — D696 Thrombocytopenia, unspecified: Secondary | ICD-10-CM | POA: Diagnosis present

## 2019-07-22 DIAGNOSIS — Z8249 Family history of ischemic heart disease and other diseases of the circulatory system: Secondary | ICD-10-CM | POA: Diagnosis not present

## 2019-07-22 DIAGNOSIS — R7401 Elevation of levels of liver transaminase levels: Secondary | ICD-10-CM | POA: Diagnosis not present

## 2019-07-22 DIAGNOSIS — E861 Hypovolemia: Secondary | ICD-10-CM | POA: Diagnosis not present

## 2019-07-22 DIAGNOSIS — R0602 Shortness of breath: Secondary | ICD-10-CM | POA: Diagnosis not present

## 2019-07-22 DIAGNOSIS — I4819 Other persistent atrial fibrillation: Secondary | ICD-10-CM | POA: Diagnosis not present

## 2019-07-22 DIAGNOSIS — U071 COVID-19: Secondary | ICD-10-CM

## 2019-07-22 DIAGNOSIS — Z833 Family history of diabetes mellitus: Secondary | ICD-10-CM

## 2019-07-22 DIAGNOSIS — I083 Combined rheumatic disorders of mitral, aortic and tricuspid valves: Secondary | ICD-10-CM | POA: Diagnosis present

## 2019-07-22 DIAGNOSIS — D689 Coagulation defect, unspecified: Secondary | ICD-10-CM | POA: Diagnosis present

## 2019-07-22 DIAGNOSIS — Z825 Family history of asthma and other chronic lower respiratory diseases: Secondary | ICD-10-CM

## 2019-07-22 DIAGNOSIS — I209 Angina pectoris, unspecified: Secondary | ICD-10-CM | POA: Diagnosis present

## 2019-07-22 DIAGNOSIS — I679 Cerebrovascular disease, unspecified: Secondary | ICD-10-CM | POA: Diagnosis not present

## 2019-07-22 DIAGNOSIS — I130 Hypertensive heart and chronic kidney disease with heart failure and stage 1 through stage 4 chronic kidney disease, or unspecified chronic kidney disease: Secondary | ICD-10-CM | POA: Diagnosis present

## 2019-07-22 DIAGNOSIS — D62 Acute posthemorrhagic anemia: Secondary | ICD-10-CM | POA: Diagnosis present

## 2019-07-22 DIAGNOSIS — R9431 Abnormal electrocardiogram [ECG] [EKG]: Secondary | ICD-10-CM | POA: Diagnosis not present

## 2019-07-22 DIAGNOSIS — I4811 Longstanding persistent atrial fibrillation: Secondary | ICD-10-CM | POA: Diagnosis not present

## 2019-07-22 DIAGNOSIS — I10 Essential (primary) hypertension: Secondary | ICD-10-CM | POA: Diagnosis not present

## 2019-07-22 DIAGNOSIS — Z8616 Personal history of COVID-19: Secondary | ICD-10-CM

## 2019-07-22 DIAGNOSIS — E785 Hyperlipidemia, unspecified: Secondary | ICD-10-CM | POA: Diagnosis present

## 2019-07-22 DIAGNOSIS — Z7901 Long term (current) use of anticoagulants: Secondary | ICD-10-CM | POA: Diagnosis not present

## 2019-07-22 DIAGNOSIS — N179 Acute kidney failure, unspecified: Secondary | ICD-10-CM | POA: Diagnosis present

## 2019-07-22 DIAGNOSIS — R001 Bradycardia, unspecified: Secondary | ICD-10-CM | POA: Diagnosis not present

## 2019-07-22 DIAGNOSIS — E1122 Type 2 diabetes mellitus with diabetic chronic kidney disease: Secondary | ICD-10-CM | POA: Diagnosis present

## 2019-07-22 DIAGNOSIS — Z515 Encounter for palliative care: Secondary | ICD-10-CM | POA: Diagnosis not present

## 2019-07-22 DIAGNOSIS — I340 Nonrheumatic mitral (valve) insufficiency: Secondary | ICD-10-CM | POA: Diagnosis not present

## 2019-07-22 DIAGNOSIS — J069 Acute upper respiratory infection, unspecified: Secondary | ICD-10-CM | POA: Diagnosis not present

## 2019-07-22 DIAGNOSIS — I4891 Unspecified atrial fibrillation: Secondary | ICD-10-CM | POA: Diagnosis present

## 2019-07-22 DIAGNOSIS — F17290 Nicotine dependence, other tobacco product, uncomplicated: Secondary | ICD-10-CM | POA: Diagnosis present

## 2019-07-22 DIAGNOSIS — I214 Non-ST elevation (NSTEMI) myocardial infarction: Secondary | ICD-10-CM

## 2019-07-22 DIAGNOSIS — E875 Hyperkalemia: Secondary | ICD-10-CM | POA: Diagnosis not present

## 2019-07-22 DIAGNOSIS — I4821 Permanent atrial fibrillation: Secondary | ICD-10-CM | POA: Diagnosis not present

## 2019-07-22 DIAGNOSIS — R778 Other specified abnormalities of plasma proteins: Secondary | ICD-10-CM | POA: Diagnosis present

## 2019-07-22 DIAGNOSIS — N1832 Chronic kidney disease, stage 3b: Secondary | ICD-10-CM | POA: Diagnosis not present

## 2019-07-22 DIAGNOSIS — R680 Hypothermia, not associated with low environmental temperature: Secondary | ICD-10-CM | POA: Diagnosis not present

## 2019-07-22 DIAGNOSIS — Z9049 Acquired absence of other specified parts of digestive tract: Secondary | ICD-10-CM | POA: Diagnosis not present

## 2019-07-22 DIAGNOSIS — Z79899 Other long term (current) drug therapy: Secondary | ICD-10-CM

## 2019-07-22 DIAGNOSIS — I69351 Hemiplegia and hemiparesis following cerebral infarction affecting right dominant side: Secondary | ICD-10-CM | POA: Diagnosis not present

## 2019-07-22 DIAGNOSIS — E1121 Type 2 diabetes mellitus with diabetic nephropathy: Secondary | ICD-10-CM | POA: Diagnosis not present

## 2019-07-22 DIAGNOSIS — I361 Nonrheumatic tricuspid (valve) insufficiency: Secondary | ICD-10-CM | POA: Diagnosis not present

## 2019-07-22 DIAGNOSIS — R570 Cardiogenic shock: Secondary | ICD-10-CM | POA: Diagnosis not present

## 2019-07-22 DIAGNOSIS — I50810 Right heart failure, unspecified: Secondary | ICD-10-CM | POA: Diagnosis present

## 2019-07-22 DIAGNOSIS — I200 Unstable angina: Secondary | ICD-10-CM | POA: Diagnosis not present

## 2019-07-22 DIAGNOSIS — Z7984 Long term (current) use of oral hypoglycemic drugs: Secondary | ICD-10-CM

## 2019-07-22 DIAGNOSIS — J9601 Acute respiratory failure with hypoxia: Secondary | ICD-10-CM | POA: Diagnosis not present

## 2019-07-22 DIAGNOSIS — D631 Anemia in chronic kidney disease: Secondary | ICD-10-CM | POA: Diagnosis not present

## 2019-07-22 DIAGNOSIS — G9341 Metabolic encephalopathy: Secondary | ICD-10-CM | POA: Diagnosis not present

## 2019-07-22 DIAGNOSIS — E669 Obesity, unspecified: Secondary | ICD-10-CM | POA: Diagnosis present

## 2019-07-22 DIAGNOSIS — I272 Pulmonary hypertension, unspecified: Secondary | ICD-10-CM | POA: Diagnosis present

## 2019-07-22 DIAGNOSIS — Z6833 Body mass index (BMI) 33.0-33.9, adult: Secondary | ICD-10-CM

## 2019-07-22 DIAGNOSIS — E871 Hypo-osmolality and hyponatremia: Secondary | ICD-10-CM | POA: Diagnosis not present

## 2019-07-22 DIAGNOSIS — Z20822 Contact with and (suspected) exposure to covid-19: Secondary | ICD-10-CM | POA: Diagnosis not present

## 2019-07-22 DIAGNOSIS — E872 Acidosis: Secondary | ICD-10-CM | POA: Diagnosis not present

## 2019-07-22 DIAGNOSIS — I959 Hypotension, unspecified: Secondary | ICD-10-CM

## 2019-07-22 DIAGNOSIS — Z85038 Personal history of other malignant neoplasm of large intestine: Secondary | ICD-10-CM | POA: Diagnosis not present

## 2019-07-22 DIAGNOSIS — I129 Hypertensive chronic kidney disease with stage 1 through stage 4 chronic kidney disease, or unspecified chronic kidney disease: Secondary | ICD-10-CM | POA: Diagnosis not present

## 2019-07-22 DIAGNOSIS — D72829 Elevated white blood cell count, unspecified: Secondary | ICD-10-CM | POA: Diagnosis present

## 2019-07-22 DIAGNOSIS — T68XXXA Hypothermia, initial encounter: Secondary | ICD-10-CM

## 2019-07-22 LAB — CBC WITH DIFFERENTIAL/PLATELET
Abs Immature Granulocytes: 0.11 10*3/uL — ABNORMAL HIGH (ref 0.00–0.07)
Basophils Absolute: 0 10*3/uL (ref 0.0–0.1)
Basophils Relative: 0 %
Eosinophils Absolute: 0 10*3/uL (ref 0.0–0.5)
Eosinophils Relative: 0 %
HCT: 37.4 % (ref 36.0–46.0)
Hemoglobin: 12.2 g/dL (ref 12.0–15.0)
Immature Granulocytes: 1 %
Lymphocytes Relative: 19 %
Lymphs Abs: 1.6 10*3/uL (ref 0.7–4.0)
MCH: 28.2 pg (ref 26.0–34.0)
MCHC: 32.6 g/dL (ref 30.0–36.0)
MCV: 86.4 fL (ref 80.0–100.0)
Monocytes Absolute: 0.6 10*3/uL (ref 0.1–1.0)
Monocytes Relative: 8 %
Neutro Abs: 5.7 10*3/uL (ref 1.7–7.7)
Neutrophils Relative %: 72 %
Platelets: 364 10*3/uL (ref 150–400)
RBC: 4.33 MIL/uL (ref 3.87–5.11)
RDW: 13.6 % (ref 11.5–15.5)
WBC: 8.1 10*3/uL (ref 4.0–10.5)
nRBC: 0 % (ref 0.0–0.2)

## 2019-07-22 LAB — COMPREHENSIVE METABOLIC PANEL
ALT: 13 U/L (ref 0–44)
AST: 28 U/L (ref 15–41)
Albumin: 3.4 g/dL — ABNORMAL LOW (ref 3.5–5.0)
Alkaline Phosphatase: 83 U/L (ref 38–126)
Anion gap: 13 (ref 5–15)
BUN: 16 mg/dL (ref 8–23)
CO2: 20 mmol/L — ABNORMAL LOW (ref 22–32)
Calcium: 9 mg/dL (ref 8.9–10.3)
Chloride: 96 mmol/L — ABNORMAL LOW (ref 98–111)
Creatinine, Ser: 1.66 mg/dL — ABNORMAL HIGH (ref 0.44–1.00)
GFR calc Af Amer: 32 mL/min — ABNORMAL LOW (ref >60)
GFR calc non Af Amer: 28 mL/min — ABNORMAL LOW (ref >60)
Glucose, Bld: 218 mg/dL — ABNORMAL HIGH (ref 70–99)
Potassium: 4.8 mmol/L (ref 3.5–5.1)
Sodium: 129 mmol/L — ABNORMAL LOW (ref 135–145)
Total Bilirubin: 0.6 mg/dL (ref 0.3–1.2)
Total Protein: 7.8 g/dL (ref 6.5–8.1)

## 2019-07-22 LAB — ECHOCARDIOGRAM COMPLETE
Height: 62 in
Weight: 2592 [oz_av]

## 2019-07-22 LAB — TROPONIN I (HIGH SENSITIVITY)
Troponin I (High Sensitivity): 578 ng/L (ref <18)
Troponin I (High Sensitivity): 506 ng/L (ref <18)
Troponin I (High Sensitivity): 325 ng/L (ref <18)

## 2019-07-22 LAB — RESPIRATORY PANEL BY RT PCR (FLU A&B, COVID)
Influenza A by PCR: NEGATIVE
Influenza B by PCR: NEGATIVE
SARS Coronavirus 2 by RT PCR: NEGATIVE

## 2019-07-22 LAB — ABO/RH: ABO/RH(D): B POS

## 2019-07-22 LAB — PROTIME-INR
INR: 3.3 — ABNORMAL HIGH (ref 0.8–1.2)
Prothrombin Time: 33.2 s — ABNORMAL HIGH (ref 11.4–15.2)

## 2019-07-22 LAB — BRAIN NATRIURETIC PEPTIDE: B Natriuretic Peptide: 259 pg/mL — ABNORMAL HIGH (ref 0.0–100.0)

## 2019-07-22 MED ORDER — HEPARIN SODIUM (PORCINE) 5000 UNIT/ML IJ SOLN: 5000.0000 [IU] | Freq: Three times a day (TID) | INTRAMUSCULAR | Status: DC

## 2019-07-22 MED ORDER — ADULT MULTIVITAMIN W/MINERALS CH
1.0000 | ORAL_TABLET | Freq: Every day | ORAL | Status: DC
  Administered 2019-07-22 – 2019-07-29 (×7): 1 via ORAL
  Filled 2019-07-22 (×7): qty 1

## 2019-07-22 MED ORDER — ZINC SULFATE 220 (50 ZN) MG PO CAPS
220.0000 mg | ORAL_CAPSULE | Freq: Every day | ORAL | Status: DC
  Administered 2019-07-22 – 2019-07-29 (×7): 220 mg via ORAL
  Filled 2019-07-22 (×7): qty 1

## 2019-07-22 MED ORDER — ASCORBIC ACID 500 MG PO TABS
500.0000 mg | ORAL_TABLET | Freq: Every day | ORAL | Status: DC
  Administered 2019-07-22 – 2019-07-29 (×7): 500 mg via ORAL
  Filled 2019-07-22 (×7): qty 1

## 2019-07-22 MED ORDER — HYDROCOD POLST-CPM POLST ER 10-8 MG/5ML PO SUER: 5.0000 mL | Freq: Two times a day (BID) | ORAL | Status: DC | PRN

## 2019-07-22 MED ORDER — SIMVASTATIN 20 MG PO TABS
20.0000 mg | ORAL_TABLET | Freq: Every day | ORAL | Status: DC
  Administered 2019-07-22: 20 mg via ORAL
  Filled 2019-07-22: qty 1

## 2019-07-22 MED ORDER — GUAIFENESIN-DM 100-10 MG/5ML PO SYRP
10.0000 mL | ORAL_SOLUTION | ORAL | Status: DC | PRN
  Administered 2019-07-24 – 2019-07-28 (×2): 10 mL via ORAL
  Filled 2019-07-22 (×4): qty 10

## 2019-07-22 MED ORDER — ALBUTEROL SULFATE HFA 108 (90 BASE) MCG/ACT IN AERS
2.0000 | INHALATION_SPRAY | Freq: Four times a day (QID) | RESPIRATORY_TRACT | Status: DC
  Administered 2019-07-22 – 2019-07-26 (×15): 2 via RESPIRATORY_TRACT
  Filled 2019-07-22: qty 6.7

## 2019-07-22 NOTE — Progress Notes (Signed)
*  PRELIMINARY RESULTS* Echocardiogram 2D Echocardiogram has been performed.  Samuel Germany 07/26/2019, 1:20 PM

## 2019-07-22 NOTE — Progress Notes (Signed)
CRITICAL VALUE ALERT  Critical Value:  Troponin 325  Date & Time Notied:  07/21/2018 at  2255  Provider Notified: Tylene Fantasia midlevel  Orders Received/Actions taken:

## 2019-07-22 NOTE — H&P (Signed)
Patient Demographics:    Katrina Arnold, is a 86 y.o. female  MRN: [id number]   DOB - 1933-01-30  Admit Date - 07/27/2019  Outpatient Primary MD for the patient is Rosita Fire, MD   Assessment & Plan:    Principal Problem:   Atrial fibrillation [facility]) Active Problems:   Acute respiratory disease due to COVID-19 virus   Hypertension   CEREBROVASCULAR DISEASE   Chronic anticoagulation   Type 2 diabetes mellitus with stage 3 chronic kidney disease (HCC)    1) chronic atrial fibrillation--patient with bradycardia/slow ventricular response--  on Coumadin for anticoagulation, - INR 3.3 -Hold Coumadin for potential LHC, hold metoprolol due to bradycardia  2) chest pain/dyspnea--- in the setting of bradycardia with heart rate in the 20s-----echo with regional wall motion normalities, EF is 60 to 65%  -Cardiology consult appreciated, possible ACS, possible COVID-19 infection induced myocarditis with cardiomyopathy -Serial troponin 578 repeat 506, BNP 259 -EKG without acute findings -Cardiology consult appreciated -Metoprolol hold due to bradycardia -N.p.o. overnight for possible LHC in a.m. to be determined by cardiology service  3) COVID-19 infection---patient tested positive for COVID-19 along with her husband and daughter who live with her on 07/10/2019-- -no fevers  -repeat COVID-19 test on 08/08/2019 - -inflammatory markers ordered and pending, chest x-ray without pneumonia, WBC 8.1 -No hypoxia, does not qualify for steroids or remdesivir at this time  4)AKI Vs progression of  CKD stage - III--    creatinine on admission= 1.66 ,   no recent baseline creatinine available, -Last available creatinine was 1.3  from 12/04/2017     , renally adjust medications, avoid nephrotoxic agents / dehydration /  hypotension  5)HTN--hold HCTZ, continue amlodipine  6)HLD-continue simvastatin  7)DM2-recent A1c, hold Metformin, hold glipizide, Use Novolog/Humalog Sliding scale insulin with Accu-Cheks/Fingersticks as ordered    With History of - Reviewed by me  Past Medical History:  Diagnosis Date  . Atrial fibrillation, chronic ([place])   . Cerebrovascular disease    Left cerebral CVA 2007; residual right sided weakness; 7/09 mild plaque; no focal stenosis  . Chronic anticoagulation   . Chronic kidney disease, stage 3, mod decreased GFR    Creatinine of 1.5 in 10/2008; h/o hyper- and hypokalemia  . Colon adenocarcinoma ([place]) 2009   s/p right hemicolectomy  . Diabetes mellitus, type 2 ([place])   . Dupuytren's contracture   . Hyperlipidemia   . Hypertension       Past Surgical History:  Procedure Laterality Date  . APPENDECTOMY    . COLONOSCOPY  6/23/200   [facility]: Left diverticula, multiple polyps, adenocarcinoma of colon 8 cm distal to ICV  . COLONOSCOPY  07/02/2012   Procedure: COLONOSCOPY;  Surgeon: Daneil Dolin, MD;  Location: [facility];  Service: Endoscopy;  Laterality: N/A;  9:30  . COLONOSCOPY W/ POLYPECTOMY  05/2009   [place]: internal hemorrhoidal tag, site of piecemeal polypectomy in  August identified, residual polypectomy.  Multiple tubular  adenomas/tubulovillous  . HEMICOLECTOMY  12/2007   Right, adenocarcinoma  . TUBAL LIGATION        Chief Complaint  Patient presents with  . Shortness of Breath      HPI:    Katrina Arnold  is a 86 y.o. female With past medical history relevant for chronic atrial fibrillation currently on Coumadin and metoprolol as well as history of HTN, HLD who recently on 07/10/2019 was diagnosed with COVID-19 infection along with her husband and daughter at the lives with them presents today with shortness of breath and chest discomfort and is found to have A. fib with slow ventricular response with heart rate in the 20s  -With regard to COVID-19  infection patient complains of fatigue nausea, malaise but no high fevers - -No leg pains no leg swelling no pleuritic symptoms -In the ED despite subjective dyspnea and O2 sats is 100% on room air and patient is able to talk in full sentences patient is severely bradycardic  -Patient also complains of rhinorrhea and muscle aches and pain/cramping -No significant cough or sore throat  -Due to persistent bradycardia EDP requested cardiology consult -EKG with A. fib with slow ventricular response, chemistry remarkable for sodium of 129, chloride of 96, bicarb is low at 20 glucose is elevated at 218 with creatinine of 1.66 -CBC is unremarkable -Chest x-ray without acute findings -elevated troponin noted with initial troponin of 578, repeat troponin 506, BNP is 259 -INR is stable at 3.3  Review of systems:    In addition to the HPI above,   A full Review of  Systems was done, all other systems reviewed are negative except as noted above in HPI , .    Social History:  Reviewed by me    Social History   Tobacco Use  . Smoking status: Never Smoker  . Smokeless tobacco: Current User    Types: Snuff  Substance Use Topics  . Alcohol use: No    Alcohol/week: 0.0 standard drinks    Family History :  Reviewed by me    Family History  Problem Relation Age of Onset  . Diabetes Mother   . Heart attack Father   . Lung cancer Son   . CAD Sister   . Cancer Brother   . CAD Sister   . COPD Brother   . Diabetes Brother     Home Medications:   Prior to Admission medications   Medication Sig Start Date End Date Taking? Authorizing Provider  amLODipine (NORVASC) 5 MG tablet Take 1 tablet (5 mg total) by mouth daily. 09/25/13  Yes BranchAlphonse Guild, MD  glipiZIDE (GLUCOTROL XL) 2.5 MG 24 hr tablet Take 2.5 mg by mouth daily.     Yes [provider]  hydrochlorothiazide (MICROZIDE) 12.5 MG capsule Take 12.5 mg by mouth daily.   Yes [provider]  metFORMIN  (GLUCOPHAGE) 500 MG tablet Take 500 mg by mouth 2 (two) times daily with a meal.     Yes [provider]  metoprolol (LOPRESSOR) 50 MG tablet Take 50 mg by mouth 2 (two) times daily.     Yes [provider]  omeprazole (PRILOSEC) 20 MG capsule Take 20 mg by mouth daily.     Yes [provider]  potassium chloride SA (K-DUR,KLOR-CON) 20 MEQ tablet TAKE 1 TABLET BY MOUTH EVERY DAY Patient taking differently: Take 20 mEq by mouth daily.  08/18/11  Yes Wall, Marijo Conception, MD  simvastatin (ZOCOR) 20 MG tablet Take 20  mg by mouth at bedtime.    Yes [provider]  warfarin (COUMADIN) 5 MG tablet TAKE 1 TABLET BY MOUTH DAILY EXCEPT TAKE 1/2 TABLET ON MONDAYS, [place] Patient taking differently: Take 5 mg by mouth See admin instructions. TAKE 2.5 MG ON MONDAYS, WEDNESDAYS AND FRIDAYS. TAKE 5 MG ON OTHER DAYS 03/26/19  Yes Branch, Alphonse Guild, MD     Allergies:    No Known Allergies   Physical Exam:   Vitals  Blood pressure (!) 118/40, pulse (!) 41, temperature 98.1 F (36.7 C), resp. rate 17, height 5\' 2"  (1.575 m), weight 73.5 kg, SpO2 100 %.  Physical Examination: General appearance - alert, well appearing, and in no distress  Mental status - alert, oriented to person, place, and time,  Eyes - sclera anicteric Neck - supple, no JVD elevation , Chest - clear  to auscultation bilaterally, symmetrical air movement,  Heart - S1 and S2 irregularly irregular, bradycardic with heart rate around 30  abdomen - soft, nontender, nondistended, no masses or organomegaly, prior laparotomy scars Neurological - screening mental status exam normal, neck supple without rigidity, cranial nerves II through XII intact, DTR's normal and symmetric Extremities - no pedal edema noted, intact peripheral pulses  Skin - warm, dry     Data Review:    CBC Recent Labs  Lab 08/16/2019 0559  WBC 8.1  HGB 12.2  HCT 37.4  PLT 364  MCV 86.4  MCH 28.2  MCHC 32.6  RDW  13.6  LYMPHSABS 1.6  MONOABS 0.6  EOSABS 0.0  BASOSABS 0.0   ------------------------------------------------------------------------------------------------------------------  Chemistries  Recent Labs  Lab 08/12/2019 0559  NA 129*  K 4.8  CL 96*  CO2 20*  GLUCOSE 218*  BUN 16  CREATININE 1.66*  CALCIUM 9.0  AST 28  ALT 13  ALKPHOS 83  BILITOT 0.6   ------------------------------------------------------------------------------------------------------------------ estimated creatinine clearance is 23.7 mL/min (A) (by C-G formula based on SCr of 1.66 mg/dL (H)). ------------------------------------------------------------------------------------------------------------------ No results for input(s): TSH, T4TOTAL, T3FREE, THYROIDAB in the last 72 hours.  Invalid input(s): FREET3   Coagulation profile Recent Labs  Lab 08/15/2019 1107  INR 3.3*   ------------------------------------------------------------------------------------------------------------------- No results for input(s): DDIMER in the last 72 hours. -------------------------------------------------------------------------------------------------------------------  Cardiac Enzymes No results for input(s): CKMB, TROPONINI, MYOGLOBIN in the last 168 hours.  Invalid input(s): CK ------------------------------------------------------------------------------------------------------------------    Component Value Date/Time   BNP 259.0 (H) 08/04/2019 0559     ---------------------------------------------------------------------------------------------------------------  Urinalysis    Component Value Date/Time   COLORURINE YELLOW 04/23/2017 0049   APPEARANCEUR HAZY (A) 04/23/2017 0049   LABSPEC 1.021 04/23/2017 0049   PHURINE 5.0 04/23/2017 0049   GLUCOSEU NEGATIVE 04/23/2017 0049   GLUCOSEU NEG mg/dL 07/06/2009 1903   HGBUR MODERATE (A) 04/23/2017 0049   BILIRUBINUR NEGATIVE 04/23/2017 0049   KETONESUR  NEGATIVE 04/23/2017 0049   PROTEINUR 100 (A) 04/23/2017 0049   UROBILINOGEN 1 07/06/2009 1903   NITRITE NEGATIVE 04/23/2017 0049   LEUKOCYTESUR NEGATIVE 04/23/2017 0049    ----------------------------------------------------------------------------------------------------------------   Imaging Results:    DG Chest Port 1 View  Result Date: 08/13/2019 CLINICAL DATA:  Vomiting and shortness of breath. EXAM: PORTABLE CHEST 1 VIEW COMPARISON:  01/09/2008 FINDINGS: Normal heart size and mediastinal contours. There is no edema, consolidation, effusion, or pneumothorax. No osseous findings. IMPRESSION: No evidence of active disease. Electronically Signed   By: Monte Fantasia M.D.   On: 07/25/2019 06:03   ECHOCARDIOGRAM COMPLETE  Result Date: 08/14/2019   ECHOCARDIOGRAM REPORT  Patient Name:   Katrina Arnold Date of Exam: 08/12/2019 Medical Rec #:  [id number]    Height:       62.0 in Accession #:    [id number]   Weight:       162.0 lb Date of Birth:  Feb 18, 1933    BSA:          1.75 m Patient Age:    31 years     BP:           144/67 mmHg Patient Gender: F            HR:           40 bpm. Exam Location:  [facility] Procedure: 2D Echo, Cardiac Doppler and Color Doppler Indications:    Chest Pain 786.50 / R07.9  History:        Patient has prior history of Echocardiogram examinations, most                 recent 01/14/2008. Arrythmias:Atrial Fibrillation; Risk                 Factors:Hypertension, Diabetes and Dyslipidemia. Chronic                 anticoagulation,Hx of Adenocarcinoma of colon.  Sonographer:    BW Referring Phys: [place]  1. Left ventricular ejection fraction, by visual estimation, is 60 to 65%. The left ventricle has normal function. There is mildly increased left ventricular hypertrophy.  2. Left ventricular diastolic parameters are indeterminate.  3. The left ventricle demonstrates regional wall motion abnormalities. Apparent inferior basal akinesis.  4. Global right  ventricle has normal systolic function.The right ventricular size is normal. No increase in right ventricular wall thickness.  5. Left atrial size was severely dilated.  6. Right atrial size was moderately dilated.  7. The mitral valve is grossly normal. Moderate mitral valve regurgitation.  8. The tricuspid valve is grossly normal.  9. The aortic valve is tricuspid. Aortic valve regurgitation is mild. 10. The pulmonic valve was grossly normal. Pulmonic valve regurgitation is trivial. 11. Moderately elevated pulmonary artery systolic pressure. 12. The inferior vena cava is normal in size with greater than 50% respiratory variability, suggesting right atrial pressure of 3 mmHg. 13. Evidence of atrial level shunting detected by color flow Doppler. 14. The tricuspid regurgitant velocity is 3.23 m/s, and with an assumed right atrial pressure of 3 mmHg, the estimated right ventricular systolic pressure is moderately elevated at 44.7 mmHg. FINDINGS  Left Ventricle: Left ventricular ejection fraction, by visual estimation, is 60 to 65%. The left ventricle has normal function. The left ventricle demonstrates regional wall motion abnormalities. The left ventricular internal cavity size was the left ventricle is normal in size. There is mildly increased left ventricular hypertrophy. Left ventricular diastolic parameters are indeterminate. Right Ventricle: The right ventricular size is normal. No increase in right ventricular wall thickness. Global RV systolic function is has normal systolic function. The tricuspid regurgitant velocity is 3.23 m/s, and with an assumed right atrial pressure  of 3 mmHg, the estimated right ventricular systolic pressure is moderately elevated at 44.7 mmHg. Left Atrium: Left atrial size was severely dilated. Right Atrium: Right atrial size was moderately dilated Pericardium: There is no evidence of pericardial effusion. Mitral Valve: The mitral valve is grossly normal. Moderate mitral valve  regurgitation. Tricuspid Valve: The tricuspid valve is grossly normal. Tricuspid valve regurgitation moderate. Aortic Valve: The aortic valve is tricuspid. Aortic valve regurgitation  is mild. Mild aortic valve annular calcification. Pulmonic Valve: The pulmonic valve was grossly normal. Pulmonic valve regurgitation is trivial. Pulmonic regurgitation is trivial. Aorta: The aortic root is normal in size and structure. Venous: The inferior vena cava is normal in size with greater than 50% respiratory variability, suggesting right atrial pressure of 3 mmHg. IAS/Shunts: Evidence of atrial level shunting detected by color flow Doppler.  LEFT VENTRICLE PLAX 2D LVIDd:         3.36 cm LVIDs:         1.95 cm LV PW:         1.02 cm LV IVS:        1.13 cm LVOT diam:     1.60 cm LV SV:         34 ml LV SV Index:   18.79 LVOT Area:     2.01 cm  RIGHT VENTRICLE TAPSE (M-mode): 1.9 cm LEFT ATRIUM             Index       RIGHT ATRIUM           Index LA diam:        3.30 cm 1.89 cm/m  RA Area:     24.00 cm LA Vol (A2C):   78.6 ml 44.97 ml/m RA Volume:   79.80 ml  45.65 ml/m LA Vol (A4C):   95.1 ml 54.41 ml/m LA Biplane Vol: 88.4 ml 50.57 ml/m  AORTIC VALVE LVOT Vmax:   86.15 cm/s LVOT Vmean:  60.850 cm/s LVOT VTI:    0.211 m  AORTA Ao Root diam: 3.30 cm MITRAL VALVE                         TRICUSPID VALVE MV Area (PHT): 3.31 cm              TR Peak grad:   41.7 mmHg MV PHT:        66.41 msec            TR Vmax:        323.00 cm/s MV Decel Time: 229 msec MV E velocity: 117.00 cm/s 103 cm/s  SHUNTS MV A velocity: 21.40 cm/s  70.3 cm/s Systemic VTI:  0.21 m MV E/A ratio:  5.47        1.5       Systemic Diam: 1.60 cm  Rozann Lesches MD Electronically signed by Rozann Lesches MD Signature Date/Time: 08/04/2019/2:50:08 PM    Final     Radiological Exams on Admission: DG Chest Port 1 View  Result Date: 08/13/2019 CLINICAL DATA:  Vomiting and shortness of breath. EXAM: PORTABLE CHEST 1 VIEW COMPARISON:  01/09/2008 FINDINGS:  Normal heart size and mediastinal contours. There is no edema, consolidation, effusion, or pneumothorax. No osseous findings. IMPRESSION: No evidence of active disease. Electronically Signed   By: Monte Fantasia M.D.   On: 08/14/2019 06:03   ECHOCARDIOGRAM COMPLETE  Result Date: 08/07/2019   ECHOCARDIOGRAM REPORT   Patient Name:   Katrina Arnold Date of Exam: 08/10/2019 Medical Rec #:  [id number]    Height:       62.0 in Accession #:    [id number]   Weight:       162.0 lb Date of Birth:  September 25, 1932    BSA:          1.75 m Patient Age:    37 years     BP:  144/67 mmHg Patient Gender: F            HR:           40 bpm. Exam Location:  [facility] Procedure: 2D Echo, Cardiac Doppler and Color Doppler Indications:    Chest Pain 786.50 / R07.9  History:        Patient has prior history of Echocardiogram examinations, most                 recent 01/14/2008. Arrythmias:Atrial Fibrillation; Risk                 Factors:Hypertension, Diabetes and Dyslipidemia. Chronic                 anticoagulation,Hx of Adenocarcinoma of colon.  Sonographer:    BW Referring Phys: [place]  1. Left ventricular ejection fraction, by visual estimation, is 60 to 65%. The left ventricle has normal function. There is mildly increased left ventricular hypertrophy.  2. Left ventricular diastolic parameters are indeterminate.  3. The left ventricle demonstrates regional wall motion abnormalities. Apparent inferior basal akinesis.  4. Global right ventricle has normal systolic function.The right ventricular size is normal. No increase in right ventricular wall thickness.  5. Left atrial size was severely dilated.  6. Right atrial size was moderately dilated.  7. The mitral valve is grossly normal. Moderate mitral valve regurgitation.  8. The tricuspid valve is grossly normal.  9. The aortic valve is tricuspid. Aortic valve regurgitation is mild. 10. The pulmonic valve was grossly normal. Pulmonic valve regurgitation is  trivial. 11. Moderately elevated pulmonary artery systolic pressure. 12. The inferior vena cava is normal in size with greater than 50% respiratory variability, suggesting right atrial pressure of 3 mmHg. 13. Evidence of atrial level shunting detected by color flow Doppler. 14. The tricuspid regurgitant velocity is 3.23 m/s, and with an assumed right atrial pressure of 3 mmHg, the estimated right ventricular systolic pressure is moderately elevated at 44.7 mmHg. FINDINGS  Left Ventricle: Left ventricular ejection fraction, by visual estimation, is 60 to 65%. The left ventricle has normal function. The left ventricle demonstrates regional wall motion abnormalities. The left ventricular internal cavity size was the left ventricle is normal in size. There is mildly increased left ventricular hypertrophy. Left ventricular diastolic parameters are indeterminate. Right Ventricle: The right ventricular size is normal. No increase in right ventricular wall thickness. Global RV systolic function is has normal systolic function. The tricuspid regurgitant velocity is 3.23 m/s, and with an assumed right atrial pressure  of 3 mmHg, the estimated right ventricular systolic pressure is moderately elevated at 44.7 mmHg. Left Atrium: Left atrial size was severely dilated. Right Atrium: Right atrial size was moderately dilated Pericardium: There is no evidence of pericardial effusion. Mitral Valve: The mitral valve is grossly normal. Moderate mitral valve regurgitation. Tricuspid Valve: The tricuspid valve is grossly normal. Tricuspid valve regurgitation moderate. Aortic Valve: The aortic valve is tricuspid. Aortic valve regurgitation is mild. Mild aortic valve annular calcification. Pulmonic Valve: The pulmonic valve was grossly normal. Pulmonic valve regurgitation is trivial. Pulmonic regurgitation is trivial. Aorta: The aortic root is normal in size and structure. Venous: The inferior vena cava is normal in size with greater than  50% respiratory variability, suggesting right atrial pressure of 3 mmHg. IAS/Shunts: Evidence of atrial level shunting detected by color flow Doppler.  LEFT VENTRICLE PLAX 2D LVIDd:         3.36 cm LVIDs:  1.95 cm LV PW:         1.02 cm LV IVS:        1.13 cm LVOT diam:     1.60 cm LV SV:         34 ml LV SV Index:   18.79 LVOT Area:     2.01 cm  RIGHT VENTRICLE TAPSE (M-mode): 1.9 cm LEFT ATRIUM             Index       RIGHT ATRIUM           Index LA diam:        3.30 cm 1.89 cm/m  RA Area:     24.00 cm LA Vol (A2C):   78.6 ml 44.97 ml/m RA Volume:   79.80 ml  45.65 ml/m LA Vol (A4C):   95.1 ml 54.41 ml/m LA Biplane Vol: 88.4 ml 50.57 ml/m  AORTIC VALVE LVOT Vmax:   86.15 cm/s LVOT Vmean:  60.850 cm/s LVOT VTI:    0.211 m  AORTA Ao Root diam: 3.30 cm MITRAL VALVE                         TRICUSPID VALVE MV Area (PHT): 3.31 cm              TR Peak grad:   41.7 mmHg MV PHT:        66.41 msec            TR Vmax:        323.00 cm/s MV Decel Time: 229 msec MV E velocity: 117.00 cm/s 103 cm/s  SHUNTS MV A velocity: 21.40 cm/s  70.3 cm/s Systemic VTI:  0.21 m MV E/A ratio:  5.47        1.5       Systemic Diam: 1.60 cm  Rozann Lesches MD Electronically signed by Rozann Lesches MD Signature Date/Time: 08/05/2019/2:50:08 PM    Final     DVT Prophylaxis -SCD  /coumadin AM Labs Ordered, also please review Full Orders  Family Communication: Admission, patients condition and plan of care including tests being ordered have been discussed with the patient  who indicate understanding and agree with the plan   Code Status - Full Code  Likely DC to  TBD  Condition   stable  Roxan Hockey M.D on 07/21/2019 at 5:57 PM Go to [email or web address] -  for contact info  [facility] - Office  [phone]

## 2019-07-22 NOTE — ED Provider Notes (Addendum)
[facility] EMERGENCY DEPARTMENT Provider Note   CSN: [id number] Arrival date & time: 08/05/2019  [id number]   Time seen 5:40 AM   History Chief Complaint  Patient presents with  . Shortness of Breath    Katrina Arnold is a 86 y.o. female.  HPI  Patient states she started having symptoms on December 14 with rhinorrhea and muscle cramping.  She denies coughing, sore throat, fever.  She tested positive for Covid on December 23.  She states she was feeling fine when she went to bed tonight however she woke up in the middle the night and her mouth felt dry and she felt short of breath and she drank some water and then she vomited and was still having feeling of shortness of breath.  She also had a feeling like she was going to pass out.  She states it lasted 30 to 40 minutes.  She states she vomited 3-4 times.  She states she felt like she was choking in her throat when that happened.  She denies chest pain, cough, diaphoresis, nausea, or abdominal pain.  She does describe some abdominal cramping and  her left lower chest rib cage area and in her hands and feet.  She states she is never had anything like this happen before.  Please note patient's husband was admitted tonight with hypoxemia with Covid.  PCP Rosita Fire, MD     Past Medical History:  Diagnosis Date  . Atrial fibrillation, chronic (HCC)    normal EF  . Cerebrovascular disease    Left cerebral CVA 2007; residual right sided weakness; 7/09 mild plaque; no focal stenosis  . Chronic anticoagulation   . Chronic kidney disease, stage 3, mod decreased GFR    Creatinine of 1.5 in 10/2008; h/o hyper- and hypokalemia  . Colon adenocarcinoma ([place]) 2009   s/p right hemicolectomy  . Diabetes mellitus, type 2 ([place])   . Dupuytren's contracture   . Hyperlipidemia   . Hypertension     Patient Active Problem List   Diagnosis Date Noted  . Encounter for therapeutic drug monitoring 09/25/2013  . Chronic anticoagulation 10/13/2010  .  CEREBROVASCULAR DISEASE 11/17/2009  . Hx of Adenocarcinoma of colon 01/05/2009  . DIABETES MELLITUS, TYPE II 01/05/2009  . HYPERLIPIDEMIA 01/05/2009  . ANEMIA 01/05/2009  . Hypertension 01/05/2009  . Atrial fibrillation ([place]) 01/05/2009  . CHRONIC KIDNEY DISEASE STAGE II (MILD) 01/05/2009    Past Surgical History:  Procedure Laterality Date  . APPENDECTOMY    . COLONOSCOPY  6/23/200   Rourk: Left diverticula, multiple polyps, adenocarcinoma of colon 8 cm distal to ICV  . COLONOSCOPY  07/02/2012   Procedure: COLONOSCOPY;  Surgeon: Daneil Dolin, MD;  Location: [facility];  Service: Endoscopy;  Laterality: N/A;  9:30  . COLONOSCOPY W/ POLYPECTOMY  05/2009   Rourk: internal hemorrhoidal tag, site of piecemeal polypectomy in  August identified, residual polypectomy.  Multiple tubular adenomas/tubulovillous  . HEMICOLECTOMY  12/2007   Right, adenocarcinoma  . TUBAL LIGATION       OB History   No obstetric history on file.     Family History  Problem Relation Age of Onset  . Diabetes Mother   . Heart attack Father   . Lung cancer Son   . CAD Sister   . Cancer Brother   . CAD Sister   . COPD Brother   . Diabetes Brother     Social History   Tobacco Use  . Smoking status: Never Smoker  .  Smokeless tobacco: Current User    Types: Snuff  Substance Use Topics  . Alcohol use: No    Alcohol/week: 0.0 standard drinks  . Drug use: No  lives at home Lives with spouse  Home Medications Prior to Admission medications   Medication Sig Start Date End Date Taking? Authorizing Provider  amLODipine (NORVASC) 5 MG tablet Take 1 tablet (5 mg total) by mouth daily. 09/25/13   Arnoldo Lenis, MD  glipiZIDE (GLUCOTROL XL) 2.5 MG 24 hr tablet Take 2.5 mg by mouth daily.      [provider]  hydrochlorothiazide (MICROZIDE) 12.5 MG capsule Take 12.5 mg by mouth daily.    [provider]  metFORMIN (GLUCOPHAGE) 500 MG tablet Take 500 mg by mouth 2 (two) times  daily with a meal.      [provider]  metoprolol (LOPRESSOR) 50 MG tablet Take 50 mg by mouth 2 (two) times daily.      [provider]  omeprazole (PRILOSEC) 20 MG capsule Take 20 mg by mouth daily.      [provider]  potassium chloride SA (K-DUR,KLOR-CON) 20 MEQ tablet TAKE 1 TABLET BY MOUTH EVERY DAY 08/18/11   Wall, Marijo Conception, MD  simvastatin (ZOCOR) 20 MG tablet Take 20 mg by mouth at bedtime.     [provider]  warfarin (COUMADIN) 5 MG tablet TAKE 1 TABLET BY MOUTH DAILY EXCEPT TAKE 1/2 TABLET ON Charlesetta Garibaldi [facility] AND FRIDAYS 03/26/19   Arnoldo Lenis, MD    Allergies    Patient has no known allergies.  Review of Systems   Review of Systems  All other systems reviewed and are negative.   Physical Exam Updated Vital Signs BP (!) 129/59   Pulse (!) 36   Temp 98.1 F (36.7 C)   Resp (!) 26   Ht 5\' 2"  (1.575 m)   Wt 73.5 kg   SpO2 96%   BMI 29.63 kg/m   Physical Exam Vitals and nursing note reviewed.  Constitutional:      General: She is not in acute distress.    Appearance: Normal appearance. She is well-developed. She is not ill-appearing or toxic-appearing.  HENT:     Head: Normocephalic and atraumatic.     Right Ear: External ear normal.     Left Ear: External ear normal.     Nose: Nose normal. No mucosal edema or rhinorrhea.     Mouth/Throat:     Dentition: No dental abscesses.     Pharynx: No uvula swelling.  Eyes:     Conjunctiva/sclera: Conjunctivae normal.     Pupils: Pupils are equal, round, and reactive to light.  Cardiovascular:     Rate and Rhythm: Bradycardia present. Rhythm irregular.     Heart sounds: Normal heart sounds. No murmur. No friction rub. No gallop.      Comments: Patient heart rate dropped to 37 intermittently and would rise up to 45 and come back down to 38 during my exam patient denies nausea during these episodes. Pulmonary:     Effort: Pulmonary effort is normal. No respiratory distress.       Breath sounds: Normal breath sounds. No wheezing, rhonchi or rales.  Chest:     Chest wall: No tenderness or crepitus.  Abdominal:     General: Bowel sounds are normal. There is no distension.     Palpations: Abdomen is soft.     Tenderness: There is no abdominal tenderness. There is no guarding or rebound.  Musculoskeletal:        General: No tenderness. Normal range of motion.     Cervical back: Full passive range of motion without pain, normal range of motion and neck supple.     Comments: Moves all extremities well.   Skin:    General: Skin is warm and dry.     Coloration: Skin is not pale.     Findings: No erythema or rash.  Neurological:     Mental Status: She is alert and oriented to person, place, and time.     Cranial Nerves: No cranial nerve deficit.  Psychiatric:        Mood and Affect: Mood is not anxious.        Speech: Speech normal.        Behavior: Behavior normal.     ED Results / Procedures / Treatments   Labs (all labs ordered are listed, but only abnormal results are displayed)   Results for orders placed or performed during the hospital encounter of 08/08/2019  Comprehensive metabolic panel  Result Value Ref Range   Sodium 129 (L) 135 - 145 mmol/L   Potassium 4.8 3.5 - 5.1 mmol/L   Chloride 96 (L) 98 - 111 mmol/L   CO2 20 (L) 22 - 32 mmol/L   Glucose, Bld 218 (H) 70 - 99 mg/dL   BUN 16 8 - 23 mg/dL   Creatinine, Ser 1.66 (H) 0.44 - 1.00 mg/dL   Calcium 9.0 8.9 - 10.3 mg/dL   Total Protein 7.8 6.5 - 8.1 g/dL   Albumin 3.4 (L) 3.5 - 5.0 g/dL   AST 28 15 - 41 U/L   ALT 13 0 - 44 U/L   Alkaline Phosphatase 83 38 - 126 U/L   Total Bilirubin 0.6 0.3 - 1.2 mg/dL   GFR calc non Af Amer 28 (L) >60 mL/min   GFR calc Af Amer 32 (L) >60 mL/min   Anion gap 13 5 - 15  CBC with Differential  Result Value Ref Range   WBC 8.1 4.0 - 10.5 K/uL   RBC 4.33 3.87 - 5.11 MIL/uL   Hemoglobin 12.2 12.0 - 15.0 g/dL   HCT 37.4 36.0 - 46.0 %   MCV 86.4 80.0 - 100.0  fL   MCH 28.2 26.0 - 34.0 pg   MCHC 32.6 30.0 - 36.0 g/dL   RDW 13.6 11.5 - 15.5 %   Platelets 364 150 - 400 K/uL   nRBC 0.0 0.0 - 0.2 %   Neutrophils Relative % 72 %   Neutro Abs 5.7 1.7 - 7.7 K/uL   Lymphocytes Relative 19 %   Lymphs Abs 1.6 0.7 - 4.0 K/uL   Monocytes Relative 8 %   Monocytes Absolute 0.6 0.1 - 1.0 K/uL   Eosinophils Relative 0 %   Eosinophils Absolute 0.0 0.0 - 0.5 K/uL   Basophils Relative 0 %   Basophils Absolute 0.0 0.0 - 0.1 K/uL   Immature Granulocytes 1 %   Abs Immature Granulocytes 0.11 (H) 0.00 - 0.07 K/uL  Brain natriuretic peptide  Result Value Ref Range   B Natriuretic Peptide 259.0 (H) 0.0 - 100.0 pg/mL  Troponin I (High Sensitivity)  Result Value Ref Range   Troponin I (High Sensitivity) 578 (HH) <18 ng/L   Laboratory interpretation all normal except hyponatremia, positive initial troponin, minimally elevated BNP, normal potassium and calcium in light of having muscle cramping.   Results for orders placed or performed in visit on 07/10/19  Novel Coronavirus, NAA (  [facility])   Specimen: Nasopharyngeal(NP) swabs in vial transport medium   NASOPHARYNGE  TESTING  Result Value Ref Range   SARS-CoV-2, NAA Detected (A) Not Detected      EKG EKG Interpretation  Date/Time:  Monday July 22 2019 05:26:42 EST Ventricular Rate:  51 PR Interval:    QRS Duration: 150 QT Interval:  534 QTC Calculation: 473 R Axis:   -29 Text Interpretation: Atrial fibrillation bradrycardia LVH with secondary repolarization abnormality Baseline wander in lead(s) II aVF Since last tracing rate slower 12 Dec 2017 Confirmed by Rolland Porter [phone]) on 08/13/2019 5:33:43 AM   Radiology DG Chest Port 1 View  Result Date: 07/29/2019 CLINICAL DATA:  Vomiting and shortness of breath. EXAM: PORTABLE CHEST 1 VIEW COMPARISON:  01/09/2008 FINDINGS: Normal heart size and mediastinal contours. There is no edema, consolidation, effusion, or pneumothorax. No osseous findings.  IMPRESSION: No evidence of active disease. Electronically Signed   By: Monte Fantasia M.D.   On: 07/21/2019 06:03    Procedures .Critical Care Performed by: Rolland Porter, MD Authorized by: Rolland Porter, MD   Critical care provider statement:    Critical care time (minutes):  36   Critical care was necessary to treat or prevent imminent or life-threatening deterioration of the following conditions:  Circulatory failure   Critical care was time spent personally by me on the following activities:  Discussions with consultants, examination of patient, obtaining history from patient or surrogate, ordering and review of laboratory studies, ordering and review of radiographic studies, pulse oximetry and re-evaluation of patient's condition   (including critical care time)  Medications Ordered in ED Medications - No data to display  ED Course  I have reviewed the triage vital signs and the nursing notes.  Pertinent labs & imaging results that were available during my care of the patient were reviewed by me and considered in my medical decision making (see chart for details).    MDM Rules/Calculators/A&P                     When I first saw patient my concern was that maybe patient had aspirated however she describes shortness of breath before she vomited and it continued after she vomited.  Chest x-ray was done without signs of aspiration pneumonia.  Patient also does not appear short of breath during my exam and is not having a cough.  Patient was noted to be bradycardic during my exam.  Cardiac testing was done.  Patient's blood pressure is good so nothing was done to treat her bradycardia.  Recheck at 7:30 AM nurse reports patient heart rate has gotten as low as 29.  The pressure is 147/47.  Patient is on Lopressor and Norvasc, she states she has taken her medication as she normally does, she is not taking any extra pills.  She denies chest pain at this time.  She does however complain of  muscle cramping in her rib cage.  Patient states she saw Dr. Harl Bowie, cardiologist last month, her visit was on November 18.  He document she has been noncompliant with her medications.  She states she was told she did not need to follow-up for a year.   Cardiology consult was called however they have not called back at the end of my shift.  05:55 AM Dr Wilson Singer will talk to Cardiology.   Final Clinical Impression(s) / ED Diagnoses Final diagnoses:  Chronic atrial fibrillation (HCC)  Bradycardia  NSTEMI (non-ST elevated myocardial infarction) ([place])  Rx / DC Orders   Plan admission   Rolland Porter, MD, Barbette Or, MD 08/01/2019 [place], [place], MD 08/12/2019 [phone]

## 2019-07-22 NOTE — ED Triage Notes (Signed)
Pt c/o not being able to catch her breath after she vomited this morning. O2 saturation is 100% on RA, pt in NAD, and talking in full sentences. Pt is covid positive, and her husband was admitted to [facility].

## 2019-07-22 NOTE — ED Notes (Signed)
EDP aware of bradycardia

## 2019-07-22 NOTE — ED Notes (Signed)
CRITICAL VALUE ALERT  Critical Value: 578 troponin  Date & Time Notied:  0720  Provider Notified: knapp  Orders Received/Actions taken:

## 2019-07-22 NOTE — Consult Note (Addendum)
Cardiology Consultation:   Patient ID: [name] MRN: [id number]; DOB: [birth year]  Admit date: 07/26/2019 Date of Consult: 08/01/2019  Primary Care Provider: Rosita Fire, MD Primary Cardiologist: Carlyle Dolly, MD  Primary Electrophysiologist:  None    Patient Profile:   Katrina Arnold is a 86 y.o. female with a hx of  Afib, on coumadin (can't afford DOAC) who is being seen today for the evaluation of shortness of breath, bradycardia, positive troponins at the request of Dr. Wilson Singer.  History of Present Illness:   Katrina Arnold is an 86 yo female with history of Afib on coumadin, HTN, HLD, DM2. Patient came in yesterday with fairly sudden onset shortness of breath, some chest tightness that she states woke her up.  She subsequently felt nauseous and had episode of his emesis eventually.  Na 129, BNP 259, troponin 578, HR 29-40's in slow atrial fibrillation without acute ST segment changes. Patient had COVID-19 diagnosis 07/10/19 and husband currently admitted to [facility] 2 days ago with COVID-19. Says she took metoprolol last night.   Reports mild rhinorrhea with COVID-19, no cough or obvious fevers, no shortness of breath until symptoms recently.  Present COVID-19 screen is negative.  She is afebrile.  Chest x-ray shows no infiltrates.  Heart Pathway Score:     Past Medical History:  Diagnosis Date  . Atrial fibrillation, chronic ([place])   . Cerebrovascular disease    Left cerebral CVA 2007; residual right sided weakness; 7/09 mild plaque; no focal stenosis  . Chronic anticoagulation   . Chronic kidney disease, stage 3, mod decreased GFR    Creatinine of 1.5 in 10/2008; h/o hyper- and hypokalemia  . Colon adenocarcinoma ([place]) 2009   s/p right hemicolectomy  . Diabetes mellitus, type 2 ([place])   . Dupuytren's contracture   . Hyperlipidemia   . Hypertension     Past Surgical History:  Procedure Laterality Date  . APPENDECTOMY    . COLONOSCOPY  6/23/200   Rourk: Left  diverticula, multiple polyps, adenocarcinoma of colon 8 cm distal to ICV  . COLONOSCOPY  07/02/2012   Procedure: COLONOSCOPY;  Surgeon: Daneil Dolin, MD;  Location: [facility];  Service: Endoscopy;  Laterality: N/A;  9:30  . COLONOSCOPY W/ POLYPECTOMY  05/2009   Rourk: internal hemorrhoidal tag, site of piecemeal polypectomy in  August identified, residual polypectomy.  Multiple tubular adenomas/tubulovillous  . HEMICOLECTOMY  12/2007   Right, adenocarcinoma  . TUBAL LIGATION       Home Medications:  Prior to Admission medications   Medication Sig Start Date End Date Taking? Authorizing Provider  amLODipine (NORVASC) 5 MG tablet Take 1 tablet (5 mg total) by mouth daily. 09/25/13  Yes BranchAlphonse Guild, MD  glipiZIDE (GLUCOTROL XL) 2.5 MG 24 hr tablet Take 2.5 mg by mouth daily.     Yes [provider]  hydrochlorothiazide (MICROZIDE) 12.5 MG capsule Take 12.5 mg by mouth daily.   Yes [provider]  metFORMIN (GLUCOPHAGE) 500 MG tablet Take 500 mg by mouth 2 (two) times daily with a meal.     Yes [provider]  metoprolol (LOPRESSOR) 50 MG tablet Take 50 mg by mouth 2 (two) times daily.     Yes [provider]  omeprazole (PRILOSEC) 20 MG capsule Take 20 mg by mouth daily.     Yes [provider]  potassium chloride SA (K-DUR,KLOR-CON) 20 MEQ tablet TAKE 1 TABLET BY MOUTH EVERY DAY Patient taking differently: Take 20 mEq by mouth  daily.  08/18/11  Yes Wall, Marijo Conception, MD  simvastatin (ZOCOR) 20 MG tablet Take 20 mg by mouth at bedtime.    Yes [provider]  warfarin (COUMADIN) 5 MG tablet TAKE 1 TABLET BY MOUTH DAILY EXCEPT TAKE 1/2 TABLET ON MONDAYS, [place] Patient taking differently: Take 5 mg by mouth See admin instructions. TAKE 2.5 MG ON MONDAYS, WEDNESDAYS AND FRIDAYS. TAKE 5 MG ON OTHER DAYS 03/26/19  Yes Branch, Alphonse Guild, MD    Outpatient Medications: No current facility-administered medications on file  prior to encounter.   Current Outpatient Medications on File Prior to Encounter  Medication Sig Dispense Refill  . amLODipine (NORVASC) 5 MG tablet Take 1 tablet (5 mg total) by mouth daily. 30 tablet 6  . glipiZIDE (GLUCOTROL XL) 2.5 MG 24 hr tablet Take 2.5 mg by mouth daily.      . hydrochlorothiazide (MICROZIDE) 12.5 MG capsule Take 12.5 mg by mouth daily.    . metFORMIN (GLUCOPHAGE) 500 MG tablet Take 500 mg by mouth 2 (two) times daily with a meal.      . metoprolol (LOPRESSOR) 50 MG tablet Take 50 mg by mouth 2 (two) times daily.      Marland Kitchen omeprazole (PRILOSEC) 20 MG capsule Take 20 mg by mouth daily.      . potassium chloride SA (K-DUR,KLOR-CON) 20 MEQ tablet TAKE 1 TABLET BY MOUTH EVERY DAY (Patient taking differently: Take 20 mEq by mouth daily. ) 30 tablet 0  . simvastatin (ZOCOR) 20 MG tablet Take 20 mg by mouth at bedtime.     Marland Kitchen warfarin (COUMADIN) 5 MG tablet TAKE 1 TABLET BY MOUTH DAILY EXCEPT TAKE 1/2 TABLET ON MONDAYS, WEDNESDAYS AND FRIDAYS (Patient taking differently: Take 5 mg by mouth See admin instructions. TAKE 2.5 MG ON MONDAYS, WEDNESDAYS AND FRIDAYS. TAKE 5 MG ON OTHER DAYS) 90 tablet 1      Allergies:   No Known Allergies  Social History:   Social History   Socioeconomic History  . Marital status: Married    Spouse name: Not on file  . Number of children: 7  . Years of education: Not on file  . Highest education level: Not on file  Occupational History  . Occupation: Retired    Fish farm manager: RETIRED  Tobacco Use  . Smoking status: Never Smoker  . Smokeless tobacco: Current User    Types: Snuff  Substance and Sexual Activity  . Alcohol use: No    Alcohol/week: 0.0 standard drinks  . Drug use: No  . Sexual activity: Not on file  Other Topics Concern  . Not on file  Social History Narrative   10 children total, 3 deceased (1-stillborn, 1-15 days, 1-lung ca)   Social Determinants of Health   Financial Resource Strain:   . Difficulty of Paying Living  Expenses: Not on file  Food Insecurity:   . Worried About Charity fundraiser in the Last Year: Not on file  . Ran Out of Food in the Last Year: Not on file  Transportation Needs:   . Lack of Transportation (Medical): Not on file  . Lack of Transportation (Non-Medical): Not on file  Physical Activity:   . Days of Exercise per Week: Not on file  . Minutes of Exercise per Session: Not on file  Stress:   . Feeling of Stress : Not on file  Social Connections:   . Frequency of Communication with Friends and Family: Not on file  . Frequency of Social Gatherings  with Friends and Family: Not on file  . Attends Religious Services: Not on file  . Active Member of Clubs or Organizations: Not on file  . Attends Archivist Meetings: Not on file  . Marital Status: Not on file  Intimate Partner Violence:   . Fear of Current or Ex-Partner: Not on file  . Emotionally Abused: Not on file  . Physically Abused: Not on file  . Sexually Abused: Not on file    Family History:     Family History  Problem Relation Age of Onset  . Diabetes Mother   . Heart attack Father   . Lung cancer Son   . CAD Sister   . Cancer Brother   . CAD Sister   . COPD Brother   . Diabetes Brother      ROS:  Please see the history of present illness.  Review of Systems  Constitution: Negative.  HENT: Negative.   Eyes: Negative.   Cardiovascular: Positive for chest pain and dyspnea on exertion.  Respiratory: Negative.   Hematologic/Lymphatic: Negative.   Musculoskeletal: Negative.  Negative for joint pain.  Gastrointestinal: Positive for nausea and vomiting.  Genitourinary: Negative.   Neurological: Positive for dizziness.    All other ROS reviewed and negative.     Physical Exam/Data:   Vitals:   07/21/2019 0930 08/16/2019 1030 08/05/2019 1045 07/23/2019 1100  BP: (!) 145/53 (!) 140/54  (!) 144/67  Pulse: (!) 47 71 (!) 47   Resp: 18 (!) 24 (!) 21 14  Temp:      SpO2: 100% 99% 100%   Weight:        Height:       No intake or output data in the 24 hours ending 08/03/2019 1134 Last 3 Weights 07/21/2019 06/05/2019 12/12/2017  Weight (lbs) 162 lb 160 lb 161 lb  Weight (kg) 73.483 kg 72.576 kg 73.029 kg     Body mass index is 29.63 kg/m.  General: Elderly woman, in no acute distress HEENT: normal Lymph: no adenopathy Neck: no JVD Endocrine:  No thryomegaly Vascular: No carotid bruits; FA pulses 2+ bilaterally without bruits  Cardiac:  Iirreg slow; no murmur  Lungs:  clear to auscultation bilaterally, no wheezing, rhonchi or rales  Abd: soft, nontender, no hepatomegaly  Ext: no edema Musculoskeletal:  No deformities, BUE and BLE strength normal and equal Skin: warm and dry  Neuro:  CNs 2-12 intact, no focal abnormalities noted Psych:  Normal affect   EKG:  The EKG was personally reviewed and demonstrates:  Afib with slow VR with inf/lat TWI which is new Telemetry:  Telemetry was personally reviewed and demonstrates: Afib with slow rates down to 29 ranging from 30's-40's.  Relevant CV Studies:  echo 2009  LEFT VENTRICLE:   -  The left ventricle was small.   -  Overall left ventricular systolic function was vigorous.   -  Left ventricular ejection fraction was estimated , range being 65         % to 75 %.   -  Left ventricular wall thickness was mildly increased.    AORTIC VALVE:   -  Aortic valve thickness was mildly increased.     Doppler interpretation(s):   -  There was no significant aortic valvular regurgitation.    AORTA:   -  The aortic root was normal in size.    MITRAL VALVE:   -  There was mild thickening of the mitral valve.     Doppler interpretation(s):   -  There was mild mitral valvular regurgitation.    LEFT ATRIUM:   -  The left atrium was mildly dilated.    RIGHT VENTRICLE:   -  Right ventricular size was normal.   -  Right ventricular systolic function was normal.    PULMONIC VALVE:   -  The structure of the pulmonic valve appeared to be  normal.     Doppler interpretation(s):   -  There was trivial pulmonic regurgitation.    TRICUSPID VALVE:   -  Estimated PAP by TR jet is approximately 55 mm Hg.   -  The tricuspid valve structure was normal.     Doppler interpretation(s):   -  There was mild to moderate tricuspid valvular regurgitation.    RIGHT ATRIUM:   -  Right atrial size was normal.    PERICARDIUM:   -  There was no pericardial effusion.    Laboratory Data:  High Sensitivity Troponin:   Recent Labs  Lab 07/19/2019 0559  TROPONINIHS 578*     Chemistry Recent Labs  Lab  0559  NA 129*  K 4.8  CL 96*  CO2 20*  GLUCOSE 218*  BUN 16  CREATININE 1.66*  CALCIUM 9.0  GFRNONAA 28*  GFRAA 32*  ANIONGAP 13    Recent Labs  Lab 08/08/2019 0559  PROT 7.8  ALBUMIN 3.4*  AST 28  ALT 13  ALKPHOS 83  BILITOT 0.6   Hematology Recent Labs  Lab 07/27/2019 0559  WBC 8.1  RBC 4.33  HGB 12.2  HCT 37.4  MCV 86.4  MCH 28.2  MCHC 32.6  RDW 13.6  PLT 364   BNP Recent Labs  Lab  0559  BNP 259.0*     Radiology/Studies:  DG Chest Port 1 View  Result Date: 08/13/2019 CLINICAL DATA:  Vomiting and shortness of breath. EXAM: PORTABLE CHEST 1 VIEW COMPARISON:  01/09/2008 FINDINGS: Normal heart size and mediastinal contours. There is no edema, consolidation, effusion, or pneumothorax. No osseous findings. IMPRESSION: No evidence of active disease. Electronically Signed   By: Monte Fantasia M.D.   On: 08/14/2019 06:03    TIMI Risk Score for Unstable Angina or Non-ST Elevation MI:   The patient's TIMI risk score is 4, which indicates a 20% risk of all cause mortality, new or recurrent myocardial infarction or need for urgent revascularization in the next 14 days.   Assessment and Plan:   1. Acute chest pain/shortness of breath with associated nausea vomiting and bradycardia with HR down to 29, 12 days after testing positive for COVID19. Troponins >500. Need to consider ACS/myocarditis.  Pain free now. Will check echo, hold coumadin and consider transfer tomorrow already at catheterization. 2. Afib on coumadin with slow ventricular response-hold metoprolol and coumadin for potential cath. 3. HTN-hold HCTZ with Crt 1.66 4. HLD on statin     For questions or updates, please contact [place] Please consult [email or web address] for contact info under    Signed, Ermalinda Barrios, PA-C 07/25/2019 11:34 AM    Attending note:  Patient seen and examined.  I reviewed her records and discussed the case with Ms. Bonnell Public PA-C.  Ms. Perovich follows with Dr. Harl Bowie, history of permanent atrial fibrillation on Coumadin and metoprolol.  No known history of ischemic heart disease or myocardial infarction.  She was diagnosed with COVID-19 12 days ago along with her husband and daughter (husband currently at [facility]).  She reports fairly minor symptoms however, nasal congestion but otherwise doing reasonably well until the  weekend.  She states that she awoke suddenly with a feeling of tightness in her chest and breathlessness.  No palpitations.  She had subsequent nausea and emesis.  Presented to ER and found to be bradycardic in atrial fibrillation, no acute ST segment changes by ECG but high-sensitivity troponin I 578 and BNP 259.  Chest x-ray shows no acute infiltrates and she reports no active chest pain under evaluation.  Her current COVID-19 screen is negative.  On examination she is in no distress.  Afebrile, heart rate in the 40s to 50s in slow atrial fibrillation by telemetry which I personally reviewed.  Systolic blood pressure [id number] to 140s.  Lungs are clear.  Cardiac exam reveals distant irregular heart sounds, no gallop.  No peripheral edema.  Lab work shows sodium 129, potassium 4.8, BUN 16, creatinine 1.66, normal LFTs, BNP 259, high-sensitivity troponin I 578 and 506, hemoglobin 12.2, platelets 364, INR 3.3, SARS coronavirus 2 test negative.  ECG shows slow atrial fibrillation with  nonspecific ST-T changes, new inferior T wave inversions.  Patient presents with symptoms concerning for ACS although myocarditis with cardiomyopathy also possible in light of COVID-19 diagnosis 12 days ago.  Chest x-ray shows no infiltrates, she is afebrile.  Noted to be hyponatremic, high-sensitivity troponin I flat pattern but moderate elevation.  Would admit to hospitalist service for further evaluation. Hold Coumadin, check echocardiogram, hold beta-blocker.  Will continue to follow and determine next step in evaluation.  Satira Sark, M.D., F.A.C.C.

## 2019-07-23 DIAGNOSIS — N183 Chronic kidney disease, stage 3 unspecified: Secondary | ICD-10-CM | POA: Diagnosis not present

## 2019-07-23 DIAGNOSIS — I209 Angina pectoris, unspecified: Secondary | ICD-10-CM

## 2019-07-23 DIAGNOSIS — R001 Bradycardia, unspecified: Secondary | ICD-10-CM | POA: Diagnosis not present

## 2019-07-23 DIAGNOSIS — J069 Acute upper respiratory infection, unspecified: Secondary | ICD-10-CM | POA: Diagnosis not present

## 2019-07-23 DIAGNOSIS — U071 COVID-19: Secondary | ICD-10-CM | POA: Diagnosis not present

## 2019-07-23 LAB — HEMOGLOBIN A1C
Hgb A1c MFr Bld: 7.6 % — ABNORMAL HIGH (ref 4.8–5.6)
Mean Plasma Glucose: 171.42 mg/dL

## 2019-07-23 LAB — COMPREHENSIVE METABOLIC PANEL
ALT: 11 U/L (ref 0–44)
AST: 21 U/L (ref 15–41)
Albumin: 3.3 g/dL — ABNORMAL LOW (ref 3.5–5.0)
Alkaline Phosphatase: 77 U/L (ref 38–126)
Anion gap: 15 (ref 5–15)
BUN: 24 mg/dL — ABNORMAL HIGH (ref 8–23)
CO2: 18 mmol/L — ABNORMAL LOW (ref 22–32)
Calcium: 9 mg/dL (ref 8.9–10.3)
Chloride: 98 mmol/L (ref 98–111)
Creatinine, Ser: 1.81 mg/dL — ABNORMAL HIGH (ref 0.44–1.00)
GFR calc Af Amer: 29 mL/min — ABNORMAL LOW (ref >60)
GFR calc non Af Amer: 25 mL/min — ABNORMAL LOW (ref >60)
Glucose, Bld: 165 mg/dL — ABNORMAL HIGH (ref 70–99)
Potassium: 5 mmol/L (ref 3.5–5.1)
Sodium: 131 mmol/L — ABNORMAL LOW (ref 135–145)
Total Bilirubin: 0.8 mg/dL (ref 0.3–1.2)
Total Protein: 7.5 g/dL (ref 6.5–8.1)

## 2019-07-23 LAB — GLUCOSE, CAPILLARY
Glucose-Capillary: 194 mg/dL — ABNORMAL HIGH (ref 70–99)
Glucose-Capillary: 170 mg/dL — ABNORMAL HIGH (ref 70–99)
Glucose-Capillary: 180 mg/dL — ABNORMAL HIGH (ref 70–99)

## 2019-07-23 LAB — MAGNESIUM: Magnesium: 1.9 mg/dL (ref 1.7–2.4)

## 2019-07-23 LAB — CBC WITH DIFFERENTIAL/PLATELET
Abs Immature Granulocytes: 0.09 10*3/uL — ABNORMAL HIGH (ref 0.00–0.07)
Basophils Absolute: 0 10*3/uL (ref 0.0–0.1)
Basophils Relative: 0 %
Eosinophils Absolute: 0 10*3/uL (ref 0.0–0.5)
Eosinophils Relative: 0 %
HCT: 38.1 % (ref 36.0–46.0)
Hemoglobin: 12.1 g/dL (ref 12.0–15.0)
Immature Granulocytes: 1 %
Lymphocytes Relative: 29 %
Lymphs Abs: 2.3 10*3/uL (ref 0.7–4.0)
MCH: 27.6 pg (ref 26.0–34.0)
MCHC: 31.8 g/dL (ref 30.0–36.0)
MCV: 87 fL (ref 80.0–100.0)
Monocytes Absolute: 0.8 10*3/uL (ref 0.1–1.0)
Monocytes Relative: 11 %
Neutro Abs: 4.6 10*3/uL (ref 1.7–7.7)
Neutrophils Relative %: 59 %
Platelets: 394 10*3/uL (ref 150–400)
RBC: 4.38 MIL/uL (ref 3.87–5.11)
RDW: 13.6 % (ref 11.5–15.5)
WBC: 7.9 10*3/uL (ref 4.0–10.5)
nRBC: 0.3 % — ABNORMAL HIGH (ref 0.0–0.2)

## 2019-07-23 LAB — C-REACTIVE PROTEIN: CRP: 1.2 mg/dL — ABNORMAL HIGH (ref <1.0)

## 2019-07-23 LAB — PROTIME-INR
INR: 3.5 — ABNORMAL HIGH (ref 0.8–1.2)
Prothrombin Time: 35 seconds — ABNORMAL HIGH (ref 11.4–15.2)

## 2019-07-23 LAB — FERRITIN: Ferritin: 161 ng/mL (ref 11–307)

## 2019-07-23 LAB — PHOSPHORUS: Phosphorus: 3.4 mg/dL (ref 2.5–4.6)

## 2019-07-23 LAB — D-DIMER, QUANTITATIVE: D-Dimer, Quant: 0.37 ug/mL-FEU (ref 0.00–0.50)

## 2019-07-23 MED ORDER — ONDANSETRON HCL 4 MG/2ML IJ SOLN
4.0000 mg | Freq: Four times a day (QID) | INTRAMUSCULAR | Status: DC | PRN
  Administered 2019-07-23 – 2019-07-29 (×6): 4 mg via INTRAVENOUS
  Filled 2019-07-23 (×7): qty 2

## 2019-07-23 MED ORDER — POLYETHYLENE GLYCOL 3350 17 G PO PACK: 17.0000 g | PACK | Freq: Every day | ORAL | Status: DC | PRN

## 2019-07-23 MED ORDER — SODIUM CHLORIDE 0.9 % IV SOLN: INTRAVENOUS | Status: DC

## 2019-07-23 MED ORDER — ACETAMINOPHEN 325 MG PO TABS: 650.0000 mg | ORAL_TABLET | Freq: Four times a day (QID) | ORAL | Status: DC | PRN

## 2019-07-23 MED ORDER — ASPIRIN EC 81 MG PO TBEC
81.0000 mg | DELAYED_RELEASE_TABLET | Freq: Every day | ORAL | Status: DC
  Administered 2019-07-23 – 2019-07-30 (×7): 81 mg via ORAL
  Filled 2019-07-23 (×7): qty 1

## 2019-07-23 MED ORDER — TRAZODONE HCL 50 MG PO TABS: 50.0000 mg | ORAL_TABLET | Freq: Every evening | ORAL | Status: DC | PRN

## 2019-07-23 MED ORDER — SODIUM CHLORIDE 0.9 % IV SOLN: 250.0000 mL | INTRAVENOUS | Status: DC | PRN

## 2019-07-23 MED ORDER — ACETAMINOPHEN 650 MG RE SUPP: 650.0000 mg | Freq: Four times a day (QID) | RECTAL | Status: DC | PRN

## 2019-07-23 MED ORDER — INSULIN ASPART 100 UNIT/ML ~~LOC~~ SOLN
0.0000 [IU] | Freq: Three times a day (TID) | SUBCUTANEOUS | Status: DC
  Administered 2019-07-23 – 2019-07-25 (×8): 2 [IU] via SUBCUTANEOUS
  Administered 2019-07-26 (×2): 3 [IU] via SUBCUTANEOUS
  Administered 2019-07-26 – 2019-07-27 (×3): 2 [IU] via SUBCUTANEOUS
  Administered 2019-07-28 (×2): 3 [IU] via SUBCUTANEOUS
  Administered 2019-07-28: 5 [IU] via SUBCUTANEOUS
  Administered 2019-07-29 (×3): 2 [IU] via SUBCUTANEOUS

## 2019-07-23 MED ORDER — SIMVASTATIN 20 MG PO TABS
20.0000 mg | ORAL_TABLET | Freq: Every day | ORAL | Status: DC
  Administered 2019-07-23 – 2019-07-28 (×6): 20 mg via ORAL
  Filled 2019-07-23 (×7): qty 1

## 2019-07-23 MED ORDER — ONDANSETRON HCL 4 MG PO TABS
4.0000 mg | ORAL_TABLET | Freq: Four times a day (QID) | ORAL | Status: DC | PRN
  Administered 2019-07-23: 4 mg via ORAL
  Filled 2019-07-23 (×2): qty 1

## 2019-07-23 MED ORDER — SODIUM CHLORIDE 0.9% FLUSH
3.0000 mL | Freq: Two times a day (BID) | INTRAVENOUS | Status: DC
  Administered 2019-07-23 – 2019-07-29 (×11): 3 mL via INTRAVENOUS

## 2019-07-23 MED ORDER — PANTOPRAZOLE SODIUM 40 MG PO TBEC
40.0000 mg | DELAYED_RELEASE_TABLET | Freq: Every day | ORAL | Status: DC
  Administered 2019-07-23 – 2019-07-29 (×6): 40 mg via ORAL
  Filled 2019-07-23 (×6): qty 1

## 2019-07-23 MED ORDER — INSULIN ASPART 100 UNIT/ML ~~LOC~~ SOLN
0.0000 [IU] | Freq: Every day | SUBCUTANEOUS | Status: DC
  Administered 2019-07-27: 2 [IU] via SUBCUTANEOUS

## 2019-07-23 MED ORDER — SODIUM CHLORIDE 0.9% FLUSH: 3.0000 mL | INTRAVENOUS | Status: DC | PRN

## 2019-07-23 MED ORDER — AMLODIPINE BESYLATE 5 MG PO TABS
5.0000 mg | ORAL_TABLET | Freq: Every day | ORAL | Status: DC
  Administered 2019-07-23 – 2019-07-24 (×2): 5 mg via ORAL
  Filled 2019-07-23 (×2): qty 1

## 2019-07-23 NOTE — Care Management Important Message (Signed)
Important Message  Patient Details  Name: Katrina Arnold MRN: [id number] Date of Birth: [birth year]   Medicare Important Message Given:  Yes(Emily, RN agreed to deliver letter to patient due to contact precautions)     [name] 07/23/2019, 2:57 PM

## 2019-07-23 NOTE — Progress Notes (Signed)
ANTICOAGULATION CONSULT NOTE - Initial Consult  Pharmacy Consult for Heparin Indication: ACS/STEMI  No Known Allergies  Patient Measurements: Height: 5\' 2"  (157.5 cm) Weight: 162 lb (73.5 kg) IBW/kg (Calculated) : 50.1 HEPARIN DW (KG): 65.9  Vital Signs: Temp: 98.1 F (36.7 C) (01/05 0453) Temp Source: Oral (01/05 0453) BP: 130/40 (01/05 0453) Pulse Rate: 33 (01/05 0453)  Labs: Recent Labs    08/14/2019 0559 08/16/2019 1107 08/03/2019 2144 07/23/19 0514 07/23/19 0900  HGB 12.2  --   --  12.1  --   HCT 37.4  --   --  38.1  --   PLT 364  --   --  394  --   LABPROT  --  33.2*  --   --  35.0*  INR  --  3.3*  --   --  3.5*  CREATININE 1.66*  --   --  1.81*  --   TROPONINIHS 578* 506* 325*  --   --     Estimated Creatinine Clearance: 21.7 mL/min (A) (by C-G formula based on SCr of 1.81 mg/dL (H)).   Medical History: Past Medical History:  Diagnosis Date  . Atrial fibrillation, chronic ([place])   . Cerebrovascular disease    Left cerebral CVA 2007; residual right sided weakness; 7/09 mild plaque; no focal stenosis  . Chronic anticoagulation   . Chronic kidney disease, stage 3, mod decreased GFR    Creatinine of 1.5 in 10/2008; h/o hyper- and hypokalemia  . Colon adenocarcinoma ([place]) 2009   s/p right hemicolectomy  . Diabetes mellitus, type 2 ([place])   . Dupuytren's contracture   . Hyperlipidemia   . Hypertension     Medications:  Medications Prior to Admission  Medication Sig Dispense Refill Last Dose  . amLODipine (NORVASC) 5 MG tablet Take 1 tablet (5 mg total) by mouth daily. 30 tablet 6 07/21/2019 at Unknown time  . glipiZIDE (GLUCOTROL XL) 2.5 MG 24 hr tablet Take 2.5 mg by mouth daily.     07/21/2019 at Unknown time  . hydrochlorothiazide (MICROZIDE) 12.5 MG capsule Take 12.5 mg by mouth daily.   07/21/2019 at Unknown time  . metFORMIN (GLUCOPHAGE) 500 MG tablet Take 500 mg by mouth 2 (two) times daily with a meal.     07/21/2019 at Unknown time  . metoprolol (LOPRESSOR) 50  MG tablet Take 50 mg by mouth 2 (two) times daily.     07/21/2019 at 2130  . omeprazole (PRILOSEC) 20 MG capsule Take 20 mg by mouth daily.     07/21/2019 at Unknown time  . potassium chloride SA (K-DUR,KLOR-CON) 20 MEQ tablet TAKE 1 TABLET BY MOUTH EVERY DAY (Patient taking differently: Take 20 mEq by mouth daily. ) 30 tablet 0 07/21/2019 at Unknown time  . simvastatin (ZOCOR) 20 MG tablet Take 20 mg by mouth at bedtime.    07/21/2019 at Unknown time  . warfarin (COUMADIN) 5 MG tablet TAKE 1 TABLET BY MOUTH DAILY EXCEPT TAKE 1/2 TABLET ON MONDAYS, WEDNESDAYS AND FRIDAYS (Patient taking differently: Take 5 mg by mouth See admin instructions. TAKE 2.5 MG ON MONDAYS, WEDNESDAYS AND FRIDAYS. TAKE 5 MG ON OTHER DAYS) 90 tablet 1 07/21/2019 at Unknown time    Assessment: 86 yo female presented to ED with chest pain/dypsnea.No acute ST abnormalities by EKG but initial HS Troponin 578 with repeat values at 506 and 325.  She is chronically anticoagulated on coumadin for afib. INR this AM is supratherapeutic at 3.5. pharmacy asked to transition to heparin once INR <  2.  Goal of Therapy:  Heparin level 0.3-0.7 units/ml Monitor platelets by anticoagulation protocol: Yes   Plan:  Daily PT-INR Start heparin infusion at 800 units/hr once INR <2 Check anti-Xa level in 8 hours and daily while on heparin Continue to monitor H&H and platelets Trenton Gammon, Ayauna Mcnay L 07/23/2019,8:47 AM

## 2019-07-23 NOTE — Plan of Care (Signed)

## 2019-07-23 NOTE — Progress Notes (Signed)
PROGRESS NOTE  Katrina Arnold [id number] DOB: [birth year] DOA:  PCP: Rosita Fire, MD  Brief History:  86 year old female with a history of chronic atrial fibrillation on warfarin, hypertension, hyperlipidemia, diabetes mellitus type 2 presenting with chest discomfort and shortness of breath that woke her up from bed on the morning of 08/11/2019.  She had some associated nausea and emesis eventually.  She has some associated malaise with nausea and myalgias.  She was diagnosed with COVID-19 on 07/10/2019 and her husband is currently admitted to [facility] 2 days prior to this admission.  Regarding her COVID-19 diagnosis, the patient had been fairly asymptomatic prior to the onset of her current symptoms for this admission. The patient was found to have atrial fibrillation with slow ventricular response with heart rate as low as 30s.  Cardiology was consulted to assist with management.  Troponins were elevated with a peak of 578.  There was concern for ACS. Repeat SARS-CoV2 RT-PCR is negative.  Assessment/Plan: elevated troponin/Angina Pectoris -Concern for ACS -Troponins 578>>> 506>>> 325 -Appreciate cardiology consult and following -Optimizing renal function and coagulopathy prior to transfer for heart catheterization -case discussed with cardiology, Dr. Domenic Polite -start ASA 81 mg  Acute on chronic renal failure--CKD stage III -Baseline creatinine 1.1-1.4 -Serum creatinine peaked 1.81 -Holding HCTZ  Persistent atrial fibrillation with slow ventricular response -Holding metoprolol -Appreciate cardiology follow-up -Personally reviewed EKG--atrial fibrillation, nonspecific T wave change  COVID-19 infection -Initially tested + 07/10/2019 -Repeat SARS-CoV2 RT-PCR is negative -CRP 1.2 -Ferritin 161 -D-dimer 0.37  Coagulopathy -INR has trended up 3.3>>> 3.5 -Allow INR to trend back down without reversal  Hyperlipidemia -Continue statin  Diabetes  mellitus type 2 -Holding metformin and glipizide -Hemoglobin A1c -NovoLog sliding scale  Essential hypertension -Continue amlodipine  Hyponatremia -due to volume depletion -judicious IVF      Disposition Plan:   Transfer to [facility] when medically optimized Family Communication:   No Family at bedside  Consultants:  cardiology  Code Status:  FULL   DVT Prophylaxis:  coumadin   Procedures: As Listed in Progress Note Above  Antibiotics: None       Subjective: She had some dizziness this am but feels hungry.  Patient denies fevers, chills, headache, chest pain, dyspnea, nausea, vomiting, diarrhea, abdominal pain, dysuria, hematuria, hematochezia, and melena.   Objective: Vitals:   07/24/2019 1800 08/02/2019 1830 07/27/2019 2025 07/23/19 0453  BP:   124/88 (!) 130/40  Pulse: (!) 33 (!) 38 (!) 43 (!) 33  Resp: (!) 22 (!) 23 18 20   Temp:   98.2 F (36.8 C) 98.1 F (36.7 C)  TempSrc:   Oral Oral  SpO2: 100% 97% 91% 100%  Weight:      Height:        Intake/Output Summary (Last 24 hours) at 07/23/2019 [id number] Last data filed at 07/23/2019 0344 Gross per 24 hour  Intake 120 ml  Output --  Net 120 ml   Weight change:  Exam:   General:  Pt is alert, follows commands appropriately, not in acute distress  HEENT: No icterus, No thrush, No neck mass, [place]/AT  Cardiovascular: IRRR, S1/S2, no rubs, no gallops  Respiratory: diminished BS but CTA bilaterally, no wheezing, no crackles, no rhonchi  Abdomen: Soft/+BS, non tender, non distended, no guarding  Extremities: No edema, No lymphangitis, No petechiae, No rashes, no synovitis   Data Reviewed: I have personally reviewed following labs and imaging studies Basic Metabolic Panel: Recent Labs  Lab 07/23/2019 0559 07/23/19 0514  NA 129* 131*  K 4.8 5.0  CL 96* 98  CO2 20* 18*  GLUCOSE 218* 165*  BUN 16 24*  CREATININE 1.66* 1.81*  CALCIUM 9.0 9.0  MG  --  1.9  PHOS  --  3.4   Liver Function Tests: Recent Labs   Lab 07/21/2019 0559 07/23/19 0514  AST 28 21  ALT 13 11  ALKPHOS 83 77  BILITOT 0.6 0.8  PROT 7.8 7.5  ALBUMIN 3.4* 3.3*   No results for input(s): LIPASE, AMYLASE in the last 168 hours. No results for input(s): AMMONIA in the last 168 hours. Coagulation Profile: Recent Labs  Lab 07/25/2019 1107 07/23/19 0900  INR 3.3* 3.5*   CBC: Recent Labs  Lab 07/29/2019 0559 07/23/19 0514  WBC 8.1 7.9  NEUTROABS 5.7 4.6  HGB 12.2 12.1  HCT 37.4 38.1  MCV 86.4 87.0  PLT 364 394   Cardiac Enzymes: No results for input(s): CKTOTAL, CKMB, CKMBINDEX, TROPONINI in the last 168 hours. BNP: Invalid input(s): POCBNP CBG: No results for input(s): GLUCAP in the last 168 hours. HbA1C: No results for input(s): HGBA1C in the last 72 hours. Urine analysis:    Component Value Date/Time   COLORURINE YELLOW 04/23/2017 0049   APPEARANCEUR HAZY (A) 04/23/2017 0049   LABSPEC 1.021 04/23/2017 0049   PHURINE 5.0 04/23/2017 0049   GLUCOSEU NEGATIVE 04/23/2017 0049   GLUCOSEU NEG mg/dL 07/06/2009 1903   HGBUR MODERATE (A) 04/23/2017 0049   BILIRUBINUR NEGATIVE 04/23/2017 0049   KETONESUR NEGATIVE 04/23/2017 0049   PROTEINUR 100 (A) 04/23/2017 0049   UROBILINOGEN 1 07/06/2009 1903   NITRITE NEGATIVE 04/23/2017 0049   LEUKOCYTESUR NEGATIVE 04/23/2017 0049   Sepsis Labs: @LABRCNTIP (procalcitonin:4,lacticidven:4) ) Recent Results (from the past 240 hour(s))  Respiratory Panel by RT PCR (Flu A&B, Covid) - Nasopharyngeal Swab     Status: None   Collection Time: 07/24/2019  8:42 AM   Specimen: Nasopharyngeal Swab  Result Value Ref Range Status   SARS Coronavirus 2 by RT PCR NEGATIVE NEGATIVE Final    Comment: (NOTE) SARS-CoV-2 target nucleic acids are NOT DETECTED. The SARS-CoV-2 RNA is generally detectable in upper respiratoy specimens during the acute phase of infection. The lowest concentration of SARS-CoV-2 viral copies this assay can detect is 131 copies/mL. A negative result does not  preclude SARS-Cov-2 infection and should not be used as the sole basis for treatment or other patient management decisions. A negative result may occur with  improper specimen collection/handling, submission of specimen other than nasopharyngeal swab, presence of viral mutation(s) within the areas targeted by this assay, and inadequate number of viral copies (<131 copies/mL). A negative result must be combined with clinical observations, patient history, and epidemiological information. The expected result is Negative. Fact Sheet for Patients:  PinkCheek.be Fact Sheet for Healthcare Providers:  GravelBags.it This test is not yet ap proved or cleared by the [place] FDA and  has been authorized for detection and/or diagnosis of SARS-CoV-2 by FDA under an Emergency Use Authorization (EUA). This EUA will remain  in effect (meaning this test can be used) for the duration of the COVID-19 declaration under Section 564(b)(1) of the Act, 21 U.S.C. section 360bbb-3(b)(1), unless the authorization is terminated or revoked sooner.    Influenza A by PCR NEGATIVE NEGATIVE Final   Influenza B by PCR NEGATIVE NEGATIVE Final    Comment: (NOTE) The Xpert Xpress SARS-CoV-2/FLU/RSV assay is intended as an aid in  the diagnosis of influenza from  Nasopharyngeal swab specimens and  should not be used as a sole basis for treatment. Nasal washings and  aspirates are unacceptable for Xpert Xpress SARS-CoV-2/FLU/RSV  testing. Fact Sheet for Patients: PinkCheek.be Fact Sheet for Healthcare Providers: GravelBags.it This test is not yet approved or cleared by the [place] FDA and  has been authorized for detection and/or diagnosis of SARS-CoV-2 by  FDA under an Emergency Use Authorization (EUA). This EUA will remain  in effect (meaning this test can be used) for the duration of the    Covid-19 declaration under Section 564(b)(1) of the Act, 21  U.S.C. section 360bbb-3(b)(1), unless the authorization is  terminated or revoked. Performed at [facility], [address], [place], [place] [address]      Scheduled Meds: . albuterol  2 puff Inhalation Q6H  . amLODipine  5 mg Oral Daily  . vitamin C  500 mg Oral Daily  . aspirin EC  81 mg Oral Daily  . multivitamin with minerals  1 tablet Oral Daily  . pantoprazole  40 mg Oral Daily  . simvastatin  20 mg Oral QHS  . sodium chloride flush  3 mL Intravenous Q12H  . zinc sulfate  220 mg Oral Daily   Continuous Infusions: . sodium chloride    . sodium chloride      Procedures/Studies: DG Chest Port 1 View  Result Date: 07/23/2019 CLINICAL DATA:  Vomiting and shortness of breath. EXAM: PORTABLE CHEST 1 VIEW COMPARISON:  01/09/2008 FINDINGS: Normal heart size and mediastinal contours. There is no edema, consolidation, effusion, or pneumothorax. No osseous findings. IMPRESSION: No evidence of active disease. Electronically Signed   By: Monte Fantasia M.D.   On: 08/05/2019 06:03   ECHOCARDIOGRAM COMPLETE  Result Date: 08/03/2019   ECHOCARDIOGRAM REPORT   Patient Name:   SHERROL BATTERMAN Date of Exam: 08/17/2019 Medical Rec #:  [id number]    Height:       62.0 in Accession #:    [id number]   Weight:       162.0 lb Date of Birth:  [birth year]    BSA:          1.75 m Patient Age:    63 years     BP:           144/67 mmHg Patient Gender: F            HR:           40 bpm. Exam Location:  [facility] Procedure: 2D Echo, Cardiac Doppler and Color Doppler Indications:    Chest Pain 786.50 / R07.9  History:        Patient has prior history of Echocardiogram examinations, most                 recent 01/14/2008. Arrythmias:Atrial Fibrillation; Risk                 Factors:Hypertension, Diabetes and Dyslipidemia. Chronic                 anticoagulation,Hx of Adenocarcinoma of colon.  Sonographer:    BW Referring Phys: [facility]   1. Left ventricular ejection fraction, by visual estimation, is 60 to 65%. The left ventricle has normal function. There is mildly increased left ventricular hypertrophy.  2. Left ventricular diastolic parameters are indeterminate.  3. The left ventricle demonstrates regional wall motion abnormalities. Apparent inferior basal akinesis.  4. Global right ventricle has normal systolic function.The right ventricular size is normal.  No increase in right ventricular wall thickness.  5. Left atrial size was severely dilated.  6. Right atrial size was moderately dilated.  7. The mitral valve is grossly normal. Moderate mitral valve regurgitation.  8. The tricuspid valve is grossly normal.  9. The aortic valve is tricuspid. Aortic valve regurgitation is mild. 10. The pulmonic valve was grossly normal. Pulmonic valve regurgitation is trivial. 11. Moderately elevated pulmonary artery systolic pressure. 12. The inferior vena cava is normal in size with greater than 50% respiratory variability, suggesting right atrial pressure of 3 mmHg. 13. Evidence of atrial level shunting detected by color flow Doppler. 14. The tricuspid regurgitant velocity is 3.23 m/s, and with an assumed right atrial pressure of 3 mmHg, the estimated right ventricular systolic pressure is moderately elevated at 44.7 mmHg. FINDINGS  Left Ventricle: Left ventricular ejection fraction, by visual estimation, is 60 to 65%. The left ventricle has normal function. The left ventricle demonstrates regional wall motion abnormalities. The left ventricular internal cavity size was the left ventricle is normal in size. There is mildly increased left ventricular hypertrophy. Left ventricular diastolic parameters are indeterminate. Right Ventricle: The right ventricular size is normal. No increase in right ventricular wall thickness. Global RV systolic function is has normal systolic function. The tricuspid regurgitant velocity is 3.23 m/s, and with an assumed right  atrial pressure  of 3 mmHg, the estimated right ventricular systolic pressure is moderately elevated at 44.7 mmHg. Left Atrium: Left atrial size was severely dilated. Right Atrium: Right atrial size was moderately dilated Pericardium: There is no evidence of pericardial effusion. Mitral Valve: The mitral valve is grossly normal. Moderate mitral valve regurgitation. Tricuspid Valve: The tricuspid valve is grossly normal. Tricuspid valve regurgitation moderate. Aortic Valve: The aortic valve is tricuspid. Aortic valve regurgitation is mild. Mild aortic valve annular calcification. Pulmonic Valve: The pulmonic valve was grossly normal. Pulmonic valve regurgitation is trivial. Pulmonic regurgitation is trivial. Aorta: The aortic root is normal in size and structure. Venous: The inferior vena cava is normal in size with greater than 50% respiratory variability, suggesting right atrial pressure of 3 mmHg. IAS/Shunts: Evidence of atrial level shunting detected by color flow Doppler.  LEFT VENTRICLE PLAX 2D LVIDd:         3.36 cm LVIDs:         1.95 cm LV PW:         1.02 cm LV IVS:        1.13 cm LVOT diam:     1.60 cm LV SV:         34 ml LV SV Index:   18.79 LVOT Area:     2.01 cm  RIGHT VENTRICLE TAPSE (M-mode): 1.9 cm LEFT ATRIUM             Index       RIGHT ATRIUM           Index LA diam:        3.30 cm 1.89 cm/m  RA Area:     24.00 cm LA Vol (A2C):   78.6 ml 44.97 ml/m RA Volume:   79.80 ml  45.65 ml/m LA Vol (A4C):   95.1 ml 54.41 ml/m LA Biplane Vol: 88.4 ml 50.57 ml/m  AORTIC VALVE LVOT Vmax:   86.15 cm/s LVOT Vmean:  60.850 cm/s LVOT VTI:    0.211 m  AORTA Ao Root diam: 3.30 cm MITRAL VALVE  TRICUSPID VALVE MV Area (PHT): 3.31 cm              TR Peak grad:   41.7 mmHg MV PHT:        66.41 msec            TR Vmax:        323.00 cm/s MV Decel Time: 229 msec MV E velocity: 117.00 cm/s 103 cm/s  SHUNTS MV A velocity: 21.40 cm/s  70.3 cm/s Systemic VTI:  0.21 m MV E/A ratio:  5.47         1.5       Systemic Diam: 1.60 cm  Rozann Lesches MD Electronically signed by Rozann Lesches MD Signature Date/Time: 08/14/2019/2:50:08 PM    Final     Orson Eva, DO  [facility] Pager [phone]  If 7PM-7AM, please contact night-coverage [email or web address] Password [email or web address] 07/23/2019, 9:18 AM   LOS: 1 day

## 2019-07-23 NOTE — Progress Notes (Addendum)
Progress Note  Patient Name: Katrina Arnold Date of Encounter: 07/23/2019  Primary Cardiologist: Carlyle Dolly, MD   Subjective   Denies any recurrent chest pain or dyspnea. Did have dizziness this AM but she thinks this was secondary to being flat in bed. Improved with adjustment of her pillow. No dizziness with positional changes.   Inpatient Medications    Scheduled Meds: . albuterol  2 puff Inhalation Q6H  . amLODipine  5 mg Oral Daily  . vitamin C  500 mg Oral Daily  . aspirin EC  81 mg Oral Daily  . multivitamin with minerals  1 tablet Oral Daily  . pantoprazole  40 mg Oral Daily  . simvastatin  20 mg Oral QHS  . sodium chloride flush  3 mL Intravenous Q12H  . zinc sulfate  220 mg Oral Daily   Continuous Infusions: . sodium chloride     PRN Meds: sodium chloride, acetaminophen **OR** acetaminophen, chlorpheniramine-HYDROcodone, guaiFENesin-dextromethorphan, ondansetron **OR** ondansetron (ZOFRAN) IV, polyethylene glycol, sodium chloride flush, traZODone   Vital Signs    Vitals:   07/29/2019 1800 08/17/2019 1830 08/13/2019 2025 07/23/19 0453  BP:   124/88 (!) 130/40  Pulse: (!) 33 (!) 38 (!) 43 (!) 33  Resp: (!) 22 (!) 23 18 20   Temp:   98.2 F (36.8 C) 98.1 F (36.7 C)  TempSrc:   Oral Oral  SpO2: 100% 97% 91% 100%  Weight:      Height:        Intake/Output Summary (Last 24 hours) at 07/23/2019 0834 Last data filed at 07/23/2019 0344 Gross per 24 hour  Intake 120 ml  Output --  Net 120 ml    Last 3 Weights 07/29/2019 06/05/2019 12/12/2017  Weight (lbs) 162 lb 160 lb 161 lb  Weight (kg) 73.483 kg 72.576 kg 73.029 kg      Telemetry    Atrial fibrillation with slow-ventricular response, HR in mid-30's to 50's. Pauses up to 2.4 seconds.  - Personally Reviewed  ECG    Atrial fibrillation with slow ventricular response, HR 35 with PVC's.  - Personally Reviewed  Physical Exam   General: Well developed, well nourished, female appearing in no acute  distress. Head: Normocephalic, atraumatic.  Neck: Supple without bruits, JVD not elevated. Lungs:  Resp regular and unlabored, CTA without wheezing or rales. Heart: Irregularly irregular, S1, S2, no S3, S4, or murmur; no rub. Abdomen: Soft, non-tender, non-distended with normoactive bowel sounds. No hepatomegaly. No rebound/guarding. No obvious abdominal masses. Extremities: No clubbing, cyanosis, or lower extremity edema. Distal pedal pulses are 2+ bilaterally. Neuro: Alert and oriented X 3. Moves all extremities spontaneously. Psych: Normal affect.  Labs    Chemistry Recent Labs  Lab 08/08/2019 0559 07/23/19 0514  NA 129* 131*  K 4.8 5.0  CL 96* 98  CO2 20* 18*  GLUCOSE 218* 165*  BUN 16 24*  CREATININE 1.66* 1.81*  CALCIUM 9.0 9.0  PROT 7.8 7.5  ALBUMIN 3.4* 3.3*  AST 28 21  ALT 13 11  ALKPHOS 83 77  BILITOT 0.6 0.8  GFRNONAA 28* 25*  GFRAA 32* 29*  ANIONGAP 13 15     Hematology Recent Labs  Lab 07/21/2019 0559 07/23/19 0514  WBC 8.1 7.9  RBC 4.33 4.38  HGB 12.2 12.1  HCT 37.4 38.1  MCV 86.4 87.0  MCH 28.2 27.6  MCHC 32.6 31.8  RDW 13.6 13.6  PLT 364 394    Cardiac EnzymesNo results for input(s): TROPONINI in the last 168 hours.  No results for input(s): TROPIPOC in the last 168 hours.   BNP Recent Labs  Lab 08/04/2019 0559  BNP 259.0*     DDimer  Recent Labs  Lab 07/23/19 0514  DDIMER 0.37     Radiology    DG Chest Port 1 View  Result Date: 08/05/2019 CLINICAL DATA:  Vomiting and shortness of breath. EXAM: PORTABLE CHEST 1 VIEW COMPARISON:  01/09/2008 FINDINGS: Normal heart size and mediastinal contours. There is no edema, consolidation, effusion, or pneumothorax. No osseous findings. IMPRESSION: No evidence of active disease. Electronically Signed   By: Monte Fantasia M.D.   On: 08/08/2019 06:03   ECHOCARDIOGRAM COMPLETE  Result Date: 08/12/2019   ECHOCARDIOGRAM REPORT   Patient Name:   Katrina Arnold Date of Exam: 08/15/2019 Medical Rec #:   [id number]    Height:       62.0 in Accession #:    [id number]   Weight:       162.0 lb Date of Birth:  06/14/1933    BSA:          1.75 m Patient Age:    86 years     BP:           144/67 mmHg Patient Gender: F            HR:           40 bpm. Exam Location:  [facility] Procedure: 2D Echo, Cardiac Doppler and Color Doppler Indications:    Chest Pain 786.50 / R07.9  History:        Patient has prior history of Echocardiogram examinations, most                 recent 01/14/2008. Arrythmias:Atrial Fibrillation; Risk                 Factors:Hypertension, Diabetes and Dyslipidemia. Chronic                 anticoagulation,Hx of Adenocarcinoma of colon.  Sonographer:    BW Referring Phys: [place]  1. Left ventricular ejection fraction, by visual estimation, is 60 to 65%. The left ventricle has normal function. There is mildly increased left ventricular hypertrophy.  2. Left ventricular diastolic parameters are indeterminate.  3. The left ventricle demonstrates regional wall motion abnormalities. Apparent inferior basal akinesis.  4. Global right ventricle has normal systolic function.The right ventricular size is normal. No increase in right ventricular wall thickness.  5. Left atrial size was severely dilated.  6. Right atrial size was moderately dilated.  7. The mitral valve is grossly normal. Moderate mitral valve regurgitation.  8. The tricuspid valve is grossly normal.  9. The aortic valve is tricuspid. Aortic valve regurgitation is mild. 10. The pulmonic valve was grossly normal. Pulmonic valve regurgitation is trivial. 11. Moderately elevated pulmonary artery systolic pressure. 12. The inferior vena cava is normal in size with greater than 50% respiratory variability, suggesting right atrial pressure of 3 mmHg. 13. Evidence of atrial level shunting detected by color flow Doppler. 14. The tricuspid regurgitant velocity is 3.23 m/s, and with an assumed right atrial pressure of 3 mmHg, the  estimated right ventricular systolic pressure is moderately elevated at 44.7 mmHg. FINDINGS  Left Ventricle: Left ventricular ejection fraction, by visual estimation, is 60 to 65%. The left ventricle has normal function. The left ventricle demonstrates regional wall motion abnormalities. The left ventricular internal cavity size was the left ventricle is normal in size. There is mildly  increased left ventricular hypertrophy. Left ventricular diastolic parameters are indeterminate. Right Ventricle: The right ventricular size is normal. No increase in right ventricular wall thickness. Global RV systolic function is has normal systolic function. The tricuspid regurgitant velocity is 3.23 m/s, and with an assumed right atrial pressure  of 3 mmHg, the estimated right ventricular systolic pressure is moderately elevated at 44.7 mmHg. Left Atrium: Left atrial size was severely dilated. Right Atrium: Right atrial size was moderately dilated Pericardium: There is no evidence of pericardial effusion. Mitral Valve: The mitral valve is grossly normal. Moderate mitral valve regurgitation. Tricuspid Valve: The tricuspid valve is grossly normal. Tricuspid valve regurgitation moderate. Aortic Valve: The aortic valve is tricuspid. Aortic valve regurgitation is mild. Mild aortic valve annular calcification. Pulmonic Valve: The pulmonic valve was grossly normal. Pulmonic valve regurgitation is trivial. Pulmonic regurgitation is trivial. Aorta: The aortic root is normal in size and structure. Venous: The inferior vena cava is normal in size with greater than 50% respiratory variability, suggesting right atrial pressure of 3 mmHg. IAS/Shunts: Evidence of atrial level shunting detected by color flow Doppler.  LEFT VENTRICLE PLAX 2D LVIDd:         3.36 cm LVIDs:         1.95 cm LV PW:         1.02 cm LV IVS:        1.13 cm LVOT diam:     1.60 cm LV SV:         34 ml LV SV Index:   18.79 LVOT Area:     2.01 cm  RIGHT VENTRICLE TAPSE  (M-mode): 1.9 cm LEFT ATRIUM             Index       RIGHT ATRIUM           Index LA diam:        3.30 cm 1.89 cm/m  RA Area:     24.00 cm LA Vol (A2C):   78.6 ml 44.97 ml/m RA Volume:   79.80 ml  45.65 ml/m LA Vol (A4C):   95.1 ml 54.41 ml/m LA Biplane Vol: 88.4 ml 50.57 ml/m  AORTIC VALVE LVOT Vmax:   86.15 cm/s LVOT Vmean:  60.850 cm/s LVOT VTI:    0.211 m  AORTA Ao Root diam: 3.30 cm MITRAL VALVE                         TRICUSPID VALVE MV Area (PHT): 3.31 cm              TR Peak grad:   41.7 mmHg MV PHT:        66.41 msec            TR Vmax:        323.00 cm/s MV Decel Time: 229 msec MV E velocity: 117.00 cm/s 103 cm/s  SHUNTS MV A velocity: 21.40 cm/s  70.3 cm/s Systemic VTI:  0.21 m MV E/A ratio:  5.47        1.5       Systemic Diam: 1.60 cm  Rozann Lesches MD Electronically signed by Rozann Lesches MD Signature Date/Time: 07/19/2019/2:50:08 PM    Final     Cardiac Studies   Echocardiogram: 08/15/2019 IMPRESSIONS    1. Left ventricular ejection fraction, by visual estimation, is 60 to 65%. The left ventricle has normal function. There is mildly increased left ventricular hypertrophy.  2. Left ventricular diastolic parameters are indeterminate.  3. The left ventricle demonstrates regional wall motion abnormalities. Apparent inferior basal akinesis.  4. Global right ventricle has normal systolic function.The right ventricular size is normal. No increase in right ventricular wall thickness.  5. Left atrial size was severely dilated.  6. Right atrial size was moderately dilated.  7. The mitral valve is grossly normal. Moderate mitral valve regurgitation.  8. The tricuspid valve is grossly normal.  9. The aortic valve is tricuspid. Aortic valve regurgitation is mild. 10. The pulmonic valve was grossly normal. Pulmonic valve regurgitation is trivial. 11. Moderately elevated pulmonary artery systolic pressure. 12. The inferior vena cava is normal in size with greater than 50% respiratory  variability, suggesting right atrial pressure of 3 mmHg. 13. Evidence of atrial level shunting detected by color flow Doppler. 14. The tricuspid regurgitant velocity is 3.23 m/s, and with an assumed right atrial pressure of 3 mmHg, the estimated right ventricular systolic pressure is moderately elevated at 44.7 mmHg.  Patient Profile     86 y.o. female w/ PMH of persistent atrial fibrillation (on Coumadin), HTN, HLD and Type 2 DM who presented to [facility] ED on 07/21/2018 for evaluation of chest pain and dyspnea. Positive for COVID on 07/10/2019.  Assessment & Plan    1. Chest Pain/ Dyspnea on Exertion - occurring the day prior to admission with associated nausea and vomiting. No acute ST abnormalities by EKG but initial HS Troponin 578 with repeat values at 506 and 325. - echocardiogram showed a preserved EF of 60-65% with inferior basal akinesis.  - discussed with Dr. Domenic Polite and will plan for a cardiac catheterization once INR within acceptable range and renal function has improved. Will start IVF at 50 mL/hr.  If no significant CAD noted, would also consider chronotropic incompetence could be playing a role in her symptoms as HR was in the 30's to 40's upon admission.  - continue statin. BB held given bradycardia. Will start ASA 81mg  daily.   2. Recent COVID Diagnosis - tested positive for 12/23. Negative this admission. Husband currently hospitalized at [facility].   3. Persistent Atrial Fibrillation - bradycardiac with HR in 40's to 50's on admission. On Lopressor 50mg  BID prior to admission which is currently held. Remains bradycardiac on telemetry with HR in the 30's to 40's. No indication for temp pacer at this time. Continue to follow on telemetry. Hopefully rates will improve with wash-out of BB therapy.  - INR 3.3 on admission. Will recheck with AM labs. Once INR ~ 1.7 or lower, would plan for cath as outlined above. Start Heparin once INR less than 2.0.  4. HTN - BP at 118/40  - 145/88 within the past 24 hours.  - continue Amlodipine 5mg  daily. HCTZ currently held given AKI and Lopressor held due to bradycardia.   5. AKI - creatinine 1.66 on admission, trending up to 1.81 today. Hold nephrotoxic agents. Start gentle hydration as outlined above.     For questions or updates, please contact [place] Please consult [email or web address] for contact info under Cardiology/STEMI.   Arna Medici , Arnold 8:34 AM 07/23/2019 Pager: [phone]   Attending note:  Patient seen and examined.  Case discussed with Katrina Arnold, I agree with her above findings and recommendations.  Katrina Arnold appears comfortable this morning, no active shortness of breath or chest discomfort.  I personally reviewed her telemetry which shows slow atrial fibrillation, heart rate 30s to 40s, Lopressor has been held.  No pauses.  Blood pressure also  stable with systolic in the [id number].  Lungs without crackles.  Cardiac exam with irregularly irregular rhythm and no gallop.  Lab work shows potassium 5.0, BUN 24, creatinine 1.81, peak high-sensitivity troponin I 578 down to 325, hemoglobin 12.1, INR 3.5.  ECG with slow atrial fibrillation, no acute ST segment changes.  Presentation is concerning for unstable angina/ACS given sudden onset and I wonder whether her bradycardia could be ischemia related.  Echocardiogram yesterday revealed LVEF 60 to 65% with basal inferior akinesis.  Ultimately, plan is diagnostic cardiac catheterization.  At present she is supratherapeutic with INR 3.5 and also has acute renal insufficiency with creatinine 1.81.  Continue to hold Coumadin, allow further washout of beta-blocker, gently hydrate with follow-up BMET.  Hold nephrotoxic agents.  Can start plans for transfer to [facility], although doubt she will be ready for cardiac catheterization for the next 24 to 48 hours.  Of note, she did test positive for COVID-19 in December 23, not obviously  symptomatic at this time and negative on repeat testing.  Satira Sark, M.D., F.A.C.C.

## 2019-07-24 DIAGNOSIS — E1121 Type 2 diabetes mellitus with diabetic nephropathy: Secondary | ICD-10-CM

## 2019-07-24 DIAGNOSIS — I209 Angina pectoris, unspecified: Secondary | ICD-10-CM | POA: Diagnosis not present

## 2019-07-24 DIAGNOSIS — I214 Non-ST elevation (NSTEMI) myocardial infarction: Secondary | ICD-10-CM | POA: Diagnosis not present

## 2019-07-24 DIAGNOSIS — I482 Chronic atrial fibrillation, unspecified: Secondary | ICD-10-CM

## 2019-07-24 DIAGNOSIS — N1832 Chronic kidney disease, stage 3b: Secondary | ICD-10-CM

## 2019-07-24 DIAGNOSIS — Z7901 Long term (current) use of anticoagulants: Secondary | ICD-10-CM | POA: Diagnosis not present

## 2019-07-24 DIAGNOSIS — N179 Acute kidney failure, unspecified: Secondary | ICD-10-CM

## 2019-07-24 DIAGNOSIS — I4891 Unspecified atrial fibrillation: Secondary | ICD-10-CM | POA: Diagnosis not present

## 2019-07-24 DIAGNOSIS — I10 Essential (primary) hypertension: Secondary | ICD-10-CM | POA: Diagnosis not present

## 2019-07-24 DIAGNOSIS — I200 Unstable angina: Secondary | ICD-10-CM

## 2019-07-24 DIAGNOSIS — I4819 Other persistent atrial fibrillation: Principal | ICD-10-CM

## 2019-07-24 LAB — CBC WITH DIFFERENTIAL/PLATELET
Abs Immature Granulocytes: 0.1 10*3/uL — ABNORMAL HIGH (ref 0.00–0.07)
Basophils Absolute: 0 10*3/uL (ref 0.0–0.1)
Basophils Relative: 0 %
Eosinophils Absolute: 0 10*3/uL (ref 0.0–0.5)
Eosinophils Relative: 0 %
HCT: 33.2 % — ABNORMAL LOW (ref 36.0–46.0)
Hemoglobin: 10.5 g/dL — ABNORMAL LOW (ref 12.0–15.0)
Immature Granulocytes: 1 %
Lymphocytes Relative: 26 %
Lymphs Abs: 2.2 10*3/uL (ref 0.7–4.0)
MCH: 27.3 pg (ref 26.0–34.0)
MCHC: 31.6 g/dL (ref 30.0–36.0)
MCV: 86.5 fL (ref 80.0–100.0)
Monocytes Absolute: 0.9 10*3/uL (ref 0.1–1.0)
Monocytes Relative: 10 %
Neutro Abs: 5.3 10*3/uL (ref 1.7–7.7)
Neutrophils Relative %: 63 %
Platelets: 402 10*3/uL — ABNORMAL HIGH (ref 150–400)
RBC: 3.84 MIL/uL — ABNORMAL LOW (ref 3.87–5.11)
RDW: 13.9 % (ref 11.5–15.5)
WBC: 8.6 10*3/uL (ref 4.0–10.5)
nRBC: 0.2 % (ref 0.0–0.2)

## 2019-07-24 LAB — COMPREHENSIVE METABOLIC PANEL
ALT: 11 U/L (ref 0–44)
AST: 21 U/L (ref 15–41)
Albumin: 3.1 g/dL — ABNORMAL LOW (ref 3.5–5.0)
Alkaline Phosphatase: 70 U/L (ref 38–126)
Anion gap: 14 (ref 5–15)
BUN: 26 mg/dL — ABNORMAL HIGH (ref 8–23)
CO2: 17 mmol/L — ABNORMAL LOW (ref 22–32)
Calcium: 8.5 mg/dL — ABNORMAL LOW (ref 8.9–10.3)
Chloride: 96 mmol/L — ABNORMAL LOW (ref 98–111)
Creatinine, Ser: 1.81 mg/dL — ABNORMAL HIGH (ref 0.44–1.00)
GFR calc Af Amer: 29 mL/min — ABNORMAL LOW (ref >60)
GFR calc non Af Amer: 25 mL/min — ABNORMAL LOW (ref >60)
Glucose, Bld: 180 mg/dL — ABNORMAL HIGH (ref 70–99)
Potassium: 4.8 mmol/L (ref 3.5–5.1)
Sodium: 127 mmol/L — ABNORMAL LOW (ref 135–145)
Total Bilirubin: 0.9 mg/dL (ref 0.3–1.2)
Total Protein: 6.9 g/dL (ref 6.5–8.1)

## 2019-07-24 LAB — MAGNESIUM: Magnesium: 1.8 mg/dL (ref 1.7–2.4)

## 2019-07-24 LAB — GLUCOSE, CAPILLARY
Glucose-Capillary: 188 mg/dL — ABNORMAL HIGH (ref 70–99)
Glucose-Capillary: 181 mg/dL — ABNORMAL HIGH (ref 70–99)
Glucose-Capillary: 169 mg/dL — ABNORMAL HIGH (ref 70–99)
Glucose-Capillary: 153 mg/dL — ABNORMAL HIGH (ref 70–99)

## 2019-07-24 LAB — C-REACTIVE PROTEIN: CRP: 0.9 mg/dL (ref <1.0)

## 2019-07-24 LAB — PROTIME-INR
INR: 3.9 — ABNORMAL HIGH (ref 0.8–1.2)
Prothrombin Time: 37.9 seconds — ABNORMAL HIGH (ref 11.4–15.2)

## 2019-07-24 LAB — PHOSPHORUS: Phosphorus: 3.1 mg/dL (ref 2.5–4.6)

## 2019-07-24 LAB — FERRITIN: Ferritin: 135 ng/mL (ref 11–307)

## 2019-07-24 LAB — D-DIMER, QUANTITATIVE: D-Dimer, Quant: 0.41 ug/mL-FEU (ref 0.00–0.50)

## 2019-07-24 MED ORDER — PHYTONADIONE 5 MG PO TABS
2.5000 mg | ORAL_TABLET | Freq: Once | ORAL | Status: AC
  Administered 2019-07-24: 2.5 mg via ORAL
  Filled 2019-07-24 (×2): qty 1

## 2019-07-24 MED ORDER — SODIUM CHLORIDE 0.9 % IV SOLN: INTRAVENOUS | Status: AC

## 2019-07-24 NOTE — Progress Notes (Signed)
ANTICOAGULATION CONSULT NOTE -   Pharmacy Consult for Heparin Indication: ACS/STEMI  No Known Allergies  Patient Measurements: Height: 5\' 2"  (157.5 cm) Weight: 163 lb 9.3 oz (74.2 kg) IBW/kg (Calculated) : 50.1 HEPARIN DW (KG): 65.9  Vital Signs: Temp: 98.2 F (36.8 C) (01/06 0525) Temp Source: Oral (01/06 0525) BP: 150/49 (01/06 0525) Pulse Rate: 43 (01/06 0525)  Labs: Recent Labs    08/13/2019 0559 07/26/2019 1107 07/21/2019 2144 07/23/19 0514 07/23/19 0900 07/24/19 0333  HGB 12.2  --   --  12.1  --  10.5*  HCT 37.4  --   --  38.1  --  33.2*  PLT 364  --   --  394  --  402*  LABPROT  --  33.2*  --   --  35.0* 37.9*  INR  --  3.3*  --   --  3.5* 3.9*  CREATININE 1.66*  --   --  1.81*  --  1.81*  TROPONINIHS 578* 506* 325*  --   --   --     Estimated Creatinine Clearance: 21.8 mL/min (A) (by C-G formula based on SCr of 1.81 mg/dL (H)).   Medical History: Past Medical History:  Diagnosis Date  . Atrial fibrillation, chronic ([place])   . Cerebrovascular disease    Left cerebral CVA 2007; residual right sided weakness; 7/09 mild plaque; no focal stenosis  . Chronic anticoagulation   . Chronic kidney disease, stage 3, mod decreased GFR    Creatinine of 1.5 in 10/2008; h/o hyper- and hypokalemia  . Colon adenocarcinoma ([place]) 2009   s/p right hemicolectomy  . Diabetes mellitus, type 2 ([place])   . Dupuytren's contracture   . Hyperlipidemia   . Hypertension     Medications:  Medications Prior to Admission  Medication Sig Dispense Refill Last Dose  . amLODipine (NORVASC) 5 MG tablet Take 1 tablet (5 mg total) by mouth daily. 30 tablet 6 07/21/2019 at Unknown time  . glipiZIDE (GLUCOTROL XL) 2.5 MG 24 hr tablet Take 2.5 mg by mouth daily.     07/21/2019 at Unknown time  . hydrochlorothiazide (MICROZIDE) 12.5 MG capsule Take 12.5 mg by mouth daily.   07/21/2019 at Unknown time  . metFORMIN (GLUCOPHAGE) 500 MG tablet Take 500 mg by mouth 2 (two) times daily with a meal.     07/21/2019  at Unknown time  . metoprolol (LOPRESSOR) 50 MG tablet Take 50 mg by mouth 2 (two) times daily.     07/21/2019 at 2130  . omeprazole (PRILOSEC) 20 MG capsule Take 20 mg by mouth daily.     07/21/2019 at Unknown time  . potassium chloride SA (K-DUR,KLOR-CON) 20 MEQ tablet TAKE 1 TABLET BY MOUTH EVERY DAY (Patient taking differently: Take 20 mEq by mouth daily. ) 30 tablet 0 07/21/2019 at Unknown time  . simvastatin (ZOCOR) 20 MG tablet Take 20 mg by mouth at bedtime.    07/21/2019 at Unknown time  . warfarin (COUMADIN) 5 MG tablet TAKE 1 TABLET BY MOUTH DAILY EXCEPT TAKE 1/2 TABLET ON MONDAYS, WEDNESDAYS AND FRIDAYS (Patient taking differently: Take 5 mg by mouth See admin instructions. TAKE 2.5 MG ON MONDAYS, WEDNESDAYS AND FRIDAYS. TAKE 5 MG ON OTHER DAYS) 90 tablet 1 07/21/2019 at Unknown time    Assessment: 86 yo female presented to ED with chest pain/dypsnea.No acute ST abnormalities by EKG but initial HS Troponin 578 with repeat values at 506 and 325.  She is chronically anticoagulated on coumadin for afib. INR this AM  is supratherapeutic at 3.9. pharmacy asked to transition to heparin once INR <2.  Goal of Therapy:  Heparin level 0.3-0.7 units/ml Monitor platelets by anticoagulation protocol: Yes   Plan:  Daily PT-INR Start heparin infusion at 800 units/hr once INR <2 Check anti-Xa level in 8 hours and daily while on heparin Continue to monitor H&H and platelets  Margot Ables, PharmD Clinical Pharmacist 07/24/2019 8:26 AM

## 2019-07-24 NOTE — Progress Notes (Signed)
PROGRESS NOTE  Katrina Arnold [id number] DOB: 1933-05-04 DOA: 07/21/2019 PCP: Rosita Fire, MD  Brief History:  86 year old female with a history of chronic atrial fibrillation on warfarin, hypertension, hyperlipidemia, diabetes mellitus type 2 presenting with chest discomfort and shortness of breath that woke her up from bed on the morning of 08/12/2019.  She had some associated nausea and emesis eventually.  She has some associated malaise with nausea and myalgias.  She was diagnosed with COVID-19 on 07/10/2019 and her husband is currently admitted to [facility] 2 days prior to this admission.  Regarding her COVID-19 diagnosis, the patient had been fairly asymptomatic prior to the onset of her current symptoms for this admission. The patient was found to have atrial fibrillation with slow ventricular response with heart rate as low as 30s.  Cardiology was consulted to assist with management.  Troponins were elevated with a peak of 578.  There was concern for ACS. Repeat SARS-CoV2 RT-PCR is negative.  Assessment/Plan: elevated troponin/Angina Pectoris -Concern for ACS -Troponins 578>>> 506>>> 325 -Appreciate cardiology consult and recommendations. -Optimizing renal function and coagulopathy prior to transfer for heart catheterization. -Bed requested for telemetry bed at [facility] today -Low-dose oral vitamin K given given the still elevated INR -Continue IV fluids to further stabilize her renal function. -case discussed with cardiology, Dr. Domenic Polite -Continue ASA 81 mg  Acute on chronic renal failure--CKD stage III -Baseline creatinine 1.1-1.4 -Serum creatinine peaked and is still 1.8 -Continue holding HCTZ -Continue gentle/judicious IV fluids.  Persistent atrial fibrillation with slow ventricular response -Continue holding metoprolol -Appreciate cardiology follow-up and recommendations. -Personally reviewed EKG--atrial fibrillation, nonspecific T wave  changes -Patient will be transferred to [facility] for further evaluation and management. -Denying palpitations or chest pain currently.  COVID-19 infection -Initially tested + 07/10/2019 -Repeat SARS-CoV2 RT-PCR is negative -CRP 1.2 -Ferritin 161 -D-dimer 0.37 -No respiratory complaints.  Coagulopathy -INR has trended up 3.3>>> 3.5>>>3.9 -While planning possible cath on 07/26/2019, 2.5 mg of vitamin K orally will be given. -Follow INR trend  Hyperlipidemia -Continue statin  Diabetes mellitus type 2 -Holding metformin and glipizide -Hemoglobin A1c 7.6 -Continue NovoLog sliding scale  Essential hypertension -Vital signs stable -Calcium channel blockers and beta-blockers discontinued in the setting of bradycardia.  Hyponatremia -due to volume depletion -Continue judicious IVF  Disposition Plan:   Transfer to [facility] telemetry bed for anticipated heart cath and further evaluation/management by cardiology service.   Family Communication:   No Family at bedside  Consultants:  cardiology  Code Status:  FULL   DVT Prophylaxis:  coumadin   Procedures: As Listed in Progress Note Above  Antibiotics: None    Subjective: No chest pain, no palpitations, no nausea, no vomiting, no shortness of breath.  Patient in no acute distress and no overnight events reported.   Objective: Vitals:   07/23/19 1412 07/23/19 2045 07/24/19 0500 07/24/19 0525  BP: (!) 146/50 (!) 146/41  (!) 150/49  Pulse: (!) 47 69  (!) 43  Resp: 20 18  18   Temp: 98.3 F (36.8 C) 99.2 F (37.3 C)  98.2 F (36.8 C)  TempSrc: Oral Oral  Oral  SpO2:  100%  100%  Weight:   74.2 kg   Height:        Intake/Output Summary (Last 24 hours) at 07/24/2019 1352 Last data filed at 07/24/2019 0900 Gross per 24 hour  Intake 561.05 ml  Output --  Net 561.05 ml  Weight change:    Exam: General exam: Alert, awake, oriented x 3.  Good oxygen saturation on room air; denies chest pain, no nausea, no  vomiting, no dizziness. Respiratory system: Clear to auscultation. Respiratory effort normal. Cardiovascular system: Sinus bradycardia, no rubs, no gallops, no JVD.   Gastrointestinal system: Abdomen is nondistended, soft and nontender. No organomegaly or masses felt. Normal bowel sounds heard. Central nervous system: Alert and oriented. No focal neurological deficits. Extremities: No C/C/E, +pedal pulses Skin: No rashes, lesions or ulcers Psychiatry: Judgement and insight appear normal. Mood & affect appropriate.    Data Reviewed: I have personally reviewed following labs and imaging studies.  Basic Metabolic Panel: Recent Labs  Lab 08/03/2019 0559 07/23/19 0514 07/24/19 0333  NA 129* 131* 127*  K 4.8 5.0 4.8  CL 96* 98 96*  CO2 20* 18* 17*  GLUCOSE 218* 165* 180*  BUN 16 24* 26*  CREATININE 1.66* 1.81* 1.81*  CALCIUM 9.0 9.0 8.5*  MG  --  1.9 1.8  PHOS  --  3.4 3.1   Liver Function Tests: Recent Labs  Lab 07/23/2019 0559 07/23/19 0514 07/24/19 0333  AST 28 21 21   ALT 13 11 11   ALKPHOS 83 77 70  BILITOT 0.6 0.8 0.9  PROT 7.8 7.5 6.9  ALBUMIN 3.4* 3.3* 3.1*   Coagulation Profile: Recent Labs  Lab 07/21/2019 1107 07/23/19 0900 07/24/19 0333  INR 3.3* 3.5* 3.9*   CBC: Recent Labs  Lab 07/21/2019 0559 07/23/19 0514 07/24/19 0333  WBC 8.1 7.9 8.6  NEUTROABS 5.7 4.6 5.3  HGB 12.2 12.1 10.5*  HCT 37.4 38.1 33.2*  MCV 86.4 87.0 86.5  PLT 364 394 402*   CBG: Recent Labs  Lab 07/23/19 1051 07/23/19 1602 07/23/19 2043 07/24/19 0726 07/24/19 1100  GLUCAP 194* 170* 180* 188* 181*   HbA1C: Recent Labs    07/23/19 0514  HGBA1C 7.6*   Urine analysis:    Component Value Date/Time   COLORURINE YELLOW 04/23/2017 0049   APPEARANCEUR HAZY (A) 04/23/2017 0049   LABSPEC 1.021 04/23/2017 0049   PHURINE 5.0 04/23/2017 0049   GLUCOSEU NEGATIVE 04/23/2017 0049   GLUCOSEU NEG mg/dL 07/06/2009 1903   HGBUR MODERATE (A) 04/23/2017 0049   BILIRUBINUR NEGATIVE  04/23/2017 [place] 04/23/2017 0049   PROTEINUR 100 (A) 04/23/2017 0049   UROBILINOGEN 1 07/06/2009 1903   NITRITE NEGATIVE 04/23/2017 0049   LEUKOCYTESUR NEGATIVE 04/23/2017 0049    Recent Results (from the past 240 hour(s))  Respiratory Panel by RT PCR (Flu A&B, Covid) - Nasopharyngeal Swab     Status: None   Collection Time: 08/01/2019  8:42 AM   Specimen: Nasopharyngeal Swab  Result Value Ref Range Status   SARS Coronavirus 2 by RT PCR NEGATIVE NEGATIVE Final    Comment: (NOTE) SARS-CoV-2 target nucleic acids are NOT DETECTED. The SARS-CoV-2 RNA is generally detectable in upper respiratoy specimens during the acute phase of infection. The lowest concentration of SARS-CoV-2 viral copies this assay can detect is 131 copies/mL. A negative result does not preclude SARS-Cov-2 infection and should not be used as the sole basis for treatment or other patient management decisions. A negative result may occur with  improper specimen collection/handling, submission of specimen other than nasopharyngeal swab, presence of viral mutation(s) within the areas targeted by this assay, and inadequate number of viral copies (<131 copies/mL). A negative result must be combined with clinical observations, patient history, and epidemiological information. The expected result is Negative. Fact Sheet for Patients:  PinkCheek.be Fact Sheet for Healthcare Providers:  GravelBags.it This test is not yet ap proved or cleared by the [place] FDA and  has been authorized for detection and/or diagnosis of SARS-CoV-2 by FDA under an Emergency Use Authorization (EUA). This EUA will remain  in effect (meaning this test can be used) for the duration of the COVID-19 declaration under Section 564(b)(1) of the Act, 21 U.S.C. section 360bbb-3(b)(1), unless the authorization is terminated or revoked sooner.    Influenza A by PCR NEGATIVE  NEGATIVE Final   Influenza B by PCR NEGATIVE NEGATIVE Final    Comment: (NOTE) The Xpert Xpress SARS-CoV-2/FLU/RSV assay is intended as an aid in  the diagnosis of influenza from Nasopharyngeal swab specimens and  should not be used as a sole basis for treatment. Nasal washings and  aspirates are unacceptable for Xpert Xpress SARS-CoV-2/FLU/RSV  testing. Fact Sheet for Patients: PinkCheek.be Fact Sheet for Healthcare Providers: GravelBags.it This test is not yet approved or cleared by the [place] FDA and  has been authorized for detection and/or diagnosis of SARS-CoV-2 by  FDA under an Emergency Use Authorization (EUA). This EUA will remain  in effect (meaning this test can be used) for the duration of the  Covid-19 declaration under Section 564(b)(1) of the Act, 21  U.S.C. section 360bbb-3(b)(1), unless the authorization is  terminated or revoked. Performed at [facility], [address] [place], [place] [address]      Scheduled Meds: . albuterol  2 puff Inhalation Q6H  . amLODipine  5 mg Oral Daily  . vitamin C  500 mg Oral Daily  . aspirin EC  81 mg Oral Daily  . insulin aspart  0-5 Units Subcutaneous QHS  . insulin aspart  0-9 Units Subcutaneous TID WC  . multivitamin with minerals  1 tablet Oral Daily  . pantoprazole  40 mg Oral Daily  . simvastatin  20 mg Oral QHS  . sodium chloride flush  3 mL Intravenous Q12H  . zinc sulfate  220 mg Oral Daily   Continuous Infusions: . sodium chloride    . sodium chloride 75 mL/hr at 07/24/19 [id number]    Procedures/Studies: DG Chest Port 1 View  Result Date: 08/08/2019 CLINICAL DATA:  Vomiting and shortness of breath. EXAM: PORTABLE CHEST 1 VIEW COMPARISON:  01/09/2008 FINDINGS: Normal heart size and mediastinal contours. There is no edema, consolidation, effusion, or pneumothorax. No osseous findings. IMPRESSION: No evidence of active disease. Electronically Signed   By:  Monte Fantasia M.D.   On: 08/12/2019 06:03   ECHOCARDIOGRAM COMPLETE  Result Date: 08/01/2019   ECHOCARDIOGRAM REPORT   Patient Name:   Katrina Arnold Date of Exam: 08/15/2019 Medical Rec #:  [id number]    Height:       62.0 in Accession #:    [id number]   Weight:       162.0 lb Date of Birth:  [birth year]    BSA:          1.75 m Patient Age:    79 years     BP:           144/67 mmHg Patient Gender: F            HR:           40 bpm. Exam Location:  [facility] Procedure: 2D Echo, Cardiac Doppler and Color Doppler Indications:    Chest Pain 786.50 / R07.9  History:        Patient has prior history of  Echocardiogram examinations, most                 recent 01/14/2008. Arrythmias:Atrial Fibrillation; Risk                 Factors:Hypertension, Diabetes and Dyslipidemia. Chronic                 anticoagulation,Hx of Adenocarcinoma of colon.  Sonographer:    BW Referring Phys: [place]  1. Left ventricular ejection fraction, by visual estimation, is 60 to 65%. The left ventricle has normal function. There is mildly increased left ventricular hypertrophy.  2. Left ventricular diastolic parameters are indeterminate.  3. The left ventricle demonstrates regional wall motion abnormalities. Apparent inferior basal akinesis.  4. Global right ventricle has normal systolic function.The right ventricular size is normal. No increase in right ventricular wall thickness.  5. Left atrial size was severely dilated.  6. Right atrial size was moderately dilated.  7. The mitral valve is grossly normal. Moderate mitral valve regurgitation.  8. The tricuspid valve is grossly normal.  9. The aortic valve is tricuspid. Aortic valve regurgitation is mild. 10. The pulmonic valve was grossly normal. Pulmonic valve regurgitation is trivial. 11. Moderately elevated pulmonary artery systolic pressure. 12. The inferior vena cava is normal in size with greater than 50% respiratory variability, suggesting right atrial pressure of  3 mmHg. 13. Evidence of atrial level shunting detected by color flow Doppler. 14. The tricuspid regurgitant velocity is 3.23 m/s, and with an assumed right atrial pressure of 3 mmHg, the estimated right ventricular systolic pressure is moderately elevated at 44.7 mmHg. FINDINGS  Left Ventricle: Left ventricular ejection fraction, by visual estimation, is 60 to 65%. The left ventricle has normal function. The left ventricle demonstrates regional wall motion abnormalities. The left ventricular internal cavity size was the left ventricle is normal in size. There is mildly increased left ventricular hypertrophy. Left ventricular diastolic parameters are indeterminate. Right Ventricle: The right ventricular size is normal. No increase in right ventricular wall thickness. Global RV systolic function is has normal systolic function. The tricuspid regurgitant velocity is 3.23 m/s, and with an assumed right atrial pressure  of 3 mmHg, the estimated right ventricular systolic pressure is moderately elevated at 44.7 mmHg. Left Atrium: Left atrial size was severely dilated. Right Atrium: Right atrial size was moderately dilated Pericardium: There is no evidence of pericardial effusion. Mitral Valve: The mitral valve is grossly normal. Moderate mitral valve regurgitation. Tricuspid Valve: The tricuspid valve is grossly normal. Tricuspid valve regurgitation moderate. Aortic Valve: The aortic valve is tricuspid. Aortic valve regurgitation is mild. Mild aortic valve annular calcification. Pulmonic Valve: The pulmonic valve was grossly normal. Pulmonic valve regurgitation is trivial. Pulmonic regurgitation is trivial. Aorta: The aortic root is normal in size and structure. Venous: The inferior vena cava is normal in size with greater than 50% respiratory variability, suggesting right atrial pressure of 3 mmHg. IAS/Shunts: Evidence of atrial level shunting detected by color flow Doppler.  LEFT VENTRICLE PLAX 2D LVIDd:         3.36 cm  LVIDs:         1.95 cm LV PW:         1.02 cm LV IVS:        1.13 cm LVOT diam:     1.60 cm LV SV:         34 ml LV SV Index:   18.79 LVOT Area:     2.01 cm  RIGHT VENTRICLE TAPSE (M-mode): 1.9 cm LEFT ATRIUM             Index       RIGHT ATRIUM           Index LA diam:        3.30 cm 1.89 cm/m  RA Area:     24.00 cm LA Vol (A2C):   78.6 ml 44.97 ml/m RA Volume:   79.80 ml  45.65 ml/m LA Vol (A4C):   95.1 ml 54.41 ml/m LA Biplane Vol: 88.4 ml 50.57 ml/m  AORTIC VALVE LVOT Vmax:   86.15 cm/s LVOT Vmean:  60.850 cm/s LVOT VTI:    0.211 m  AORTA Ao Root diam: 3.30 cm MITRAL VALVE                         TRICUSPID VALVE MV Area (PHT): 3.31 cm              TR Peak grad:   41.7 mmHg MV PHT:        66.41 msec            TR Vmax:        323.00 cm/s MV Decel Time: 229 msec MV E velocity: 117.00 cm/s 103 cm/s  SHUNTS MV A velocity: 21.40 cm/s  70.3 cm/s Systemic VTI:  0.21 m MV E/A ratio:  5.47        1.5       Systemic Diam: 1.60 cm  Rozann Lesches MD Electronically signed by Rozann Lesches MD Signature Date/Time: 08/16/2019/2:50:08 PM    Final     Barton Dubois, MD  [facility] Pager [phone]  07/24/2019, 1:52 PM   LOS: 2 days

## 2019-07-24 NOTE — Plan of Care (Signed)

## 2019-07-24 NOTE — Progress Notes (Addendum)
Progress Note  Patient Name: Katrina Arnold Date of Encounter: 07/24/2019  Primary Cardiologist: Carlyle Dolly, MD   Subjective   She denies any chest pain overnight. No recurrent dizziness. Says she "just wants to go home".   Inpatient Medications    Scheduled Meds: . albuterol  2 puff Inhalation Q6H  . amLODipine  5 mg Oral Daily  . vitamin C  500 mg Oral Daily  . aspirin EC  81 mg Oral Daily  . insulin aspart  0-5 Units Subcutaneous QHS  . insulin aspart  0-9 Units Subcutaneous TID WC  . multivitamin with minerals  1 tablet Oral Daily  . pantoprazole  40 mg Oral Daily  . phytonadione  2.5 mg Oral Once  . simvastatin  20 mg Oral QHS  . sodium chloride flush  3 mL Intravenous Q12H  . zinc sulfate  220 mg Oral Daily   Continuous Infusions: . sodium chloride    . sodium chloride 75 mL/hr at 07/24/19 0954   PRN Meds: sodium chloride, acetaminophen **OR** acetaminophen, chlorpheniramine-HYDROcodone, guaiFENesin-dextromethorphan, ondansetron **OR** ondansetron (ZOFRAN) IV, polyethylene glycol, sodium chloride flush, traZODone   Vital Signs    Vitals:   07/23/19 1412 07/23/19 2045 07/24/19 0500 07/24/19 0525  BP: (!) 146/50 (!) 146/41  (!) 150/49  Pulse: (!) 47 69  (!) 43  Resp: 20 18  18   Temp: 98.3 F (36.8 C) 99.2 F (37.3 C)  98.2 F (36.8 C)  TempSrc: Oral Oral  Oral  SpO2:  100%  100%  Weight:   74.2 kg   Height:        Intake/Output Summary (Last 24 hours) at 07/24/2019 0958 Last data filed at 07/23/2019 1759 Gross per 24 hour  Intake 561.05 ml  Output --  Net 561.05 ml    Last 3 Weights 07/24/2019 08/03/2019 06/05/2019  Weight (lbs) 163 lb 9.3 oz 162 lb 160 lb  Weight (kg) 74.2 kg 73.483 kg 72.576 kg      Telemetry    Atrial fibrillation, HR variable in 30's to 60's. Pauses up to 2.7 seconds.  - Personally Reviewed  ECG    Atrial fibrillation with slow ventricular response, HR 48 with LAD.  - Personally Reviewed  Physical Exam   General: Well  developed, well nourished, female appearing in no acute distress. Head: Normocephalic, atraumatic.  Neck: Supple without bruits, JVD not elevated. Lungs:  Resp regular and unlabored, CTA without wheezing or rales. Heart: Irregularly irregular, S1, S2, no S3, S4, or murmur; no rub. Abdomen: Soft, non-tender, non-distended with normoactive bowel sounds. No hepatomegaly. No rebound/guarding. No obvious abdominal masses. Extremities: No clubbing, cyanosis, or lower extremity edema. Distal pedal pulses are 2+ bilaterally. Neuro: Alert and oriented X 3. Moves all extremities spontaneously. Psych: Normal affect.  Labs    Chemistry Recent Labs  Lab 08/04/2019 0559 07/23/19 0514 07/24/19 0333  NA 129* 131* 127*  K 4.8 5.0 4.8  CL 96* 98 96*  CO2 20* 18* 17*  GLUCOSE 218* 165* 180*  BUN 16 24* 26*  CREATININE 1.66* 1.81* 1.81*  CALCIUM 9.0 9.0 8.5*  PROT 7.8 7.5 6.9  ALBUMIN 3.4* 3.3* 3.1*  AST 28 21 21   ALT 13 11 11   ALKPHOS 83 77 70  BILITOT 0.6 0.8 0.9  GFRNONAA 28* 25* 25*  GFRAA 32* 29* 29*  ANIONGAP 13 15 14      Hematology Recent Labs  Lab 08/09/2019 0559 07/23/19 0514 07/24/19 0333  WBC 8.1 7.9 8.6  RBC 4.33 4.38  3.84*  HGB 12.2 12.1 10.5*  HCT 37.4 38.1 33.2*  MCV 86.4 87.0 86.5  MCH 28.2 27.6 27.3  MCHC 32.6 31.8 31.6  RDW 13.6 13.6 13.9  PLT 364 394 402*    BNP Recent Labs  Lab 08/03/2019 0559  BNP 259.0*     DDimer  Recent Labs  Lab 07/23/19 0514 07/24/19 0333  DDIMER 0.37 0.41     Radiology    ECHOCARDIOGRAM COMPLETE  Result Date: 07/29/2019   ECHOCARDIOGRAM REPORT   Patient Name:   [name] Date of Exam:  Medical Rec #:  [id number]    Height:       62.0 in Accession #:    [id number]   Weight:       162.0 lb Date of Birth:  01-Jul-1933    BSA:          1.75 m Patient Age:    71 years     BP:           144/67 mmHg Patient Gender: F            HR:           40 bpm. Exam Location:  [facility] Procedure: 2D Echo, Cardiac Doppler and Color  Doppler Indications:    Chest Pain 786.50 / R07.9  History:        Patient has prior history of Echocardiogram examinations, most                 recent 01/14/2008. Arrythmias:Atrial Fibrillation; Risk                 Factors:Hypertension, Diabetes and Dyslipidemia. Chronic                 anticoagulation,Hx of Adenocarcinoma of colon.  Sonographer:    BW Referring Phys: Gisela  1. Left ventricular ejection fraction, by visual estimation, is 60 to 65%. The left ventricle has normal function. There is mildly increased left ventricular hypertrophy.  2. Left ventricular diastolic parameters are indeterminate.  3. The left ventricle demonstrates regional wall motion abnormalities. Apparent inferior basal akinesis.  4. Global right ventricle has normal systolic function.The right ventricular size is normal. No increase in right ventricular wall thickness.  5. Left atrial size was severely dilated.  6. Right atrial size was moderately dilated.  7. The mitral valve is grossly normal. Moderate mitral valve regurgitation.  8. The tricuspid valve is grossly normal.  9. The aortic valve is tricuspid. Aortic valve regurgitation is mild. 10. The pulmonic valve was grossly normal. Pulmonic valve regurgitation is trivial. 11. Moderately elevated pulmonary artery systolic pressure. 12. The inferior vena cava is normal in size with greater than 50% respiratory variability, suggesting right atrial pressure of 3 mmHg. 13. Evidence of atrial level shunting detected by color flow Doppler. 14. The tricuspid regurgitant velocity is 3.23 m/s, and with an assumed right atrial pressure of 3 mmHg, the estimated right ventricular systolic pressure is moderately elevated at 44.7 mmHg. FINDINGS  Left Ventricle: Left ventricular ejection fraction, by visual estimation, is 60 to 65%. The left ventricle has normal function. The left ventricle demonstrates regional wall motion abnormalities. The left ventricular internal  cavity size was the left ventricle is normal in size. There is mildly increased left ventricular hypertrophy. Left ventricular diastolic parameters are indeterminate. Right Ventricle: The right ventricular size is normal. No increase in right ventricular wall thickness. Global RV systolic function is has normal systolic function. The  tricuspid regurgitant velocity is 3.23 m/s, and with an assumed right atrial pressure  of 3 mmHg, the estimated right ventricular systolic pressure is moderately elevated at 44.7 mmHg. Left Atrium: Left atrial size was severely dilated. Right Atrium: Right atrial size was moderately dilated Pericardium: There is no evidence of pericardial effusion. Mitral Valve: The mitral valve is grossly normal. Moderate mitral valve regurgitation. Tricuspid Valve: The tricuspid valve is grossly normal. Tricuspid valve regurgitation moderate. Aortic Valve: The aortic valve is tricuspid. Aortic valve regurgitation is mild. Mild aortic valve annular calcification. Pulmonic Valve: The pulmonic valve was grossly normal. Pulmonic valve regurgitation is trivial. Pulmonic regurgitation is trivial. Aorta: The aortic root is normal in size and structure. Venous: The inferior vena cava is normal in size with greater than 50% respiratory variability, suggesting right atrial pressure of 3 mmHg. IAS/Shunts: Evidence of atrial level shunting detected by color flow Doppler.  LEFT VENTRICLE PLAX 2D LVIDd:         3.36 cm LVIDs:         1.95 cm LV PW:         1.02 cm LV IVS:        1.13 cm LVOT diam:     1.60 cm LV SV:         34 ml LV SV Index:   18.79 LVOT Area:     2.01 cm  RIGHT VENTRICLE TAPSE (M-mode): 1.9 cm LEFT ATRIUM             Index       RIGHT ATRIUM           Index LA diam:        3.30 cm 1.89 cm/m  RA Area:     24.00 cm LA Vol (A2C):   78.6 ml 44.97 ml/m RA Volume:   79.80 ml  45.65 ml/m LA Vol (A4C):   95.1 ml 54.41 ml/m LA Biplane Vol: 88.4 ml 50.57 ml/m  AORTIC VALVE LVOT Vmax:   86.15 cm/s  LVOT Vmean:  60.850 cm/s LVOT VTI:    0.211 m  AORTA Ao Root diam: 3.30 cm MITRAL VALVE                         TRICUSPID VALVE MV Area (PHT): 3.31 cm              TR Peak grad:   41.7 mmHg MV PHT:        66.41 msec            TR Vmax:        323.00 cm/s MV Decel Time: 229 msec MV E velocity: 117.00 cm/s 103 cm/s  SHUNTS MV A velocity: 21.40 cm/s  70.3 cm/s Systemic VTI:  0.21 m MV E/A ratio:  5.47        1.5       Systemic Diam: 1.60 cm  Rozann Lesches MD Electronically signed by Rozann Lesches MD Signature Date/Time: 07/29/2019/2:50:08 PM    Final     Cardiac Studies   Echocardiogram: 07/20/2019 IMPRESSIONS   1. Left ventricular ejection fraction, by visual estimation, is 60 to 65%. The left ventricle has normal function. There is mildly increased left ventricular hypertrophy.  2. Left ventricular diastolic parameters are indeterminate.  3. The left ventricle demonstrates regional wall motion abnormalities. Apparent inferior basal akinesis.  4. Global right ventricle has normal systolic function.The right ventricular size is normal. No increase in right ventricular wall thickness.  5. Left atrial size was severely dilated.  6. Right atrial size was moderately dilated.  7. The mitral valve is grossly normal. Moderate mitral valve regurgitation.  8. The tricuspid valve is grossly normal.  9. The aortic valve is tricuspid. Aortic valve regurgitation is mild. 10. The pulmonic valve was grossly normal. Pulmonic valve regurgitation is trivial. 11. Moderately elevated pulmonary artery systolic pressure. 12. The inferior vena cava is normal in size with greater than 50% respiratory variability, suggesting right atrial pressure of 3 mmHg. 13. Evidence of atrial level shunting detected by color flow Doppler. 14. The tricuspid regurgitant velocity is 3.23 m/s, and with an assumed right atrial pressure of 3 mmHg, the estimated right ventricular systolic pressure is moderately elevated at 44.7  mmHg.  Patient Profile     86 y.o. female w/ PMH of persistent atrial fibrillation (on Coumadin), HTN, HLD and Type 2 DM who presented to [facility] ED on 07/21/2018 for evaluation of chest pain and dyspnea. Positive for COVID on 07/10/2019.  Assessment & Plan    1. Chest Pain/ Dyspnea on Exertion - occurring the day prior to admission with associated nausea and vomiting. No acute ST abnormalities by EKG but initial HS Troponin 578 with repeat values at 506 and 325. - echocardiogram showed a preserved EF of 60-65% with inferior basal akinesis.  - symptoms thought to be concerning for ACS. The plan is for a cardiac catheterization for definitive evaluation once renal function improves and INR is in an acceptable range. INR at 3.9 today despite Coumadin being held. Reviewed with Dr. Domenic Polite and will dose Vitamin K 2.5mg . Repeat INR in AM.  - continue ASA and statin therapy. BB held given bradycardia.   2. Recent COVID Diagnosis - tested positive for 12/23 and restested this admission and negative. Her husband is currently admitted to [facility].  - confirmed with the cath lab she would be able to have a catheterization this admission once medically ready.   3. Persistent Atrial Fibrillation - PTA Lopressor 50mg  BID was held at the time of admission due to bradycardia. HR remains low and has been variable from 30's to 60's. She does have pauses up to 2.7 seconds. BB therapy has been held for over 48 hours. Reviewed with Dr. Domenic Polite and it is thought her bradycardia might be ischemic-related. Continue to follow on telemetry.   - INR 3.3 on admission and trending up to 3.5 yesterday and 3.9 today. Coumadin has been held. LFT's WNL. Start Heparin once INR less than 2.0.  4. HTN - BP has been elevated at 146/61 - 150/50 within the past 24 hours. Continue Amlodipine 5mg  daily which can be titrated to 10mg  daily pending BP trend. Lopressor discontinued given bradycardia and HCTZ held due to rising  creatinine.   5. AKI - creatinine 1.66 on admission, trending up to 1.81 yesterday and stable at 1.81 today. She is not currently on nephrotoxic agents. We started IVF at 50 mL/hr yesterday and this was titrated to 75 mL/hr this AM by the admitting team.   For questions or updates, please contact [place] Please consult [email or web address] for contact info under Cardiology/STEMI.   Arna Medici , PA-C 9:58 AM 07/24/2019 Pager: [phone]   Attending note:  Patient seen and examined.  Case reviewed with Ms. Ahmed Prima PA-C, I agree with her above findings and recommendations.  Ms. Cohill does not report any recurrent chest pain or shortness of breath at rest.  She awaits transfer to [facility]  in anticipation ultimately of a diagnostic cardiac catheterization given concerns for unstable angina.    She remains in slow atrial fibrillation, beta-blocker has been discontinued.  Lab work shows creatinine 1.81 with history of CKD stage III at baseline, she has been gently hydrated.  Coumadin is on hold, however INR actually increasing, we will plan to give her a dose of vitamin K 2.5 mg.  Heparin can be initiated as bridge once INR less than 2.0.  Satira Sark, M.D., F.A.C.C.

## 2019-07-25 DIAGNOSIS — I10 Essential (primary) hypertension: Secondary | ICD-10-CM | POA: Diagnosis not present

## 2019-07-25 DIAGNOSIS — Z7901 Long term (current) use of anticoagulants: Secondary | ICD-10-CM | POA: Diagnosis not present

## 2019-07-25 DIAGNOSIS — I4891 Unspecified atrial fibrillation: Secondary | ICD-10-CM | POA: Diagnosis not present

## 2019-07-25 DIAGNOSIS — I214 Non-ST elevation (NSTEMI) myocardial infarction: Secondary | ICD-10-CM | POA: Diagnosis not present

## 2019-07-25 DIAGNOSIS — I482 Chronic atrial fibrillation, unspecified: Secondary | ICD-10-CM | POA: Diagnosis not present

## 2019-07-25 DIAGNOSIS — N179 Acute kidney failure, unspecified: Secondary | ICD-10-CM | POA: Diagnosis not present

## 2019-07-25 DIAGNOSIS — E1121 Type 2 diabetes mellitus with diabetic nephropathy: Secondary | ICD-10-CM | POA: Diagnosis not present

## 2019-07-25 DIAGNOSIS — I209 Angina pectoris, unspecified: Secondary | ICD-10-CM | POA: Diagnosis not present

## 2019-07-25 DIAGNOSIS — N1832 Chronic kidney disease, stage 3b: Secondary | ICD-10-CM | POA: Diagnosis not present

## 2019-07-25 LAB — GLUCOSE, CAPILLARY
Glucose-Capillary: 170 mg/dL — ABNORMAL HIGH (ref 70–99)
Glucose-Capillary: 198 mg/dL — ABNORMAL HIGH (ref 70–99)
Glucose-Capillary: 203 mg/dL — ABNORMAL HIGH (ref 70–99)
Glucose-Capillary: 164 mg/dL — ABNORMAL HIGH (ref 70–99)

## 2019-07-25 LAB — CBC WITH DIFFERENTIAL/PLATELET
Abs Immature Granulocytes: 0.12 10*3/uL — ABNORMAL HIGH (ref 0.00–0.07)
Basophils Absolute: 0 10*3/uL (ref 0.0–0.1)
Basophils Relative: 0 %
Eosinophils Absolute: 0 10*3/uL (ref 0.0–0.5)
Eosinophils Relative: 0 %
HCT: 34.5 % — ABNORMAL LOW (ref 36.0–46.0)
Hemoglobin: 10.7 g/dL — ABNORMAL LOW (ref 12.0–15.0)
Immature Granulocytes: 1 %
Lymphocytes Relative: 14 %
Lymphs Abs: 1.3 10*3/uL (ref 0.7–4.0)
MCH: 27.9 pg (ref 26.0–34.0)
MCHC: 31 g/dL (ref 30.0–36.0)
MCV: 89.8 fL (ref 80.0–100.0)
Monocytes Absolute: 0.8 10*3/uL (ref 0.1–1.0)
Monocytes Relative: 8 %
Neutro Abs: 7.4 10*3/uL (ref 1.7–7.7)
Neutrophils Relative %: 77 %
Platelets: 372 10*3/uL (ref 150–400)
RBC: 3.84 MIL/uL — ABNORMAL LOW (ref 3.87–5.11)
RDW: 14.2 % (ref 11.5–15.5)
WBC: 9.6 10*3/uL (ref 4.0–10.5)
nRBC: 0.2 % (ref 0.0–0.2)

## 2019-07-25 LAB — COMPREHENSIVE METABOLIC PANEL
ALT: 14 U/L (ref 0–44)
AST: 31 U/L (ref 15–41)
Albumin: 3.1 g/dL — ABNORMAL LOW (ref 3.5–5.0)
Alkaline Phosphatase: 88 U/L (ref 38–126)
Anion gap: 13 (ref 5–15)
BUN: 20 mg/dL (ref 8–23)
CO2: 16 mmol/L — ABNORMAL LOW (ref 22–32)
Calcium: 8.4 mg/dL — ABNORMAL LOW (ref 8.9–10.3)
Chloride: 95 mmol/L — ABNORMAL LOW (ref 98–111)
Creatinine, Ser: 1.42 mg/dL — ABNORMAL HIGH (ref 0.44–1.00)
GFR calc Af Amer: 39 mL/min — ABNORMAL LOW (ref >60)
GFR calc non Af Amer: 34 mL/min — ABNORMAL LOW (ref >60)
Glucose, Bld: 194 mg/dL — ABNORMAL HIGH (ref 70–99)
Potassium: 5.1 mmol/L (ref 3.5–5.1)
Sodium: 124 mmol/L — ABNORMAL LOW (ref 135–145)
Total Bilirubin: 1.2 mg/dL (ref 0.3–1.2)
Total Protein: 6.7 g/dL (ref 6.5–8.1)

## 2019-07-25 LAB — PROTIME-INR
INR: 2.7 — ABNORMAL HIGH (ref 0.8–1.2)
Prothrombin Time: 28.8 seconds — ABNORMAL HIGH (ref 11.4–15.2)

## 2019-07-25 LAB — MAGNESIUM: Magnesium: 1.8 mg/dL (ref 1.7–2.4)

## 2019-07-25 MED ORDER — PHYTONADIONE 5 MG PO TABS
2.5000 mg | ORAL_TABLET | Freq: Once | ORAL | Status: AC
  Administered 2019-07-25: 2.5 mg via ORAL
  Filled 2019-07-25: qty 1

## 2019-07-25 MED ORDER — AMLODIPINE BESYLATE 5 MG PO TABS
7.5000 mg | ORAL_TABLET | Freq: Every day | ORAL | Status: DC
  Administered 2019-07-26: 7.5 mg via ORAL
  Filled 2019-07-25 (×2): qty 2

## 2019-07-25 MED ORDER — AMLODIPINE BESYLATE 5 MG PO TABS: 10.0000 mg | ORAL_TABLET | Freq: Every day | ORAL | Status: DC

## 2019-07-25 MED ORDER — SODIUM CHLORIDE 0.9% FLUSH
3.0000 mL | Freq: Two times a day (BID) | INTRAVENOUS | Status: DC
  Administered 2019-07-26 – 2019-07-29 (×7): 3 mL via INTRAVENOUS

## 2019-07-25 NOTE — Progress Notes (Addendum)
Progress Note  Patient Name: Katrina Arnold Date of Encounter: 07/25/2019  Primary Cardiologist: Carlyle Dolly, MD   Subjective   No recurrent chest pain or dyspnea. Anxious about still being in the hospital.   Inpatient Medications    Scheduled Meds: . albuterol  2 puff Inhalation Q6H  . amLODipine  5 mg Oral Daily  . vitamin C  500 mg Oral Daily  . aspirin EC  81 mg Oral Daily  . insulin aspart  0-5 Units Subcutaneous QHS  . insulin aspart  0-9 Units Subcutaneous TID WC  . multivitamin with minerals  1 tablet Oral Daily  . pantoprazole  40 mg Oral Daily  . phytonadione  2.5 mg Oral Once  . simvastatin  20 mg Oral QHS  . sodium chloride flush  3 mL Intravenous Q12H  . zinc sulfate  220 mg Oral Daily   Continuous Infusions: . sodium chloride     PRN Meds: sodium chloride, acetaminophen **OR** acetaminophen, chlorpheniramine-HYDROcodone, guaiFENesin-dextromethorphan, ondansetron **OR** ondansetron (ZOFRAN) IV, polyethylene glycol, sodium chloride flush, traZODone   Vital Signs    Vitals:   07/24/19 1400 07/24/19 2109 07/25/19 0500 07/25/19 0519  BP: (!) 128/46 (!) 112/96  (!) 171/45  Pulse: (!) 36 (!) 39  (!) 38  Resp: (!) 26 16  20   Temp: 98.9 F (37.2 C) 98.9 F (37.2 C)  98.3 F (36.8 C)  TempSrc: Oral Oral  Oral  SpO2: 97% 99%  100%  Weight:   73.5 kg   Height:        Intake/Output Summary (Last 24 hours) at 07/25/2019 1008 Last data filed at 07/24/2019 1700 Gross per 24 hour  Intake 615.77 ml  Output -  Net 615.77 ml    Last 3 Weights 07/25/2019 07/24/2019 08/08/2019  Weight (lbs) 162 lb 0.6 oz 163 lb 9.3 oz 162 lb  Weight (kg) 73.5 kg 74.2 kg 73.483 kg      Telemetry    Atrial fibrillation, HR in 30's to 40's overnight with pauses up to 2.62 seconds. Currently in the 60's.  - Personally Reviewed  ECG    Atrial fibrillation with slow ventricular response, HR 54. No acute ST changes. - Personally Reviewed  Physical Exam   General: Well developed,  well nourished, female appearing in no acute distress. Head: Normocephalic, atraumatic.  Neck: Supple without bruits, JVD not elevated. Lungs:  Resp regular and unlabored, CTA without wheezing or rales. Heart: Irregularly irregular, S1, S2, no S3, S4, or murmur; no rub. Abdomen: Soft, non-tender, non-distended with normoactive bowel sounds. No hepatomegaly. No rebound/guarding. No obvious abdominal masses. Extremities: No clubbing, cyanosis, or lower extremity edema. Distal pedal pulses are 2+ bilaterally. Neuro: Alert and oriented X 3. Moves all extremities spontaneously. Psych: Normal affect.  Labs    Chemistry Recent Labs  Lab 07/23/19 0514 07/24/19 0333 07/25/19 0804  NA 131* 127* 124*  K 5.0 4.8 5.1  CL 98 96* 95*  CO2 18* 17* 16*  GLUCOSE 165* 180* 194*  BUN 24* 26* 20  CREATININE 1.81* 1.81* 1.42*  CALCIUM 9.0 8.5* 8.4*  PROT 7.5 6.9 6.7  ALBUMIN 3.3* 3.1* 3.1*  AST 21 21 31   ALT 11 11 14   ALKPHOS 77 70 88  BILITOT 0.8 0.9 1.2  GFRNONAA 25* 25* 34*  GFRAA 29* 29* 39*  ANIONGAP 15 14 13      Hematology Recent Labs  Lab 07/23/19 0514 07/24/19 0333 07/25/19 0804  WBC 7.9 8.6 9.6  RBC 4.38 3.84* 3.84*  HGB 12.1 10.5* 10.7*  HCT 38.1 33.2* 34.5*  MCV 87.0 86.5 89.8  MCH 27.6 27.3 27.9  MCHC 31.8 31.6 31.0  RDW 13.6 13.9 14.2  PLT 394 402* 372    Cardiac EnzymesNo results for input(s): TROPONINI in the last 168 hours. No results for input(s): TROPIPOC in the last 168 hours.   BNP Recent Labs  Lab 07/29/2019 0559  BNP 259.0*     DDimer  Recent Labs  Lab 07/23/19 0514 07/24/19 0333  DDIMER 0.37 0.41     Radiology    No results found.  Cardiac Studies   Echocardiogram: 08/17/2019 IMPRESSIONS    1. Left ventricular ejection fraction, by visual estimation, is 60 to 65%. The left ventricle has normal function. There is mildly increased left ventricular hypertrophy.  2. Left ventricular diastolic parameters are indeterminate.  3. The left  ventricle demonstrates regional wall motion abnormalities. Apparent inferior basal akinesis.  4. Global right ventricle has normal systolic function.The right ventricular size is normal. No increase in right ventricular wall thickness.  5. Left atrial size was severely dilated.  6. Right atrial size was moderately dilated.  7. The mitral valve is grossly normal. Moderate mitral valve regurgitation.  8. The tricuspid valve is grossly normal.  9. The aortic valve is tricuspid. Aortic valve regurgitation is mild. 10. The pulmonic valve was grossly normal. Pulmonic valve regurgitation is trivial. 11. Moderately elevated pulmonary artery systolic pressure. 12. The inferior vena cava is normal in size with greater than 50% respiratory variability, suggesting right atrial pressure of 3 mmHg. 13. Evidence of atrial level shunting detected by color flow Doppler. 14. The tricuspid regurgitant velocity is 3.23 m/s, and with an assumed right atrial pressure of 3 mmHg, the estimated right ventricular systolic pressure is moderately elevated at 44.7 mmHg.  Patient Profile     86 y.o. female w/ PMH of persistent atrial fibrillation (on Coumadin), HTN, HLD and Type 2 DM who presented to [facility] ED on 07/21/2018 for evaluation of chest pain and dyspnea. Positive for COVID on 07/10/2019.  Assessment & Plan    1. Chest Pain/ Dyspnea on Exertion - reported chest pain and dyspnea the day prior to admission with associated nausea and vomiting. No acute ST abnormalities by EKG but initial HS Troponin 578 with repeat values at 506 and 325. Echocardiogram this admission shows a preserved EF of 60-65% with inferior basal akinesis.  - symptoms thought to be concerning for ACS and a cardiac catheterization has been recommended for definitive evaluation. Her AKI has improved with IVF and INR is coming down with administration of Vitamin K. Will tentatively add to the cath board tomorrow pending repeat INR in AM.  -  continue ASA and statin therapy. BB held given bradycardia.   2. Recent COVID Diagnosis - tested positive for 12/23 and negative on re-test this admission. Husband admitted to [facility]. Verified with the cath lab she would be able to have her procedure this admission.   3. Persistent Atrial Fibrillation - she has been in atrial fibrillation with slow-ventricular response with pauses up to 2.6 seconds by review of telemetry. PTA Lopressor has been discontinued.   - INR 3.3 on admission and at 2.7 today following administration of Vitamin K 2.5mg  yesterday. Discussed with Dr. Domenic Polite and will give additional Vitamin K 2.5mg  today. Repeat INR in AM. Pharmacy following and planning to start Heparin once INR less than 2.0.  4. HTN - BP has been variable from 112/45 - 171/45 within the  past 24 hours. Will titrate Amlodipine to 7.5mg  daily.   5. AKI - creatinine 1.66 on admission, peaking at 1.81 and now improved to 1.42 today following administration of IVF. Agree with stopping IVF at this time given hyponatremia.   For questions or updates, please contact [place] Please consult [email or web address] for contact info under Cardiology/STEMI.   Arna Medici , PA-C 10:08 AM 07/25/2019 Pager: [phone]   Attending note:  Patient seen and examined.  Case discussed with Ms. Ahmed Prima PA-C.  Ms. Buen does not report any recurrent chest pain or breathlessness at rest.  Afebrile, heart rate in the 50s in slow atrial fibrillation by telemetry.  Recent systolic pressures ranging from 130-170.  Lungs are clear.  Cardiac exam reveals irregularly irregular rhythm.  Lab work shows sodium 124, potassium 5.1, creatinine down to 1.42, hemoglobin 10.7, INR down to 2.7.  Plan is for transfer to [facility] pending bed availability in anticipation of diagnostic cardiac catheterization.  Currently admitted with concern for ACS, high-sensitivity troponin I up to 578 with sudden onset breathlessness  and chest tightness preceeding.  She does have a recent COVID-19 diagnosis on December 23, however follow-up testing negative.  She remains supratherapeutic with Coumadin on hold, low-dose vitamin K has also been given, trending down.  She remains in slow, persistent atrial fibrillation despite discontinuation of beta-blocker (question ischemia mediated or progressive conduction system disease, possibly even related to recent COVID-19).  Creatinine improving with hydration although with hyponatremia, IVF being discontinued.  She has CKD stage III at baseline, creatinine now 1.42.  Satira Sark, M.D., F.A.C.C.

## 2019-07-25 NOTE — Progress Notes (Signed)
ANTICOAGULATION CONSULT NOTE -   Pharmacy Consult for Heparin Indication: ACS/STEMI  No Known Allergies  Patient Measurements: Height: 5\' 2"  (157.5 cm) Weight: 162 lb 0.6 oz (73.5 kg) IBW/kg (Calculated) : 50.1 HEPARIN DW (KG): 65.9  Vital Signs: Temp: 98.3 F (36.8 C) (01/07 0519) Temp Source: Oral (01/07 0519) BP: 171/45 (01/07 0519) Pulse Rate: 38 (01/07 0519)  Labs: Recent Labs    08/02/2019 1107 08/13/2019 2144 07/23/19 0514 07/23/19 0900 07/24/19 0333  HGB  --   --  12.1  --  10.5*  HCT  --   --  38.1  --  33.2*  PLT  --   --  394  --  402*  LABPROT 33.2*  --   --  35.0* 37.9*  INR 3.3*  --   --  3.5* 3.9*  CREATININE  --   --  1.81*  --  1.81*  TROPONINIHS 506* 325*  --   --   --     Estimated Creatinine Clearance: 21.7 mL/min (A) (by C-G formula based on SCr of 1.81 mg/dL (H)).   Medical History: Past Medical History:  Diagnosis Date  . Atrial fibrillation, chronic ([place])   . Cerebrovascular disease    Left cerebral CVA 2007; residual right sided weakness; 7/09 mild plaque; no focal stenosis  . Chronic anticoagulation   . Chronic kidney disease, stage 3, mod decreased GFR    Creatinine of 1.5 in 10/2008; h/o hyper- and hypokalemia  . Colon adenocarcinoma ([place]) 2009   s/p right hemicolectomy  . Diabetes mellitus, type 2 ([place])   . Dupuytren's contracture   . Hyperlipidemia   . Hypertension     Medications:  Medications Prior to Admission  Medication Sig Dispense Refill Last Dose  . amLODipine (NORVASC) 5 MG tablet Take 1 tablet (5 mg total) by mouth daily. 30 tablet 6 07/21/2019 at Unknown time  . glipiZIDE (GLUCOTROL XL) 2.5 MG 24 hr tablet Take 2.5 mg by mouth daily.     07/21/2019 at Unknown time  . hydrochlorothiazide (MICROZIDE) 12.5 MG capsule Take 12.5 mg by mouth daily.   07/21/2019 at Unknown time  . metFORMIN (GLUCOPHAGE) 500 MG tablet Take 500 mg by mouth 2 (two) times daily with a meal.     07/21/2019 at Unknown time  . metoprolol (LOPRESSOR) 50 MG  tablet Take 50 mg by mouth 2 (two) times daily.     07/21/2019 at 2130  . omeprazole (PRILOSEC) 20 MG capsule Take 20 mg by mouth daily.     07/21/2019 at Unknown time  . potassium chloride SA (K-DUR,KLOR-CON) 20 MEQ tablet TAKE 1 TABLET BY MOUTH EVERY DAY (Patient taking differently: Take 20 mEq by mouth daily. ) 30 tablet 0 07/21/2019 at Unknown time  . simvastatin (ZOCOR) 20 MG tablet Take 20 mg by mouth at bedtime.    07/21/2019 at Unknown time  . warfarin (COUMADIN) 5 MG tablet TAKE 1 TABLET BY MOUTH DAILY EXCEPT TAKE 1/2 TABLET ON MONDAYS, WEDNESDAYS AND FRIDAYS (Patient taking differently: Take 5 mg by mouth See admin instructions. TAKE 2.5 MG ON MONDAYS, WEDNESDAYS AND FRIDAYS. TAKE 5 MG ON OTHER DAYS) 90 tablet 1 07/21/2019 at Unknown time    Assessment: 86 yo female presented to ED with chest pain/dypsnea.No acute ST abnormalities by EKG but initial HS Troponin 578 with repeat values at 506 and 325.  She is chronically anticoagulated on coumadin for afib. INR this AM is in therapeutic range at 2.7 after vitamin k given 1/6. pharmacy asked  to transition to heparin once INR <2.  Vitamin K 2.5 mg PO given 1/6  Goal of Therapy:  Heparin level 0.3-0.7 units/ml Monitor platelets by anticoagulation protocol: Yes   Plan:  Daily PT-INR Start heparin infusion once INR <2 Check anti-Xa level in 8 hours and daily while on heparin Continue to monitor H&H and platelets  Margot Ables, PharmD Clinical Pharmacist 07/25/2019 8:03 AM

## 2019-07-25 NOTE — Progress Notes (Signed)
PROGRESS NOTE  Katrina Arnold [id number] DOB: 11/02/1932 DOA: 07/23/2019 PCP: Rosita Fire, MD  Brief History:  86 year old female with a history of chronic atrial fibrillation on warfarin, hypertension, hyperlipidemia, diabetes mellitus type 2 presenting with chest discomfort and shortness of breath that woke her up from bed on the morning of 07/21/2019.  She had some associated nausea and emesis eventually.  She has some associated malaise with nausea and myalgias.  She was diagnosed with COVID-19 on 07/10/2019 and her husband is currently admitted to [facility] 2 days prior to this admission.  Regarding her COVID-19 diagnosis, the patient had been fairly asymptomatic prior to the onset of her current symptoms for this admission. The patient was found to have atrial fibrillation with slow ventricular response with heart rate as low as 30s.  Cardiology was consulted to assist with management.  Troponins were elevated with a peak of 578.  There was concern for ACS. Repeat SARS-CoV2 RT-PCR is negative.  Assessment/Plan: elevated troponin/Angina Pectoris -Concern for ACS -Troponins 578>>> 506>>> 325 -Appreciate cardiology consult and recommendations. -Optimizing renal function and coagulopathy prior to transfer for heart catheterization. -Bed requested for telemetry bed at [facility] today -Low-dose oral vitamin K given given the still elevated INR -Continue IV fluids to further stabilize her renal function. -case discussed with cardiology, Dr. Domenic Polite -Continue ASA 81 mg  Acute on chronic renal failure--CKD stage III -Baseline creatinine 1.1-1.4 -Serum creatinine peaked and is still 1.8 -Continue holding HCTZ -Creatinine now back to baseline at 1.42 -Continue minimizing/avoiding nephrotoxic agents.  Follow renal function trend.  Persistent atrial fibrillation with slow ventricular response -Continue holding metoprolol -Appreciate cardiology follow-up and  recommendations. -Personally reviewed EKG--atrial fibrillation, nonspecific T wave changes -Patient will be transferred to [facility] for further evaluation and management. -Denying palpitations or chest pain currently.  COVID-19 infection -Initially tested + 07/10/2019 -Repeat SARS-CoV2 RT-PCR is negative -CRP 1.2 -Ferritin 161 -D-dimer 0.37 -No respiratory complaints.  Coagulopathy -INR has trended up 3.3>>> 3.5>>>3.9>>>2.7 (goal is for INR less than 2 for size cath). -While planning possible cath on 07/26/2019; an oral dose of vitamin K will be provided today. -Follow INR trend  Hyperlipidemia -Continue statin  Diabetes mellitus type 2 -Holding metformin and glipizide -Hemoglobin A1c 7.6 -Continue NovoLog sliding scale  Essential hypertension -Vital signs stable -Calcium channel blockers and beta-blockers discontinued in the setting of bradycardia.  Hyponatremia -due to volume depletion -Sodium level stable -IV fluids discontinued currently to prevent fluid overload.  Disposition Plan:   Transfer to [facility] telemetry bed for anticipated heart cath and further evaluation/management by cardiology service.  Still waiting on bed for patient to be transferred.   Family Communication:   No Family at bedside  Consultants:  cardiology  Code Status:  FULL   DVT Prophylaxis:  coumadin   Procedures: As Listed in Progress Note Above  Antibiotics: None   Subjective: No chest pain, no palpitations, no nausea, no vomiting, no shortness of breath.  Patient in no acute distress.   Objective: Vitals:   07/24/19 2109 07/25/19 0500 07/25/19 0519 07/25/19 1435  BP: (!) 112/96  (!) 171/45 (!) 153/57  Pulse: (!) 39  (!) 38 (!) 37  Resp: 16  20 18   Temp: 98.9 F (37.2 C)  98.3 F (36.8 C)   TempSrc: Oral  Oral   SpO2: 99%  100% 100%  Weight:  73.5 kg    Height:  No intake or output data in the 24 hours ending 07/25/19 1804 Weight change: -0.7 kg    Exam: General exam: Alert, awake, oriented x 3.  No chest pain, no nausea, no vomiting, no dizziness.  Patient reports no shortness of breath or palpitations. Respiratory system: Clear to auscultation. Respiratory effort normal. Cardiovascular system: Sinus bradycardia; no rubs, no gallops, no JVD.   Gastrointestinal system: Abdomen is nondistended, soft and nontender. No organomegaly or masses felt. Normal bowel sounds heard. Central nervous system: Alert and oriented. No focal neurological deficits. Extremities: No C/C/E, +pedal pulses Skin: No rashes, lesions or ulcers Psychiatry: Judgement and insight appear normal. Mood & affect appropriate.    Data Reviewed: I have personally reviewed following labs and imaging studies.  Basic Metabolic Panel: Recent Labs  Lab 07/26/2019 0559 07/23/19 0514 07/24/19 0333 07/25/19 0804  NA 129* 131* 127* 124*  K 4.8 5.0 4.8 5.1  CL 96* 98 96* 95*  CO2 20* 18* 17* 16*  GLUCOSE 218* 165* 180* 194*  BUN 16 24* 26* 20  CREATININE 1.66* 1.81* 1.81* 1.42*  CALCIUM 9.0 9.0 8.5* 8.4*  MG  --  1.9 1.8 1.8  PHOS  --  3.4 3.1  --    Liver Function Tests: Recent Labs  Lab 07/24/2019 0559 07/23/19 0514 07/24/19 0333 07/25/19 0804  AST 28 21 21 31   ALT 13 11 11 14   ALKPHOS 83 77 70 88  BILITOT 0.6 0.8 0.9 1.2  PROT 7.8 7.5 6.9 6.7  ALBUMIN 3.4* 3.3* 3.1* 3.1*   Coagulation Profile: Recent Labs  Lab 08/05/2019 1107 07/23/19 0900 07/24/19 0333 07/25/19 0804  INR 3.3* 3.5* 3.9* 2.7*   CBC: Recent Labs  Lab 08/16/2019 0559 07/23/19 0514 07/24/19 0333 07/25/19 0804  WBC 8.1 7.9 8.6 9.6  NEUTROABS 5.7 4.6 5.3 7.4  HGB 12.2 12.1 10.5* 10.7*  HCT 37.4 38.1 33.2* 34.5*  MCV 86.4 87.0 86.5 89.8  PLT 364 394 402* 372   CBG: Recent Labs  Lab 07/24/19 1602 07/24/19 2109 07/25/19 0758 07/25/19 1139 07/25/19 1705  GLUCAP 169* 153* 170* 198* 203*   HbA1C: Recent Labs    07/23/19 0514  HGBA1C 7.6*   Urine analysis:    Component  Value Date/Time   COLORURINE YELLOW 04/23/2017 0049   APPEARANCEUR HAZY (A) 04/23/2017 0049   LABSPEC 1.021 04/23/2017 0049   PHURINE 5.0 04/23/2017 0049   GLUCOSEU NEGATIVE 04/23/2017 0049   GLUCOSEU NEG mg/dL 07/06/2009 1903   HGBUR MODERATE (A) 04/23/2017 0049   BILIRUBINUR NEGATIVE 04/23/2017 0049   KETONESUR NEGATIVE 04/23/2017 0049   PROTEINUR 100 (A) 04/23/2017 0049   UROBILINOGEN 1 07/06/2009 1903   NITRITE NEGATIVE 04/23/2017 0049   LEUKOCYTESUR NEGATIVE 04/23/2017 0049    Recent Results (from the past 240 hour(s))  Respiratory Panel by RT PCR (Flu A&B, Covid) - Nasopharyngeal Swab     Status: None   Collection Time: 07/26/2019  8:42 AM   Specimen: Nasopharyngeal Swab  Result Value Ref Range Status   SARS Coronavirus 2 by RT PCR NEGATIVE NEGATIVE Final    Comment: (NOTE) SARS-CoV-2 target nucleic acids are NOT DETECTED. The SARS-CoV-2 RNA is generally detectable in upper respiratoy specimens during the acute phase of infection. The lowest concentration of SARS-CoV-2 viral copies this assay can detect is 131 copies/mL. A negative result does not preclude SARS-Cov-2 infection and should not be used as the sole basis for treatment or other patient management decisions. A negative result may occur with  improper specimen collection/handling,  submission of specimen other than nasopharyngeal swab, presence of viral mutation(s) within the areas targeted by this assay, and inadequate number of viral copies (<131 copies/mL). A negative result must be combined with clinical observations, patient history, and epidemiological information. The expected result is Negative. Fact Sheet for Patients:  PinkCheek.be Fact Sheet for Healthcare Providers:  GravelBags.it This test is not yet ap proved or cleared by the [place] FDA and  has been authorized for detection and/or diagnosis of SARS-CoV-2 by FDA under an Emergency Use  Authorization (EUA). This EUA will remain  in effect (meaning this test can be used) for the duration of the COVID-19 declaration under Section 564(b)(1) of the Act, 21 U.S.C. section 360bbb-3(b)(1), unless the authorization is terminated or revoked sooner.    Influenza A by PCR NEGATIVE NEGATIVE Final   Influenza B by PCR NEGATIVE NEGATIVE Final    Comment: (NOTE) The Xpert Xpress SARS-CoV-2/FLU/RSV assay is intended as an aid in  the diagnosis of influenza from Nasopharyngeal swab specimens and  should not be used as a sole basis for treatment. Nasal washings and  aspirates are unacceptable for Xpert Xpress SARS-CoV-2/FLU/RSV  testing. Fact Sheet for Patients: PinkCheek.be Fact Sheet for Healthcare Providers: GravelBags.it This test is not yet approved or cleared by the [place] FDA and  has been authorized for detection and/or diagnosis of SARS-CoV-2 by  FDA under an Emergency Use Authorization (EUA). This EUA will remain  in effect (meaning this test can be used) for the duration of the  Covid-19 declaration under Section 564(b)(1) of the Act, 21  U.S.C. section 360bbb-3(b)(1), unless the authorization is  terminated or revoked. Performed at [facility], [address], [place], [place] [address]      Scheduled Meds: . albuterol  2 puff Inhalation Q6H  . [START ON 07/26/2019] amLODipine  7.5 mg Oral Daily  . vitamin C  500 mg Oral Daily  . aspirin EC  81 mg Oral Daily  . insulin aspart  0-5 Units Subcutaneous QHS  . insulin aspart  0-9 Units Subcutaneous TID WC  . multivitamin with minerals  1 tablet Oral Daily  . pantoprazole  40 mg Oral Daily  . simvastatin  20 mg Oral QHS  . sodium chloride flush  3 mL Intravenous Q12H  . sodium chloride flush  3 mL Intravenous Q12H  . zinc sulfate  220 mg Oral Daily   Continuous Infusions: . sodium chloride      Procedures/Studies: DG Chest Port 1 View  Result Date:  07/24/2019 CLINICAL DATA:  Vomiting and shortness of breath. EXAM: PORTABLE CHEST 1 VIEW COMPARISON:  01/09/2008 FINDINGS: Normal heart size and mediastinal contours. There is no edema, consolidation, effusion, or pneumothorax. No osseous findings. IMPRESSION: No evidence of active disease. Electronically Signed   By: Monte Fantasia M.D.   On: 07/26/2019 06:03   ECHOCARDIOGRAM COMPLETE  Result Date: 07/19/2019   ECHOCARDIOGRAM REPORT   Patient Name:   ADDALINE GOCH Date of Exam: 08/11/2019 Medical Rec #:  [id number]    Height:       62.0 in Accession #:    [id number]   Weight:       162.0 lb Date of Birth:  Sep 02, 1932    BSA:          1.75 m Patient Age:    17 years     BP:           144/67 mmHg Patient Gender: F  HR:           40 bpm. Exam Location:  [facility] Procedure: 2D Echo, Cardiac Doppler and Color Doppler Indications:    Chest Pain 786.50 / R07.9  History:        Patient has prior history of Echocardiogram examinations, most                 recent 01/14/2008. Arrythmias:Atrial Fibrillation; Risk                 Factors:Hypertension, Diabetes and Dyslipidemia. Chronic                 anticoagulation,Hx of Adenocarcinoma of colon.  Sonographer:    BW Referring Phys: [place]  1. Left ventricular ejection fraction, by visual estimation, is 60 to 65%. The left ventricle has normal function. There is mildly increased left ventricular hypertrophy.  2. Left ventricular diastolic parameters are indeterminate.  3. The left ventricle demonstrates regional wall motion abnormalities. Apparent inferior basal akinesis.  4. Global right ventricle has normal systolic function.The right ventricular size is normal. No increase in right ventricular wall thickness.  5. Left atrial size was severely dilated.  6. Right atrial size was moderately dilated.  7. The mitral valve is grossly normal. Moderate mitral valve regurgitation.  8. The tricuspid valve is grossly normal.  9. The aortic valve is  tricuspid. Aortic valve regurgitation is mild. 10. The pulmonic valve was grossly normal. Pulmonic valve regurgitation is trivial. 11. Moderately elevated pulmonary artery systolic pressure. 12. The inferior vena cava is normal in size with greater than 50% respiratory variability, suggesting right atrial pressure of 3 mmHg. 13. Evidence of atrial level shunting detected by color flow Doppler. 14. The tricuspid regurgitant velocity is 3.23 m/s, and with an assumed right atrial pressure of 3 mmHg, the estimated right ventricular systolic pressure is moderately elevated at 44.7 mmHg. FINDINGS  Left Ventricle: Left ventricular ejection fraction, by visual estimation, is 60 to 65%. The left ventricle has normal function. The left ventricle demonstrates regional wall motion abnormalities. The left ventricular internal cavity size was the left ventricle is normal in size. There is mildly increased left ventricular hypertrophy. Left ventricular diastolic parameters are indeterminate. Right Ventricle: The right ventricular size is normal. No increase in right ventricular wall thickness. Global RV systolic function is has normal systolic function. The tricuspid regurgitant velocity is 3.23 m/s, and with an assumed right atrial pressure  of 3 mmHg, the estimated right ventricular systolic pressure is moderately elevated at 44.7 mmHg. Left Atrium: Left atrial size was severely dilated. Right Atrium: Right atrial size was moderately dilated Pericardium: There is no evidence of pericardial effusion. Mitral Valve: The mitral valve is grossly normal. Moderate mitral valve regurgitation. Tricuspid Valve: The tricuspid valve is grossly normal. Tricuspid valve regurgitation moderate. Aortic Valve: The aortic valve is tricuspid. Aortic valve regurgitation is mild. Mild aortic valve annular calcification. Pulmonic Valve: The pulmonic valve was grossly normal. Pulmonic valve regurgitation is trivial. Pulmonic regurgitation is trivial.  Aorta: The aortic root is normal in size and structure. Venous: The inferior vena cava is normal in size with greater than 50% respiratory variability, suggesting right atrial pressure of 3 mmHg. IAS/Shunts: Evidence of atrial level shunting detected by color flow Doppler.  LEFT VENTRICLE PLAX 2D LVIDd:         3.36 cm LVIDs:         1.95 cm LV PW:  1.02 cm LV IVS:        1.13 cm LVOT diam:     1.60 cm LV SV:         34 ml LV SV Index:   18.79 LVOT Area:     2.01 cm  RIGHT VENTRICLE TAPSE (M-mode): 1.9 cm LEFT ATRIUM             Index       RIGHT ATRIUM           Index LA diam:        3.30 cm 1.89 cm/m  RA Area:     24.00 cm LA Vol (A2C):   78.6 ml 44.97 ml/m RA Volume:   79.80 ml  45.65 ml/m LA Vol (A4C):   95.1 ml 54.41 ml/m LA Biplane Vol: 88.4 ml 50.57 ml/m  AORTIC VALVE LVOT Vmax:   86.15 cm/s LVOT Vmean:  60.850 cm/s LVOT VTI:    0.211 m  AORTA Ao Root diam: 3.30 cm MITRAL VALVE                         TRICUSPID VALVE MV Area (PHT): 3.31 cm              TR Peak grad:   41.7 mmHg MV PHT:        66.41 msec            TR Vmax:        323.00 cm/s MV Decel Time: 229 msec MV E velocity: 117.00 cm/s 103 cm/s  SHUNTS MV A velocity: 21.40 cm/s  70.3 cm/s Systemic VTI:  0.21 m MV E/A ratio:  5.47        1.5       Systemic Diam: 1.60 cm  Rozann Lesches MD Electronically signed by Rozann Lesches MD Signature Date/Time: 08/04/2019/2:50:08 PM    Final     Barton Dubois, MD  [facility] Pager [phone]  07/25/2019, 6:04 PM   LOS: 3 days

## 2019-07-26 DIAGNOSIS — I4811 Longstanding persistent atrial fibrillation: Secondary | ICD-10-CM

## 2019-07-26 DIAGNOSIS — N1832 Chronic kidney disease, stage 3b: Secondary | ICD-10-CM | POA: Diagnosis not present

## 2019-07-26 DIAGNOSIS — I209 Angina pectoris, unspecified: Secondary | ICD-10-CM

## 2019-07-26 DIAGNOSIS — N179 Acute kidney failure, unspecified: Secondary | ICD-10-CM | POA: Diagnosis not present

## 2019-07-26 DIAGNOSIS — I4891 Unspecified atrial fibrillation: Secondary | ICD-10-CM | POA: Diagnosis not present

## 2019-07-26 DIAGNOSIS — Z7901 Long term (current) use of anticoagulants: Secondary | ICD-10-CM | POA: Diagnosis not present

## 2019-07-26 DIAGNOSIS — I214 Non-ST elevation (NSTEMI) myocardial infarction: Secondary | ICD-10-CM | POA: Diagnosis not present

## 2019-07-26 DIAGNOSIS — E1121 Type 2 diabetes mellitus with diabetic nephropathy: Secondary | ICD-10-CM | POA: Diagnosis not present

## 2019-07-26 DIAGNOSIS — I482 Chronic atrial fibrillation, unspecified: Secondary | ICD-10-CM | POA: Diagnosis not present

## 2019-07-26 DIAGNOSIS — I10 Essential (primary) hypertension: Secondary | ICD-10-CM | POA: Diagnosis not present

## 2019-07-26 LAB — BASIC METABOLIC PANEL
Anion gap: 12 (ref 5–15)
BUN: 19 mg/dL (ref 8–23)
CO2: 16 mmol/L — ABNORMAL LOW (ref 22–32)
Calcium: 8.3 mg/dL — ABNORMAL LOW (ref 8.9–10.3)
Chloride: 95 mmol/L — ABNORMAL LOW (ref 98–111)
Creatinine, Ser: 1.47 mg/dL — ABNORMAL HIGH (ref 0.44–1.00)
GFR calc Af Amer: 38 mL/min — ABNORMAL LOW (ref >60)
GFR calc non Af Amer: 32 mL/min — ABNORMAL LOW (ref >60)
Glucose, Bld: 213 mg/dL — ABNORMAL HIGH (ref 70–99)
Potassium: 4.7 mmol/L (ref 3.5–5.1)
Sodium: 123 mmol/L — ABNORMAL LOW (ref 135–145)

## 2019-07-26 LAB — TYPE AND SCREEN
ABO/RH(D): B POS
Antibody Screen: NEGATIVE

## 2019-07-26 LAB — GLUCOSE, CAPILLARY
Glucose-Capillary: 208 mg/dL — ABNORMAL HIGH (ref 70–99)
Glucose-Capillary: 195 mg/dL — ABNORMAL HIGH (ref 70–99)
Glucose-Capillary: 218 mg/dL — ABNORMAL HIGH (ref 70–99)
Glucose-Capillary: 157 mg/dL — ABNORMAL HIGH (ref 70–99)

## 2019-07-26 LAB — PROTIME-INR
INR: 2.7 — ABNORMAL HIGH (ref 0.8–1.2)
Prothrombin Time: 28.3 seconds — ABNORMAL HIGH (ref 11.4–15.2)

## 2019-07-26 MED ORDER — VITAMIN K1 10 MG/ML IJ SOLN
10.0000 mg | Freq: Once | INTRAMUSCULAR | Status: AC
  Administered 2019-07-26: 10 mg via SUBCUTANEOUS
  Filled 2019-07-26: qty 1

## 2019-07-26 MED ORDER — ALBUTEROL SULFATE HFA 108 (90 BASE) MCG/ACT IN AERS
2.0000 | INHALATION_SPRAY | RESPIRATORY_TRACT | Status: DC | PRN
  Administered 2019-07-28 – 2019-07-29 (×2): 2 via RESPIRATORY_TRACT
  Filled 2019-07-26: qty 6.7

## 2019-07-26 MED ORDER — SODIUM CHLORIDE 0.9% IV SOLUTION: Freq: Once | INTRAVENOUS | Status: AC

## 2019-07-26 NOTE — Progress Notes (Signed)
ANTICOAGULATION CONSULT NOTE -   Pharmacy Consult for Heparin Indication: ACS/STEMI  No Known Allergies  Patient Measurements: Height: 5\' 2"  (157.5 cm) Weight: 162 lb 0.6 oz (73.5 kg) IBW/kg (Calculated) : 50.1 HEPARIN DW (KG): 65.9  Vital Signs: Temp: 98.5 F (36.9 C) (01/08 0512) Temp Source: Oral (01/08 0512) BP: 154/62 (01/08 0839) Pulse Rate: 52 (01/08 0512)  Labs: Recent Labs    07/24/19 0333 07/25/19 0804 07/26/19 0810  HGB 10.5* 10.7*  --   HCT 33.2* 34.5*  --   PLT 402* 372  --   LABPROT 37.9* 28.8* 28.3*  INR 3.9* 2.7* 2.7*  CREATININE 1.81* 1.42* 1.47*    Estimated Creatinine Clearance: 26.8 mL/min (A) (by C-G formula based on SCr of 1.47 mg/dL (H)).   Medical History: Past Medical History:  Diagnosis Date  . Atrial fibrillation, chronic ([place])   . Cerebrovascular disease    Left cerebral CVA 2007; residual right sided weakness; 7/09 mild plaque; no focal stenosis  . Chronic anticoagulation   . Chronic kidney disease, stage 3, mod decreased GFR    Creatinine of 1.5 in 10/2008; h/o hyper- and hypokalemia  . Colon adenocarcinoma ([place]) 2009   s/p right hemicolectomy  . Diabetes mellitus, type 2 ([place])   . Dupuytren's contracture   . Hyperlipidemia   . Hypertension     Medications:  Medications Prior to Admission  Medication Sig Dispense Refill Last Dose  . amLODipine (NORVASC) 5 MG tablet Take 1 tablet (5 mg total) by mouth daily. 30 tablet 6 07/21/2019 at Unknown time  . glipiZIDE (GLUCOTROL XL) 2.5 MG 24 hr tablet Take 2.5 mg by mouth daily.     07/21/2019 at Unknown time  . hydrochlorothiazide (MICROZIDE) 12.5 MG capsule Take 12.5 mg by mouth daily.   07/21/2019 at Unknown time  . metFORMIN (GLUCOPHAGE) 500 MG tablet Take 500 mg by mouth 2 (two) times daily with a meal.     07/21/2019 at Unknown time  . metoprolol (LOPRESSOR) 50 MG tablet Take 50 mg by mouth 2 (two) times daily.     07/21/2019 at 2130  . omeprazole (PRILOSEC) 20 MG capsule Take 20 mg by  mouth daily.     07/21/2019 at Unknown time  . potassium chloride SA (K-DUR,KLOR-CON) 20 MEQ tablet TAKE 1 TABLET BY MOUTH EVERY DAY (Patient taking differently: Take 20 mEq by mouth daily. ) 30 tablet 0 07/21/2019 at Unknown time  . simvastatin (ZOCOR) 20 MG tablet Take 20 mg by mouth at bedtime.    07/21/2019 at Unknown time  . warfarin (COUMADIN) 5 MG tablet TAKE 1 TABLET BY MOUTH DAILY EXCEPT TAKE 1/2 TABLET ON MONDAYS, WEDNESDAYS AND FRIDAYS (Patient taking differently: Take 5 mg by mouth See admin instructions. TAKE 2.5 MG ON MONDAYS, WEDNESDAYS AND FRIDAYS. TAKE 5 MG ON OTHER DAYS) 90 tablet 1 07/21/2019 at Unknown time    Assessment: 86 yo female presented to ED with chest pain/dypsnea.No acute ST abnormalities by EKG but initial HS Troponin 578 with repeat values at 506 and 325.  She is chronically anticoagulated on coumadin for afib. INR this AM is in therapeutic range at 2.7 after vitamin k given 1/6. pharmacy asked to transition to heparin once INR <2.  Vitamin K 2.5 mg PO given 1/6 1/8: INR still 2.7- more Vit K to be ordered  Goal of Therapy:  Heparin level 0.3-0.7 units/ml Monitor platelets by anticoagulation protocol: Yes   Plan:  Daily PT-INR Start heparin infusion once INR <2 Check anti-Xa level  in 8 hours and daily while on heparin Continue to monitor H&H and platelets  Margot Ables, PharmD Clinical Pharmacist 07/26/2019 8:46 AM

## 2019-07-26 NOTE — Progress Notes (Signed)
Progress Note  Patient Name: Katrina Arnold Date of Encounter: 07/26/2019  Primary Cardiologist: Carlyle Dolly, MD   Subjective   Anxious about going to [facility]. Visually with mild tachypnea   Inpatient Medications    Scheduled Meds: . albuterol  2 puff Inhalation Q6H  . amLODipine  7.5 mg Oral Daily  . vitamin C  500 mg Oral Daily  . aspirin EC  81 mg Oral Daily  . insulin aspart  0-5 Units Subcutaneous QHS  . insulin aspart  0-9 Units Subcutaneous TID WC  . multivitamin with minerals  1 tablet Oral Daily  . pantoprazole  40 mg Oral Daily  . simvastatin  20 mg Oral QHS  . sodium chloride flush  3 mL Intravenous Q12H  . sodium chloride flush  3 mL Intravenous Q12H  . zinc sulfate  220 mg Oral Daily   Continuous Infusions: . sodium chloride     PRN Meds: sodium chloride, acetaminophen **OR** acetaminophen, chlorpheniramine-HYDROcodone, guaiFENesin-dextromethorphan, ondansetron **OR** ondansetron (ZOFRAN) IV, polyethylene glycol, sodium chloride flush, traZODone   Vital Signs    Vitals:   07/25/19 1435 07/25/19 2136 07/26/19 0512 07/26/19 0839  BP: (!) 153/57 (!) 143/80 (!) 171/59 (!) 154/62  Pulse: (!) 37 (!) 43 (!) 52   Resp: 18 18 20    Temp:  98.6 F (37 C) 98.5 F (36.9 C)   TempSrc:  Oral Oral   SpO2: 100% 100% 100%   Weight:      Height:        Intake/Output Summary (Last 24 hours) at 07/26/2019 0855 Last data filed at 07/25/2019 1700 Gross per 24 hour  Intake 180 ml  Output -  Net 180 ml    Last 3 Weights 07/25/2019 07/24/2019 08/03/2019  Weight (lbs) 162 lb 0.6 oz 163 lb 9.3 oz 162 lb  Weight (kg) 73.5 kg 74.2 kg 73.483 kg      Telemetry    Atrial fibrillation, HR in 30's to 40's overnight with pauses up to 2.62 seconds. Currently in the 60's.  - Personally Reviewed  ECG    Atrial fibrillation with slow ventricular response, HR 54. No acute ST changes. - Personally Reviewed  Physical Exam   Affect appropriate Overweight black female  HEENT: normal  Neck supple with no adenopathy JVP normal no bruits no thyromegaly Lungs basilar crackles no wheezing and good diaphragmatic motion Heart:  S1/S2 no murmur, no rub, gallop or click PMI normal Abdomen: benighn, BS positve, no tenderness, no AAA no bruit.  No HSM or HJR Distal pulses intact with no bruits No edema Neuro non-focal Skin warm and dry No muscular weakness   Labs    Chemistry Recent Labs  Lab 07/23/19 0514 07/24/19 0333 07/25/19 0804 07/26/19 0810  NA 131* 127* 124* 123*  K 5.0 4.8 5.1 4.7  CL 98 96* 95* 95*  CO2 18* 17* 16* 16*  GLUCOSE 165* 180* 194* 213*  BUN 24* 26* 20 19  CREATININE 1.81* 1.81* 1.42* 1.47*  CALCIUM 9.0 8.5* 8.4* 8.3*  PROT 7.5 6.9 6.7  --   ALBUMIN 3.3* 3.1* 3.1*  --   AST 21 21 31   --   ALT 11 11 14   --   ALKPHOS 77 70 88  --   BILITOT 0.8 0.9 1.2  --   GFRNONAA 25* 25* 34* 32*  GFRAA 29* 29* 39* 38*  ANIONGAP 15 14 13 12      Hematology Recent Labs  Lab 07/23/19 [id number] 07/24/19 [id number] 07/25/19 [id number]  WBC 7.9 8.6 9.6  RBC 4.38 3.84* 3.84*  HGB 12.1 10.5* 10.7*  HCT 38.1 33.2* 34.5*  MCV 87.0 86.5 89.8  MCH 27.6 27.3 27.9  MCHC 31.8 31.6 31.0  RDW 13.6 13.9 14.2  PLT 394 402* 372    Cardiac EnzymesNo results for input(s): TROPONINI in the last 168 hours. No results for input(s): TROPIPOC in the last 168 hours.   BNP Recent Labs  Lab 07/25/2019 0559  BNP 259.0*     DDimer  Recent Labs  Lab 07/23/19 0514 07/24/19 0333  DDIMER 0.37 0.41     Radiology    No results found.  Cardiac Studies   Echocardiogram: 08/18/2019 IMPRESSIONS    1. Left ventricular ejection fraction, by visual estimation, is 60 to 65%. The left ventricle has normal function. There is mildly increased left ventricular hypertrophy.  2. Left ventricular diastolic parameters are indeterminate.  3. The left ventricle demonstrates regional wall motion abnormalities. Apparent inferior basal akinesis.  4. Global right ventricle has normal  systolic function.The right ventricular size is normal. No increase in right ventricular wall thickness.  5. Left atrial size was severely dilated.  6. Right atrial size was moderately dilated.  7. The mitral valve is grossly normal. Moderate mitral valve regurgitation.  8. The tricuspid valve is grossly normal.  9. The aortic valve is tricuspid. Aortic valve regurgitation is mild. 10. The pulmonic valve was grossly normal. Pulmonic valve regurgitation is trivial. 11. Moderately elevated pulmonary artery systolic pressure. 12. The inferior vena cava is normal in size with greater than 50% respiratory variability, suggesting right atrial pressure of 3 mmHg. 13. Evidence of atrial level shunting detected by color flow Doppler. 14. The tricuspid regurgitant velocity is 3.23 m/s, and with an assumed right atrial pressure of 3 mmHg, the estimated right ventricular systolic pressure is moderately elevated at 44.7 mmHg.  Patient Profile     86 y.o. female w/ PMH of persistent atrial fibrillation (on Coumadin), HTN, HLD and Type 2 DM who presented to [facility] ED on 07/21/2018 for evaluation of chest pain and dyspnea. Positive for COVID on 07/10/2019.  Assessment & Plan    1. Chest Pain/ Dyspnea on Exertion-  No acute ECG changes EF 60-65% by ecoh per Dr Domenic Polite symptoms concerning for ACS plans to go to [facility] for cath last two days. INR 2.7 today will get  Vit K already and Dr  Martin[place] suggested 2 units FFP Hopefully can go directly to cath lab around noon and recheck INR Should be Ok to do from radial artery latter today Continue ASA and statin beta blocker held due to bradycardia  2. Recent COVID Diagnosis  tested positive for 12/23 and negative on re-test this admission. Husband admitted to [facility]. Verified with the cath lab she would be able to have her procedure this admission.   3. Persistent Atrial Fibrillation rate control fine off lopressor 2.6 sec pause previously coumadin held for cath INR  2.7  4. HTN  Well controlled.  Continue current medications and low sodium Dash type diet.    5. AKI- Cr 1.47 today stable    For questions or updates, please contact [place] Please consult [email or web address] for contact info under Cardiology/STEMI.   Rozann Lesches , PA-C 8:55 AM 07/26/2019 Pager: [phone]

## 2019-07-26 NOTE — Progress Notes (Signed)
PROGRESS NOTE  KALIKA PATRAW [id number] DOB: 05/03/1933 DOA: 08/18/2019 PCP: Rosita Fire, MD  Brief History:  86 year old female with a history of chronic atrial fibrillation on warfarin, hypertension, hyperlipidemia, diabetes mellitus type 2 presenting with chest discomfort and shortness of breath that woke her up from bed on the morning of 07/26/2019.  She had some associated nausea and emesis eventually.  She has some associated malaise with nausea and myalgias.  She was diagnosed with COVID-19 on 07/10/2019 and her husband is currently admitted to [facility] 2 days prior to this admission.  Regarding her COVID-19 diagnosis, the patient had been fairly asymptomatic prior to the onset of her current symptoms for this admission. The patient was found to have atrial fibrillation with slow ventricular response with heart rate as low as 30s.  Cardiology was consulted to assist with management.  Troponins were elevated with a peak of 578.  There was concern for ACS. Repeat SARS-CoV2 RT-PCR is negative.  Assessment/Plan: elevated troponin/Angina Pectoris -Concern for ACS -Troponins 578>>> 506>>> 325 -Appreciate cardiology consult and recommendations. -Optimizing renal function and coagulopathy prior to transfer for heart catheterization. -Bed requested for telemetry bed at [facility] today -Low-dose oral vitamin K given given the still elevated INR -Continue IV fluids to further stabilize her renal function. -case discussed with cardiology, Dr. Johnsie Cancel and PA Bernerd Pho. -Continue ASA 81 mg  Acute on chronic renal failure--CKD stage III -Baseline creatinine 1.1-1.4 -Serum creatinine peaked and is still 1.8 -Continue holding HCTZ -Creatinine now back to baseline at 1.42 -Continue minimizing/avoiding nephrotoxic agents.  Follow renal function trend.  Persistent atrial fibrillation with slow ventricular response -Continue holding metoprolol -Appreciate  cardiology follow-up and recommendations. -Personally reviewed EKG--atrial fibrillation, nonspecific T wave changes -Patient will be transferred to [facility] for further evaluation and management. -Case discussed with cardiology service and they will assume patient's care once hemostasis current. -Denying palpitations or chest pain currently.  COVID-19 infection -Initially tested + 07/10/2019 -Repeat SARS-CoV2 RT-PCR is negative -CRP 1.2 -Ferritin 161 -D-dimer 0.37 -No respiratory complaints.  Coagulopathy -INR has trended up 3.3>>> 3.5>>>3.9>>>2.7 (goal is for INR less than 2 for heart cath). -While planning possible cath later today 07/26/2019; [place] Vit K and FFP orderd by cardiology service.  -pharmacy to assist with heparin drip once INR < 2 -Follow INR trend  Hyperlipidemia -Continue statin  Diabetes mellitus type 2 -Holding metformin and glipizide -Hemoglobin A1c 7.6 -Continue NovoLog sliding scale  Essential hypertension -Vital signs stable -Calcium channel blockers and beta-blockers discontinued in the setting of bradycardia.  Hyponatremia -due to volume depletion -Sodium level stable -IV fluids discontinued currently to prevent fluid overload.  Disposition Plan:   Transfer to [facility] telemetry bed for anticipated heart cath and further evaluation/management by cardiology service.  Still waiting on bed for patient to be transferred; possible transfer straight to heart cath later today..   Family Communication:   No Family at bedside  Consultants:  cardiology  Code Status:  FULL   DVT Prophylaxis:  coumadin   Procedures: As Listed in Progress Note Above  Antibiotics: None   Subjective: No fever, no chest pain, no vomiting and no acute distress.  Objective: Vitals:   07/26/19 0512 07/26/19 0839 07/26/19 1215 07/26/19 1230  BP: (!) 171/59 (!) 154/62 (!) 128/45 (!) 135/41  Pulse: (!) 52  (!) 35 (!) 35  Resp: 20  17 16   Temp: 98.5 F (36.9 C)  97.6 F (36.4 C) 97.9 F (36.6 C)  TempSrc: Oral  Oral Oral  SpO2: 100%  100% 100%  Weight:      Height:        Intake/Output Summary (Last 24 hours) at 07/26/2019 1245 Last data filed at 07/26/2019 1230 Gross per 24 hour  Intake 150 ml  Output --  Net 150 ml   Weight change:    Exam: General exam: Alert, awake, oriented x 3; no chest pain, no nausea, no vomiting.  Patient reports no dizziness and would like to go home.  No palpitations reported. Respiratory system: Clear to auscultation. Respiratory effort normal. Cardiovascular system: bradycardia. No murmurs, rubs, gallops. Gastrointestinal system: Abdomen is nondistended, soft and nontender. No organomegaly or masses felt. Normal bowel sounds heard. Central nervous system: Alert and oriented. No focal neurological deficits. Extremities: No C/C/E, +pedal pulses Skin: No rashes, lesions or ulcers Psychiatry: Judgement and insight appear normal. Mood & affect appropriate.   Data Reviewed: I have personally reviewed following labs and imaging studies.  Basic Metabolic Panel: Recent Labs  Lab 08/11/2019 0559 07/23/19 0514 07/24/19 0333 07/25/19 0804 07/26/19 0810  NA 129* 131* 127* 124* 123*  K 4.8 5.0 4.8 5.1 4.7  CL 96* 98 96* 95* 95*  CO2 20* 18* 17* 16* 16*  GLUCOSE 218* 165* 180* 194* 213*  BUN 16 24* 26* 20 19  CREATININE 1.66* 1.81* 1.81* 1.42* 1.47*  CALCIUM 9.0 9.0 8.5* 8.4* 8.3*  MG  --  1.9 1.8 1.8  --   PHOS  --  3.4 3.1  --   --    Liver Function Tests: Recent Labs  Lab 08/03/2019 0559 07/23/19 0514 07/24/19 0333 07/25/19 0804  AST 28 21 21 31   ALT 13 11 11 14   ALKPHOS 83 77 70 88  BILITOT 0.6 0.8 0.9 1.2  PROT 7.8 7.5 6.9 6.7  ALBUMIN 3.4* 3.3* 3.1* 3.1*   Coagulation Profile: Recent Labs  Lab 08/06/2019 1107 07/23/19 0900 07/24/19 0333 07/25/19 0804 07/26/19 0810  INR 3.3* 3.5* 3.9* 2.7* 2.7*   CBC: Recent Labs  Lab 08/01/2019 0559 07/23/19 0514 07/24/19 0333 07/25/19 0804  WBC 8.1  7.9 8.6 9.6  NEUTROABS 5.7 4.6 5.3 7.4  HGB 12.2 12.1 10.5* 10.7*  HCT 37.4 38.1 33.2* 34.5*  MCV 86.4 87.0 86.5 89.8  PLT 364 394 402* 372   CBG: Recent Labs  Lab 07/25/19 1139 07/25/19 1705 07/25/19 2136 07/26/19 0752 07/26/19 1146  GLUCAP 198* 203* 164* 208* 195*   Urine analysis:    Component Value Date/Time   COLORURINE YELLOW 04/23/2017 0049   APPEARANCEUR HAZY (A) 04/23/2017 0049   LABSPEC 1.021 04/23/2017 0049   PHURINE 5.0 04/23/2017 0049   GLUCOSEU NEGATIVE 04/23/2017 0049   GLUCOSEU NEG mg/dL 07/06/2009 1903   HGBUR MODERATE (A) 04/23/2017 0049   BILIRUBINUR NEGATIVE 04/23/2017 0049   KETONESUR NEGATIVE 04/23/2017 0049   PROTEINUR 100 (A) 04/23/2017 0049   UROBILINOGEN 1 07/06/2009 1903   NITRITE NEGATIVE 04/23/2017 0049   LEUKOCYTESUR NEGATIVE 04/23/2017 0049    Recent Results (from the past 240 hour(s))  Respiratory Panel by RT PCR (Flu A&B, Covid) - Nasopharyngeal Swab     Status: None   Collection Time: 08/03/2019  8:42 AM   Specimen: Nasopharyngeal Swab  Result Value Ref Range Status   SARS Coronavirus 2 by RT PCR NEGATIVE NEGATIVE Final    Comment: (NOTE) SARS-CoV-2 target nucleic acids are NOT DETECTED. The SARS-CoV-2 RNA is generally detectable in upper respiratoy  specimens during the acute phase of infection. The lowest concentration of SARS-CoV-2 viral copies this assay can detect is 131 copies/mL. A negative result does not preclude SARS-Cov-2 infection and should not be used as the sole basis for treatment or other patient management decisions. A negative result may occur with  improper specimen collection/handling, submission of specimen other than nasopharyngeal swab, presence of viral mutation(s) within the areas targeted by this assay, and inadequate number of viral copies (<131 copies/mL). A negative result must be combined with clinical observations, patient history, and epidemiological information. The expected result is  Negative. Fact Sheet for Patients:  PinkCheek.be Fact Sheet for Healthcare Providers:  GravelBags.it This test is not yet ap proved or cleared by the [place] FDA and  has been authorized for detection and/or diagnosis of SARS-CoV-2 by FDA under an Emergency Use Authorization (EUA). This EUA will remain  in effect (meaning this test can be used) for the duration of the COVID-19 declaration under Section 564(b)(1) of the Act, 21 U.S.C. section 360bbb-3(b)(1), unless the authorization is terminated or revoked sooner.    Influenza A by PCR NEGATIVE NEGATIVE Final   Influenza B by PCR NEGATIVE NEGATIVE Final    Comment: (NOTE) The Xpert Xpress SARS-CoV-2/FLU/RSV assay is intended as an aid in  the diagnosis of influenza from Nasopharyngeal swab specimens and  should not be used as a sole basis for treatment. Nasal washings and  aspirates are unacceptable for Xpert Xpress SARS-CoV-2/FLU/RSV  testing. Fact Sheet for Patients: PinkCheek.be Fact Sheet for Healthcare Providers: GravelBags.it This test is not yet approved or cleared by the [place] FDA and  has been authorized for detection and/or diagnosis of SARS-CoV-2 by  FDA under an Emergency Use Authorization (EUA). This EUA will remain  in effect (meaning this test can be used) for the duration of the  Covid-19 declaration under Section 564(b)(1) of the Act, 21  U.S.C. section 360bbb-3(b)(1), unless the authorization is  terminated or revoked. Performed at [facility], [address] [place], [place] [address]      Scheduled Meds:  albuterol  2 puff Inhalation Q6H   amLODipine  7.5 mg Oral Daily   vitamin C  500 mg Oral Daily   aspirin EC  81 mg Oral Daily   insulin aspart  0-5 Units Subcutaneous QHS   insulin aspart  0-9 Units Subcutaneous TID WC   multivitamin with minerals  1 tablet Oral Daily    pantoprazole  40 mg Oral Daily   simvastatin  20 mg Oral QHS   sodium chloride flush  3 mL Intravenous Q12H   sodium chloride flush  3 mL Intravenous Q12H   zinc sulfate  220 mg Oral Daily   Continuous Infusions:  sodium chloride      Procedures/Studies: DG Chest Port 1 View  Result Date: 08/16/2019 CLINICAL DATA:  Vomiting and shortness of breath. EXAM: PORTABLE CHEST 1 VIEW COMPARISON:  01/09/2008 FINDINGS: Normal heart size and mediastinal contours. There is no edema, consolidation, effusion, or pneumothorax. No osseous findings. IMPRESSION: No evidence of active disease. Electronically Signed   By: Monte Fantasia M.D.   On: 07/26/2019 06:03   ECHOCARDIOGRAM COMPLETE  Result Date: 07/25/2019   ECHOCARDIOGRAM REPORT   Patient Name:   NIKIE QADRI Date of Exam: 08/16/2019 Medical Rec #:  [id number]    Height:       62.0 in Accession #:    [id number]   Weight:       162.0 lb Date  of Birth:  02/10/1933    BSA:          1.75 m Patient Age:    60 years     BP:           144/67 mmHg Patient Gender: F            HR:           40 bpm. Exam Location:  [facility] Procedure: 2D Echo, Cardiac Doppler and Color Doppler Indications:    Chest Pain 786.50 / R07.9  History:        Patient has prior history of Echocardiogram examinations, most                 recent 01/14/2008. Arrythmias:Atrial Fibrillation; Risk                 Factors:Hypertension, Diabetes and Dyslipidemia. Chronic                 anticoagulation,Hx of Adenocarcinoma of colon.  Sonographer:    BW Referring Phys: [place]  1. Left ventricular ejection fraction, by visual estimation, is 60 to 65%. The left ventricle has normal function. There is mildly increased left ventricular hypertrophy.  2. Left ventricular diastolic parameters are indeterminate.  3. The left ventricle demonstrates regional wall motion abnormalities. Apparent inferior basal akinesis.  4. Global right ventricle has normal systolic function.The  right ventricular size is normal. No increase in right ventricular wall thickness.  5. Left atrial size was severely dilated.  6. Right atrial size was moderately dilated.  7. The mitral valve is grossly normal. Moderate mitral valve regurgitation.  8. The tricuspid valve is grossly normal.  9. The aortic valve is tricuspid. Aortic valve regurgitation is mild. 10. The pulmonic valve was grossly normal. Pulmonic valve regurgitation is trivial. 11. Moderately elevated pulmonary artery systolic pressure. 12. The inferior vena cava is normal in size with greater than 50% respiratory variability, suggesting right atrial pressure of 3 mmHg. 13. Evidence of atrial level shunting detected by color flow Doppler. 14. The tricuspid regurgitant velocity is 3.23 m/s, and with an assumed right atrial pressure of 3 mmHg, the estimated right ventricular systolic pressure is moderately elevated at 44.7 mmHg. FINDINGS  Left Ventricle: Left ventricular ejection fraction, by visual estimation, is 60 to 65%. The left ventricle has normal function. The left ventricle demonstrates regional wall motion abnormalities. The left ventricular internal cavity size was the left ventricle is normal in size. There is mildly increased left ventricular hypertrophy. Left ventricular diastolic parameters are indeterminate. Right Ventricle: The right ventricular size is normal. No increase in right ventricular wall thickness. Global RV systolic function is has normal systolic function. The tricuspid regurgitant velocity is 3.23 m/s, and with an assumed right atrial pressure  of 3 mmHg, the estimated right ventricular systolic pressure is moderately elevated at 44.7 mmHg. Left Atrium: Left atrial size was severely dilated. Right Atrium: Right atrial size was moderately dilated Pericardium: There is no evidence of pericardial effusion. Mitral Valve: The mitral valve is grossly normal. Moderate mitral valve regurgitation. Tricuspid Valve: The tricuspid  valve is grossly normal. Tricuspid valve regurgitation moderate. Aortic Valve: The aortic valve is tricuspid. Aortic valve regurgitation is mild. Mild aortic valve annular calcification. Pulmonic Valve: The pulmonic valve was grossly normal. Pulmonic valve regurgitation is trivial. Pulmonic regurgitation is trivial. Aorta: The aortic root is normal in size and structure. Venous: The inferior vena cava is normal in size with greater than  50% respiratory variability, suggesting right atrial pressure of 3 mmHg. IAS/Shunts: Evidence of atrial level shunting detected by color flow Doppler.  LEFT VENTRICLE PLAX 2D LVIDd:         3.36 cm LVIDs:         1.95 cm LV PW:         1.02 cm LV IVS:        1.13 cm LVOT diam:     1.60 cm LV SV:         34 ml LV SV Index:   18.79 LVOT Area:     2.01 cm  RIGHT VENTRICLE TAPSE (M-mode): 1.9 cm LEFT ATRIUM             Index       RIGHT ATRIUM           Index LA diam:        3.30 cm 1.89 cm/m  RA Area:     24.00 cm LA Vol (A2C):   78.6 ml 44.97 ml/m RA Volume:   79.80 ml  45.65 ml/m LA Vol (A4C):   95.1 ml 54.41 ml/m LA Biplane Vol: 88.4 ml 50.57 ml/m  AORTIC VALVE LVOT Vmax:   86.15 cm/s LVOT Vmean:  60.850 cm/s LVOT VTI:    0.211 m  AORTA Ao Root diam: 3.30 cm MITRAL VALVE                         TRICUSPID VALVE MV Area (PHT): 3.31 cm              TR Peak grad:   41.7 mmHg MV PHT:        66.41 msec            TR Vmax:        323.00 cm/s MV Decel Time: 229 msec MV E velocity: 117.00 cm/s 103 cm/s  SHUNTS MV A velocity: 21.40 cm/s  70.3 cm/s Systemic VTI:  0.21 m MV E/A ratio:  5.47        1.5       Systemic Diam: 1.60 cm  Rozann Lesches MD Electronically signed by Rozann Lesches MD Signature Date/Time: 07/25/2019/2:50:08 PM    Final     Barton Dubois, MD  [facility] Pager [phone]  07/26/2019, 12:45 PM   LOS: 4 days

## 2019-07-26 NOTE — Care Management Important Message (Signed)
Important Message  Patient Details  Name: [name] MRN: [id number] Date of Birth: 31-Dec-1932   Medicare Important Message Given:  Yes(Shannon, RN will deliver letter to patient due to contact precautions)     [name] 07/26/2019, 3:16 PM

## 2019-07-27 DIAGNOSIS — E1121 Type 2 diabetes mellitus with diabetic nephropathy: Secondary | ICD-10-CM | POA: Diagnosis not present

## 2019-07-27 DIAGNOSIS — I482 Chronic atrial fibrillation, unspecified: Secondary | ICD-10-CM | POA: Diagnosis not present

## 2019-07-27 DIAGNOSIS — I10 Essential (primary) hypertension: Secondary | ICD-10-CM | POA: Diagnosis not present

## 2019-07-27 DIAGNOSIS — I214 Non-ST elevation (NSTEMI) myocardial infarction: Secondary | ICD-10-CM | POA: Diagnosis not present

## 2019-07-27 DIAGNOSIS — N179 Acute kidney failure, unspecified: Secondary | ICD-10-CM | POA: Diagnosis not present

## 2019-07-27 DIAGNOSIS — I4891 Unspecified atrial fibrillation: Secondary | ICD-10-CM | POA: Diagnosis not present

## 2019-07-27 DIAGNOSIS — N1832 Chronic kidney disease, stage 3b: Secondary | ICD-10-CM | POA: Diagnosis not present

## 2019-07-27 DIAGNOSIS — I209 Angina pectoris, unspecified: Secondary | ICD-10-CM | POA: Diagnosis not present

## 2019-07-27 DIAGNOSIS — Z7901 Long term (current) use of anticoagulants: Secondary | ICD-10-CM | POA: Diagnosis not present

## 2019-07-27 LAB — BPAM FFP
Blood Product Expiration Date: 202101132359
Blood Product Expiration Date: 202101132359
ISSUE DATE / TIME: 202101081207
ISSUE DATE / TIME: 202101081400
Unit Type and Rh: 2800
Unit Type and Rh: 8400

## 2019-07-27 LAB — PREPARE FRESH FROZEN PLASMA
Unit division: 0
Unit division: 0

## 2019-07-27 LAB — GLUCOSE, CAPILLARY
Glucose-Capillary: 194 mg/dL — ABNORMAL HIGH (ref 70–99)
Glucose-Capillary: 197 mg/dL — ABNORMAL HIGH (ref 70–99)
Glucose-Capillary: 213 mg/dL — ABNORMAL HIGH (ref 70–99)

## 2019-07-27 MED ORDER — AMLODIPINE BESYLATE 5 MG PO TABS: 5.0000 mg | ORAL_TABLET | Freq: Every day | ORAL | Status: DC

## 2019-07-27 MED ORDER — HYDRALAZINE HCL 20 MG/ML IJ SOLN: 10.0000 mg | Freq: Three times a day (TID) | INTRAMUSCULAR | Status: DC | PRN

## 2019-07-27 MED ORDER — BOOST / RESOURCE BREEZE PO LIQD CUSTOM
1.0000 | Freq: Three times a day (TID) | ORAL | Status: DC
  Administered 2019-07-27 – 2019-07-29 (×6): 1 via ORAL

## 2019-07-27 NOTE — Progress Notes (Signed)
Received report from [facility]. [facility] called for transport. Pt not yet on unit. Gave report to night shift RN.

## 2019-07-27 NOTE — Progress Notes (Signed)
PROGRESS NOTE  Katrina Arnold [id number] DOB: 09-20-1932 DOA: 07/23/2019 PCP: Rosita Fire, MD  Brief History:  86 year old female with a history of chronic atrial fibrillation on warfarin, hypertension, hyperlipidemia, diabetes mellitus type 2 presenting with chest discomfort and shortness of breath that woke her up from bed on the morning of 08/14/2019.  She had some associated nausea and emesis eventually.  She has some associated malaise with nausea and myalgias.  She was diagnosed with COVID-19 on 07/10/2019 and her husband is currently admitted to [facility] 2 days prior to this admission.  Regarding her COVID-19 diagnosis, the patient had been fairly asymptomatic prior to the onset of her current symptoms for this admission. The patient was found to have atrial fibrillation with slow ventricular response with heart rate as low as 30s.  Cardiology was consulted to assist with management.  Troponins were elevated with a peak of 578.  There was concern for ACS. Repeat SARS-CoV2 RT-PCR is negative.  Assessment/Plan: elevated troponin/Angina Pectoris -Concern for ACS -Troponins 578>>> 506>>> 325 -Appreciate cardiology consult and recommendations. -Optimizing renal function and coagulopathy prior to transfer for heart catheterization. -Bed requested for telemetry bed at [facility] today -Continue to follow INR; patient renal function now stabilized. -case discussed with cardiology, Dr. Johnsie Cancel and PA Bernerd Pho on 07/26/2019. -Continue ASA 81 mg -Reschedule heart cath for 07/29/2019.  Acute on chronic renal failure--CKD stage III -Baseline creatinine 1.1-1.4 -Serum creatinine peaked and is still 1.8 -Continue holding HCTZ -Creatinine now back to baseline at 1.42 -Continue minimizing/avoiding nephrotoxic agents.  Follow renal function trend.  Persistent atrial fibrillation with slow ventricular response -Continue holding metoprolol -Appreciate cardiology  follow-up and recommendations. -Personally reviewed EKG--atrial fibrillation, nonspecific T wave changes -Patient will be transferred to [facility] for further evaluation and management.  Heart cath anticipated for 07/29/2019. -Continue to follow recommendations by cardiology service. -Denying palpitations or chest pain currently.  COVID-19 infection -Initially tested + 07/10/2019 -Repeat SARS-CoV2 RT-PCR is negative -CRP 1.2 -Ferritin 161 -D-dimer 0.37 -No respiratory complaints. -Continue vitamin C, zinc and supportive care.  No other treatment indicated currently.  Coagulopathy -INR has trended up 3.3>>> 3.5>>>3.9>>>2.7 (goal is for INR less than 2 for heart cath). -Follow INR trend. -pharmacy to assist with heparin drip once INR < 2  Hyperlipidemia -Continue statin  Diabetes mellitus type 2 -Continue holding metformin and glipizide -Hemoglobin A1c 7.6 -Continue NovoLog sliding scale  Essential hypertension -Vital signs stable -Calcium channel blockers and beta-blockers discontinued in the setting of bradycardia. -As needed hydralazine has been ordered.  Hyponatremia -due to volume depletion -Corrected sodium level around 127 -Continue to follow electrolytes trend.   Disposition Plan:   Transfer to [facility] telemetry bed for anticipated heart cath and further evaluation/management by cardiology service.  Still waiting on bed for patient to be transferred; patient heart cath has been now rescheduled for 07/29/2019.   Family Communication:   No Family at bedside  Consultants:  cardiology  Code Status:  FULL   DVT Prophylaxis:  coumadin   Procedures: As Listed in Progress Note Above  Antibiotics: None   Subjective: No fever, no chest pain, no vomiting and denies palpitations.  Patient expressed no lightheadedness or dizziness.  She is very weak, deconditioned and expressed poor appetite.  Objective: Vitals:   07/26/19 2056 07/27/19 0500 07/27/19 0521  07/27/19 0757  BP: (!) 137/50  (!) 146/46 (!) 146/47  Pulse: 71  (!) 44 Marland Kitchen)  43  Resp: 17  18 18   Temp: 97.7 F (36.5 C)   (!) 97.4 F (36.3 C)  TempSrc: Oral  Oral Oral  SpO2: 100%  100% 100%  Weight:  75.3 kg    Height:       No intake or output data in the 24 hours ending 07/27/19 1458 Weight change:    Exam: General exam: Alert, awake, oriented x 3, reports feeling weak and deconditioned.  No chest pain, no nausea, no vomiting, no abdominal pain.  Patient complaining of anorexia and also expressed some difficulty chewing meals without dentures.  No dizziness or lightheadedness reported. Respiratory system: Clear to auscultation. Respiratory effort normal. Cardiovascular system: Positive for bradycardia; no rubs, no gallops, no murmurs, no JVD.   Gastrointestinal system: Abdomen is nondistended, soft and nontender. No organomegaly or masses felt. Normal bowel sounds heard. Central nervous system: Alert and oriented. No focal neurological deficits. Extremities: No C/C/E, +pedal pulses Skin: No rashes, lesions or ulcers Psychiatry: Judgement and insight appear normal. Mood & affect appropriate.    Data Reviewed: I have personally reviewed following labs and imaging studies.  Basic Metabolic Panel: Recent Labs  Lab 08/13/2019 0559 07/23/19 0514 07/24/19 0333 07/25/19 0804 07/26/19 0810  NA 129* 131* 127* 124* 123*  K 4.8 5.0 4.8 5.1 4.7  CL 96* 98 96* 95* 95*  CO2 20* 18* 17* 16* 16*  GLUCOSE 218* 165* 180* 194* 213*  BUN 16 24* 26* 20 19  CREATININE 1.66* 1.81* 1.81* 1.42* 1.47*  CALCIUM 9.0 9.0 8.5* 8.4* 8.3*  MG  --  1.9 1.8 1.8  --   PHOS  --  3.4 3.1  --   --    Liver Function Tests: Recent Labs  Lab 07/24/2019 0559 07/23/19 0514 07/24/19 0333 07/25/19 0804  AST 28 21 21 31   ALT 13 11 11 14   ALKPHOS 83 77 70 88  BILITOT 0.6 0.8 0.9 1.2  PROT 7.8 7.5 6.9 6.7  ALBUMIN 3.4* 3.3* 3.1* 3.1*   Coagulation Profile: Recent Labs  Lab 08/16/2019 1107  07/23/19 0900 07/24/19 0333 07/25/19 0804 07/26/19 0810  INR 3.3* 3.5* 3.9* 2.7* 2.7*   CBC: Recent Labs  Lab 07/21/2019 0559 07/23/19 0514 07/24/19 0333 07/25/19 0804  WBC 8.1 7.9 8.6 9.6  NEUTROABS 5.7 4.6 5.3 7.4  HGB 12.2 12.1 10.5* 10.7*  HCT 37.4 38.1 33.2* 34.5*  MCV 86.4 87.0 86.5 89.8  PLT 364 394 402* 372   CBG: Recent Labs  Lab 07/26/19 0752 07/26/19 1146 07/26/19 1647 07/26/19 2301 07/27/19 1141  GLUCAP 208* 195* 218* 157* 194*   Urine analysis:    Component Value Date/Time   COLORURINE YELLOW 04/23/2017 0049   APPEARANCEUR HAZY (A) 04/23/2017 0049   LABSPEC 1.021 04/23/2017 0049   PHURINE 5.0 04/23/2017 0049   GLUCOSEU NEGATIVE 04/23/2017 0049   GLUCOSEU NEG mg/dL 07/06/2009 1903   HGBUR MODERATE (A) 04/23/2017 0049   BILIRUBINUR NEGATIVE 04/23/2017 0049   KETONESUR NEGATIVE 04/23/2017 0049   PROTEINUR 100 (A) 04/23/2017 0049   UROBILINOGEN 1 07/06/2009 1903   NITRITE NEGATIVE 04/23/2017 0049   LEUKOCYTESUR NEGATIVE 04/23/2017 0049    Recent Results (from the past 240 hour(s))  Respiratory Panel by RT PCR (Flu A&B, Covid) - Nasopharyngeal Swab     Status: None   Collection Time: 07/19/2019  8:42 AM   Specimen: Nasopharyngeal Swab  Result Value Ref Range Status   SARS Coronavirus 2 by RT PCR NEGATIVE NEGATIVE Final    Comment: (NOTE)  SARS-CoV-2 target nucleic acids are NOT DETECTED. The SARS-CoV-2 RNA is generally detectable in upper respiratoy specimens during the acute phase of infection. The lowest concentration of SARS-CoV-2 viral copies this assay can detect is 131 copies/mL. A negative result does not preclude SARS-Cov-2 infection and should not be used as the sole basis for treatment or other patient management decisions. A negative result may occur with  improper specimen collection/handling, submission of specimen other than nasopharyngeal swab, presence of viral mutation(s) within the areas targeted by this assay, and inadequate  number of viral copies (<131 copies/mL). A negative result must be combined with clinical observations, patient history, and epidemiological information. The expected result is Negative. Fact Sheet for Patients:  PinkCheek.be Fact Sheet for Healthcare Providers:  GravelBags.it This test is not yet ap proved or cleared by the [place] FDA and  has been authorized for detection and/or diagnosis of SARS-CoV-2 by FDA under an Emergency Use Authorization (EUA). This EUA will remain  in effect (meaning this test can be used) for the duration of the COVID-19 declaration under Section 564(b)(1) of the Act, 21 U.S.C. section 360bbb-3(b)(1), unless the authorization is terminated or revoked sooner.    Influenza A by PCR NEGATIVE NEGATIVE Final   Influenza B by PCR NEGATIVE NEGATIVE Final    Comment: (NOTE) The Xpert Xpress SARS-CoV-2/FLU/RSV assay is intended as an aid in  the diagnosis of influenza from Nasopharyngeal swab specimens and  should not be used as a sole basis for treatment. Nasal washings and  aspirates are unacceptable for Xpert Xpress SARS-CoV-2/FLU/RSV  testing. Fact Sheet for Patients: PinkCheek.be Fact Sheet for Healthcare Providers: GravelBags.it This test is not yet approved or cleared by the [place] FDA and  has been authorized for detection and/or diagnosis of SARS-CoV-2 by  FDA under an Emergency Use Authorization (EUA). This EUA will remain  in effect (meaning this test can be used) for the duration of the  Covid-19 declaration under Section 564(b)(1) of the Act, 21  U.S.C. section 360bbb-3(b)(1), unless the authorization is  terminated or revoked. Performed at [facility], [address] [place], [place] [address]      Scheduled Meds: . [START ON 07/28/2019] amLODipine  5 mg Oral Daily  . vitamin C  500 mg Oral Daily  . aspirin EC  81  mg Oral Daily  . feeding supplement  1 Container Oral TID BM  . insulin aspart  0-5 Units Subcutaneous QHS  . insulin aspart  0-9 Units Subcutaneous TID WC  . multivitamin with minerals  1 tablet Oral Daily  . pantoprazole  40 mg Oral Daily  . simvastatin  20 mg Oral QHS  . sodium chloride flush  3 mL Intravenous Q12H  . sodium chloride flush  3 mL Intravenous Q12H  . zinc sulfate  220 mg Oral Daily   Continuous Infusions: . sodium chloride      Procedures/Studies: DG Chest Port 1 View  Result Date: 08/16/2019 CLINICAL DATA:  Vomiting and shortness of breath. EXAM: PORTABLE CHEST 1 VIEW COMPARISON:  01/09/2008 FINDINGS: Normal heart size and mediastinal contours. There is no edema, consolidation, effusion, or pneumothorax. No osseous findings. IMPRESSION: No evidence of active disease. Electronically Signed   By: Monte Fantasia M.D.   On: 08/01/2019 06:03   ECHOCARDIOGRAM COMPLETE  Result Date: 08/18/2019   ECHOCARDIOGRAM REPORT   Patient Name:   Katrina Arnold Date of Exam: 08/03/2019 Medical Rec #:  [id number]    Height:  62.0 in Accession #:    [id number]   Weight:       162.0 lb Date of Birth:  [birth year]    BSA:          1.75 m Patient Age:    89 years     BP:           144/67 mmHg Patient Gender: F            HR:           40 bpm. Exam Location:  [facility] Procedure: 2D Echo, Cardiac Doppler and Color Doppler Indications:    Chest Pain 786.50 / R07.9  History:        Patient has prior history of Echocardiogram examinations, most                 recent 01/14/2008. Arrythmias:Atrial Fibrillation; Risk                 Factors:Hypertension, Diabetes and Dyslipidemia. Chronic                 anticoagulation,Hx of Adenocarcinoma of colon.  Sonographer:    BW Referring Phys: [place]  1. Left ventricular ejection fraction, by visual estimation, is 60 to 65%. The left ventricle has normal function. There is mildly increased left ventricular hypertrophy.  2. Left ventricular  diastolic parameters are indeterminate.  3. The left ventricle demonstrates regional wall motion abnormalities. Apparent inferior basal akinesis.  4. Global right ventricle has normal systolic function.The right ventricular size is normal. No increase in right ventricular wall thickness.  5. Left atrial size was severely dilated.  6. Right atrial size was moderately dilated.  7. The mitral valve is grossly normal. Moderate mitral valve regurgitation.  8. The tricuspid valve is grossly normal.  9. The aortic valve is tricuspid. Aortic valve regurgitation is mild. 10. The pulmonic valve was grossly normal. Pulmonic valve regurgitation is trivial. 11. Moderately elevated pulmonary artery systolic pressure. 12. The inferior vena cava is normal in size with greater than 50% respiratory variability, suggesting right atrial pressure of 3 mmHg. 13. Evidence of atrial level shunting detected by color flow Doppler. 14. The tricuspid regurgitant velocity is 3.23 m/s, and with an assumed right atrial pressure of 3 mmHg, the estimated right ventricular systolic pressure is moderately elevated at 44.7 mmHg. FINDINGS  Left Ventricle: Left ventricular ejection fraction, by visual estimation, is 60 to 65%. The left ventricle has normal function. The left ventricle demonstrates regional wall motion abnormalities. The left ventricular internal cavity size was the left ventricle is normal in size. There is mildly increased left ventricular hypertrophy. Left ventricular diastolic parameters are indeterminate. Right Ventricle: The right ventricular size is normal. No increase in right ventricular wall thickness. Global RV systolic function is has normal systolic function. The tricuspid regurgitant velocity is 3.23 m/s, and with an assumed right atrial pressure  of 3 mmHg, the estimated right ventricular systolic pressure is moderately elevated at 44.7 mmHg. Left Atrium: Left atrial size was severely dilated. Right Atrium: Right atrial  size was moderately dilated Pericardium: There is no evidence of pericardial effusion. Mitral Valve: The mitral valve is grossly normal. Moderate mitral valve regurgitation. Tricuspid Valve: The tricuspid valve is grossly normal. Tricuspid valve regurgitation moderate. Aortic Valve: The aortic valve is tricuspid. Aortic valve regurgitation is mild. Mild aortic valve annular calcification. Pulmonic Valve: The pulmonic valve was grossly normal. Pulmonic valve regurgitation is trivial. Pulmonic regurgitation is trivial. Aorta: The  aortic root is normal in size and structure. Venous: The inferior vena cava is normal in size with greater than 50% respiratory variability, suggesting right atrial pressure of 3 mmHg. IAS/Shunts: Evidence of atrial level shunting detected by color flow Doppler.  LEFT VENTRICLE PLAX 2D LVIDd:         3.36 cm LVIDs:         1.95 cm LV PW:         1.02 cm LV IVS:        1.13 cm LVOT diam:     1.60 cm LV SV:         34 ml LV SV Index:   18.79 LVOT Area:     2.01 cm  RIGHT VENTRICLE TAPSE (M-mode): 1.9 cm LEFT ATRIUM             Index       RIGHT ATRIUM           Index LA diam:        3.30 cm 1.89 cm/m  RA Area:     24.00 cm LA Vol (A2C):   78.6 ml 44.97 ml/m RA Volume:   79.80 ml  45.65 ml/m LA Vol (A4C):   95.1 ml 54.41 ml/m LA Biplane Vol: 88.4 ml 50.57 ml/m  AORTIC VALVE LVOT Vmax:   86.15 cm/s LVOT Vmean:  60.850 cm/s LVOT VTI:    0.211 m  AORTA Ao Root diam: 3.30 cm MITRAL VALVE                         TRICUSPID VALVE MV Area (PHT): 3.31 cm              TR Peak grad:   41.7 mmHg MV PHT:        66.41 msec            TR Vmax:        323.00 cm/s MV Decel Time: 229 msec MV E velocity: 117.00 cm/s 103 cm/s  SHUNTS MV A velocity: 21.40 cm/s  70.3 cm/s Systemic VTI:  0.21 m MV E/A ratio:  5.47        1.5       Systemic Diam: 1.60 cm  Rozann Lesches MD Electronically signed by Rozann Lesches MD Signature Date/Time: 08/16/2019/2:50:08 PM    Final     Barton Dubois, MD  [facility] Pager [phone]  07/27/2019, 2:58 PM   LOS: 5 days

## 2019-07-27 NOTE — Progress Notes (Signed)
Patient to be transferred to [place]. Patient and her daughter Raquel James have been informed of transfer to [facility].  Report called and given to Purnell Shoemaker RN. All questions were answered and no further questions at this time. Patient in stable condition and in no acute distress at time of transfer. Patient will be transported by [facility].

## 2019-07-28 DIAGNOSIS — I4811 Longstanding persistent atrial fibrillation: Secondary | ICD-10-CM | POA: Diagnosis not present

## 2019-07-28 DIAGNOSIS — I209 Angina pectoris, unspecified: Secondary | ICD-10-CM | POA: Diagnosis not present

## 2019-07-28 LAB — CBC WITH DIFFERENTIAL/PLATELET
Abs Immature Granulocytes: 0.27 10*3/uL — ABNORMAL HIGH (ref 0.00–0.07)
Basophils Absolute: 0 10*3/uL (ref 0.0–0.1)
Basophils Relative: 0 %
Eosinophils Absolute: 0 10*3/uL (ref 0.0–0.5)
Eosinophils Relative: 0 %
HCT: 30.5 % — ABNORMAL LOW (ref 36.0–46.0)
Hemoglobin: 10.3 g/dL — ABNORMAL LOW (ref 12.0–15.0)
Immature Granulocytes: 3 %
Lymphocytes Relative: 19 %
Lymphs Abs: 2 10*3/uL (ref 0.7–4.0)
MCH: 28.1 pg (ref 26.0–34.0)
MCHC: 33.8 g/dL (ref 30.0–36.0)
MCV: 83.1 fL (ref 80.0–100.0)
Monocytes Absolute: 1.3 10*3/uL — ABNORMAL HIGH (ref 0.1–1.0)
Monocytes Relative: 12 %
Neutro Abs: 7 10*3/uL (ref 1.7–7.7)
Neutrophils Relative %: 66 %
Platelets: 233 10*3/uL (ref 150–400)
RBC: 3.67 MIL/uL — ABNORMAL LOW (ref 3.87–5.11)
RDW: 15.1 % (ref 11.5–15.5)
WBC: 10.6 10*3/uL — ABNORMAL HIGH (ref 4.0–10.5)
nRBC: 1.2 % — ABNORMAL HIGH (ref 0.0–0.2)

## 2019-07-28 LAB — SODIUM
Sodium: 120 mmol/L — ABNORMAL LOW (ref 135–145)
Sodium: 121 mmol/L — ABNORMAL LOW (ref 135–145)

## 2019-07-28 LAB — BASIC METABOLIC PANEL
Anion gap: 14 (ref 5–15)
BUN: 43 mg/dL — ABNORMAL HIGH (ref 8–23)
CO2: 15 mmol/L — ABNORMAL LOW (ref 22–32)
Calcium: 8 mg/dL — ABNORMAL LOW (ref 8.9–10.3)
Chloride: 90 mmol/L — ABNORMAL LOW (ref 98–111)
Creatinine, Ser: 2.37 mg/dL — ABNORMAL HIGH (ref 0.44–1.00)
GFR calc Af Amer: 21 mL/min — ABNORMAL LOW (ref >60)
GFR calc non Af Amer: 18 mL/min — ABNORMAL LOW (ref >60)
Glucose, Bld: 206 mg/dL — ABNORMAL HIGH (ref 70–99)
Potassium: 5.6 mmol/L — ABNORMAL HIGH (ref 3.5–5.1)
Sodium: 119 mmol/L — CL (ref 135–145)

## 2019-07-28 LAB — COMPREHENSIVE METABOLIC PANEL
ALT: 138 U/L — ABNORMAL HIGH (ref 0–44)
AST: 286 U/L — ABNORMAL HIGH (ref 15–41)
Albumin: 3.2 g/dL — ABNORMAL LOW (ref 3.5–5.0)
Alkaline Phosphatase: 102 U/L (ref 38–126)
Anion gap: 15 (ref 5–15)
BUN: 39 mg/dL — ABNORMAL HIGH (ref 8–23)
CO2: 13 mmol/L — ABNORMAL LOW (ref 22–32)
Calcium: 8.2 mg/dL — ABNORMAL LOW (ref 8.9–10.3)
Chloride: 90 mmol/L — ABNORMAL LOW (ref 98–111)
Creatinine, Ser: 2.37 mg/dL — ABNORMAL HIGH (ref 0.44–1.00)
GFR calc Af Amer: 21 mL/min — ABNORMAL LOW (ref >60)
GFR calc non Af Amer: 18 mL/min — ABNORMAL LOW (ref >60)
Glucose, Bld: 253 mg/dL — ABNORMAL HIGH (ref 70–99)
Potassium: 5.2 mmol/L — ABNORMAL HIGH (ref 3.5–5.1)
Sodium: 118 mmol/L — CL (ref 135–145)
Total Bilirubin: 1.8 mg/dL — ABNORMAL HIGH (ref 0.3–1.2)
Total Protein: 6.6 g/dL (ref 6.5–8.1)

## 2019-07-28 LAB — MAGNESIUM: Magnesium: 2.1 mg/dL (ref 1.7–2.4)

## 2019-07-28 LAB — GLUCOSE, CAPILLARY
Glucose-Capillary: 262 mg/dL — ABNORMAL HIGH (ref 70–99)
Glucose-Capillary: 218 mg/dL — ABNORMAL HIGH (ref 70–99)
Glucose-Capillary: 211 mg/dL — ABNORMAL HIGH (ref 70–99)
Glucose-Capillary: 187 mg/dL — ABNORMAL HIGH (ref 70–99)

## 2019-07-28 LAB — HEPARIN LEVEL (UNFRACTIONATED): Heparin Unfractionated: 0.19 IU/mL — ABNORMAL LOW (ref 0.30–0.70)

## 2019-07-28 LAB — PHOSPHORUS: Phosphorus: 3.4 mg/dL (ref 2.5–4.6)

## 2019-07-28 LAB — PROTIME-INR
INR: 1.7 — ABNORMAL HIGH (ref 0.8–1.2)
Prothrombin Time: 20 seconds — ABNORMAL HIGH (ref 11.4–15.2)

## 2019-07-28 LAB — TROPONIN I (HIGH SENSITIVITY): Troponin I (High Sensitivity): 153 ng/L (ref <18)

## 2019-07-28 MED ORDER — HEPARIN BOLUS VIA INFUSION
2000.0000 [IU] | Freq: Once | INTRAVENOUS | Status: AC
  Administered 2019-07-28: 2000 [IU] via INTRAVENOUS
  Filled 2019-07-28: qty 2000

## 2019-07-28 MED ORDER — STERILE WATER FOR INJECTION IV SOLN: INTRAVENOUS | Status: DC

## 2019-07-28 MED ORDER — STERILE WATER FOR INJECTION IV SOLN
INTRAVENOUS | Status: DC
  Filled 2019-07-28 (×5): qty 850

## 2019-07-28 MED ORDER — HEPARIN (PORCINE) 25000 UT/250ML-% IV SOLN
950.0000 [IU]/h | INTRAVENOUS | Status: DC
  Administered 2019-07-28: 800 [IU]/h via INTRAVENOUS
  Filled 2019-07-28: qty 250

## 2019-07-28 MED ORDER — SODIUM CHLORIDE 0.45 % IV SOLN: INTRAVENOUS | Status: DC

## 2019-07-28 NOTE — Progress Notes (Signed)
Updated the patient's daughter Ms. Butch Penny via phone.  All questions answered.

## 2019-07-28 NOTE — Progress Notes (Deleted)
Progress Note  Patient Name: Katrina Arnold Date of Encounter: 07/28/2019  Primary Cardiologist: Carlyle Dolly, MD   Subjective   Patient did not answer phone call into her room.  Inpatient Medications    Scheduled Meds: . vitamin C  500 mg Oral Daily  . aspirin EC  81 mg Oral Daily  . feeding supplement  1 Container Oral TID BM  . insulin aspart  0-5 Units Subcutaneous QHS  . insulin aspart  0-9 Units Subcutaneous TID WC  . multivitamin with minerals  1 tablet Oral Daily  . pantoprazole  40 mg Oral Daily  . simvastatin  20 mg Oral QHS  . sodium chloride flush  3 mL Intravenous Q12H  . sodium chloride flush  3 mL Intravenous Q12H  . zinc sulfate  220 mg Oral Daily   Continuous Infusions: . sodium chloride    . heparin 800 Units/hr (07/28/19 1031)  .  sodium bicarbonate (isotonic) infusion in sterile water 75 mL/hr at 07/28/19 1026   PRN Meds: sodium chloride, acetaminophen **OR** acetaminophen, albuterol, chlorpheniramine-HYDROcodone, guaiFENesin-dextromethorphan, hydrALAZINE, ondansetron **OR** ondansetron (ZOFRAN) IV, polyethylene glycol, sodium chloride flush, traZODone   Vital Signs    Vitals:   07/28/19 0730 07/28/19 0900 07/28/19 1000 07/28/19 1200  BP: (!) 131/44 (!) 162/43 (!) 152/41   Pulse: (!) 39 (!) 35 (!) 47   Resp: 18 (!) 21 18 18   Temp: 98.9 F (37.2 C)   98.8 F (37.1 C)  TempSrc: Oral   Oral  SpO2: 99% 98% 98%   Weight:      Height:        Intake/Output Summary (Last 24 hours) at 07/28/2019 1349 Last data filed at 07/28/2019 1200 Gross per 24 hour  Intake 93 ml  Output 0 ml  Net 93 ml   Filed Weights   07/25/19 0500 07/27/19 0500 07/27/19 2146  Weight: 73.5 kg 75.3 kg 76.7 kg    Telemetry    Atrial fib with a controlled and a slow VR - Personally Reviewed  ECG    Atrial fib with a controlled  - Personally Reviewed  Physical Exam  No exam  Labs    Chemistry Recent Labs  Lab 07/24/19 0333 07/25/19 0804 07/26/19 0810  07/28/19 0729 07/28/19 0831  NA 127* 124* 123* 118* 120*  K 4.8 5.1 4.7 5.2*  --   CL 96* 95* 95* 90*  --   CO2 17* 16* 16* 13*  --   GLUCOSE 180* 194* 213* 253*  --   BUN 26* 20 19 39*  --   CREATININE 1.81* 1.42* 1.47* 2.37*  --   CALCIUM 8.5* 8.4* 8.3* 8.2*  --   PROT 6.9 6.7  --  6.6  --   ALBUMIN 3.1* 3.1*  --  3.2*  --   AST 21 31  --  286*  --   ALT 11 14  --  138*  --   ALKPHOS 70 88  --  102  --   BILITOT 0.9 1.2  --  1.8*  --   GFRNONAA 25* 34* 32* 18*  --   GFRAA 29* 39* 38* 21*  --   ANIONGAP 14 13 12 15   --      Hematology Recent Labs  Lab 07/24/19 0333 07/25/19 0804 07/28/19 0729  WBC 8.6 9.6 10.6*  RBC 3.84* 3.84* 3.67*  HGB 10.5* 10.7* 10.3*  HCT 33.2* 34.5* 30.5*  MCV 86.5 89.8 83.1  MCH 27.3 27.9 28.1  MCHC 31.6  31.0 33.8  RDW 13.9 14.2 15.1  PLT 402* 372 233    Cardiac EnzymesNo results for input(s): TROPONINI in the last 168 hours. No results for input(s): TROPIPOC in the last 168 hours.   BNP Recent Labs  Lab 07/24/2019 0559  BNP 259.0*     DDimer  Recent Labs  Lab 07/23/19 0514 07/24/19 0333  DDIMER 0.37 0.41     Radiology    No results found.  Cardiac Studies   none  Patient Profile     86 y.o. female transferred from [facility] to [facility] for heart cath. She was Covid positive on 12/23 but this admit negative. Unclear why she remains on the Covid ward. She has developed worsening renal dysfunction now that her INR is under 2.   Assessment & Plan    1. NSTEMI - her troponins have trended downward. She has not had additional angina. 2. Acute renal failure - her creatinine has gone from 1.4 to 2.4. We will hydrate and avoid nephrotoxins. 3. Atrial fib - her VR is well controlled. She has not had symptomatic bradycardia.  4. Disp. - she was transferred to [facility] for a heart cath. Her renal function will have to improve first. 5. Covid 19 - will check to see what the rules are for not requiring isolation. She was negative at her last COVID  test.     For questions or updates, please contact [facility] Please consult [email or web address] for contact info under Cardiology/STEMI.      Signed, Cristopher Peru, MD  07/28/2019, 1:49 PM  Patient ID: Katrina Arnold, female   DOB: Apr 15, 1933, 86 y.o.   MRN: [id number]

## 2019-07-28 NOTE — Progress Notes (Signed)
Updated the patient's daughter Butch Penny via phone.  All questions answered.

## 2019-07-28 NOTE — Progress Notes (Signed)
NA of 118 reported to Dr. Nevada Crane.   Dr. [place] Crane was previously notified of HR, vitals, nausea, pt "feeling some kind of way" in her chest, troponin, ekg (completed).    Dr. Nevada Crane has been at bedside.  We are waiting on cardiology consult. And phlebotomy for inr.

## 2019-07-28 NOTE — Progress Notes (Addendum)
ANTICOAGULATION CONSULT NOTE - Initial Consult  Pharmacy Consult for heparin  Indication: chest pain/ACS  No Known Allergies  Patient Measurements: Height: 5\' 2"  (157.5 cm) Weight: 169 lb 1.5 oz (76.7 kg) IBW/kg (Calculated) : 50.1 Heparin Dosing Weight: 66.8 kg  Vital Signs: Temp: 98.9 F (37.2 C) (01/10 0730) Temp Source: Oral (01/10 0730) BP: 162/43 (01/10 0900) Pulse Rate: 35 (01/10 0900)  Labs: Recent Labs    07/26/19 0810 07/28/19 0729 07/28/19 0831  HGB  --  10.3*  --   HCT  --  30.5*  --   PLT  --  233  --   LABPROT 28.3*  --  20.0*  INR 2.7*  --  1.7*  CREATININE 1.47* 2.37*  --     Estimated Creatinine Clearance: 16.9 mL/min (A) (by C-G formula based on SCr of 2.37 mg/dL (H)).   Medical History: Past Medical History:  Diagnosis Date  . Atrial fibrillation, chronic ([place])   . Cerebrovascular disease    Left cerebral CVA 2007; residual right sided weakness; 7/09 mild plaque; no focal stenosis  . Chronic anticoagulation   . Chronic kidney disease, stage 3, mod decreased GFR    Creatinine of 1.5 in 10/2008; h/o hyper- and hypokalemia  . Colon adenocarcinoma ([place]) 2009   s/p right hemicolectomy  . Diabetes mellitus, type 2 ([place])   . Dupuytren's contracture   . Hyperlipidemia   . Hypertension     Medications:  Scheduled:  . vitamin C  500 mg Oral Daily  . aspirin EC  81 mg Oral Daily  . feeding supplement  1 Container Oral TID BM  . insulin aspart  0-5 Units Subcutaneous QHS  . insulin aspart  0-9 Units Subcutaneous TID WC  . multivitamin with minerals  1 tablet Oral Daily  . pantoprazole  40 mg Oral Daily  . simvastatin  20 mg Oral QHS  . sodium chloride flush  3 mL Intravenous Q12H  . sodium chloride flush  3 mL Intravenous Q12H  . zinc sulfate  220 mg Oral Daily   Infusions:  . sodium chloride    .  sodium bicarbonate (isotonic) infusion in sterile water      Assessment: 86 y/o Female transferred from [facility] late last night  with suspected ACS. She was taking warfarin PTA for AF. Pharmacy was consulted to dose heparin IV when the patient's INR was <2. Today, INR is 1.7. I assessed risk vs benefit for giving a heparin bolus in this patient, and due to subtherapeutic INR <2 and need for immediate anticoagulation, I elected to give a heparin bolus. To reduce risk of bleeding, I elected to give a lower heparin IV bolus of 2000 units (~1/2 the maximum and calculated dose). Hgb 10.3 (low stable), Plt 233.   Prior to transfer to [employer], the previous pharmacist ordered a STAT INR that never resulted/must have gotten lost in the shuffle when the patient was transferred. When I started my shift this morning ~7 am, I notice that the patient did not have an INR ordered. I ordered a STAT INR and called the phlebotomist on the floor to make them aware of the urgency of this lab. No one answered. I proceeded to call the phlebotomist on another floor to verify that I had the correct phone number, which was confirmed. I called the number at least 4 times throughout the morning to get through, as well as calling the main lab to see if they had an  update--they stated the lab was not collected. I spoke with the RN who stated the phlebotomist collected other labs at 7:15-7:30 am. I informed Dr. Nevada Crane of the situation of the difficulty of obtaining an INR in this patient. I called the lab again at 8:31 am, and the phlebotomist on the phone stated that another phlebotomist was on their way to collect blood for an INR. The order information stated INR in process at 9:24 am. The delay in obtaining an INR delayed the patient from receiving heparin IV earlier this morning since the INR was subtherapeutic at 1.7.   Goal of Therapy:  Heparin level 0.3-0.7 units/ml Monitor platelets by anticoagulation protocol: Yes   Plan:  Give 2000 units bolus x 1 Start heparin infusion at 800 units/hr Check anti-Xa level in 8 hours and daily while on  heparin Continue to monitor H&H and platelets  Monitor for s/sx of bleeding  Agnes Lawrence, PharmD PGY1 Pharmacy Resident

## 2019-07-28 NOTE — Progress Notes (Signed)
The patient's daughter, Raquel James, wishes to be contacted by the doctor in regards to her COVID status as soon as she rounds on this particular patient. Please call at [phone].

## 2019-07-28 NOTE — Progress Notes (Signed)
Progress Note  Patient Name: LARYN WOLVERTON Date of Encounter: 07/28/2019  Primary Cardiologist:   Carlyle Dolly, MD   Subjective   The patient denies chest pain or SOB.   Inpatient Medications    Scheduled Meds: . vitamin C  500 mg Oral Daily  . aspirin EC  81 mg Oral Daily  . feeding supplement  1 Container Oral TID BM  . insulin aspart  0-5 Units Subcutaneous QHS  . insulin aspart  0-9 Units Subcutaneous TID WC  . multivitamin with minerals  1 tablet Oral Daily  . pantoprazole  40 mg Oral Daily  . simvastatin  20 mg Oral QHS  . sodium chloride flush  3 mL Intravenous Q12H  . sodium chloride flush  3 mL Intravenous Q12H  . zinc sulfate  220 mg Oral Daily   Continuous Infusions: . sodium chloride    . heparin 800 Units/hr (07/28/19 1031)  .  sodium bicarbonate (isotonic) infusion in sterile water 75 mL/hr at 07/28/19 1026   PRN Meds: sodium chloride, acetaminophen **OR** acetaminophen, albuterol, chlorpheniramine-HYDROcodone, guaiFENesin-dextromethorphan, hydrALAZINE, ondansetron **OR** ondansetron (ZOFRAN) IV, polyethylene glycol, sodium chloride flush, traZODone   Vital Signs    Vitals:   07/28/19 0452 07/28/19 0730 07/28/19 0900 07/28/19 1000  BP: (!) 144/45 (!) 131/44 (!) 162/43 (!) 152/41  Pulse: (!) 40 (!) 39 (!) 35 (!) 47  Resp: 15 18 (!) 21 18  Temp: 97.8 F (36.6 C) 98.9 F (37.2 C)    TempSrc: Oral Oral    SpO2: 96% 99% 98% 98%  Weight:      Height:        Intake/Output Summary (Last 24 hours) at 07/28/2019 1209 Last data filed at 07/28/2019 0918 Gross per 24 hour  Intake 333 ml  Output 0 ml  Net 333 ml   Filed Weights   07/25/19 0500 07/27/19 0500 07/27/19 2146  Weight: 73.5 kg 75.3 kg 76.7 kg    Telemetry    Atrial fib with slow ventricular rate - Personally Reviewed  ECG    Atrial fib rate 54 - Personally Reviewed  Physical Exam   GEN: No acute distress.   Neck: No  JVD Cardiac: Irregular RR, no murmurs, rubs, or gallops.    Respiratory: Clear  to auscultation bilaterally. GI: Soft, nontender, non-distended  MS: No  edema; No deformity. Neuro:  Nonfocal  Psych: Normal affect   Labs    Chemistry Recent Labs  Lab 07/24/19 0333 07/25/19 0804 07/26/19 0810 07/28/19 0729 07/28/19 0831  NA 127* 124* 123* 118* 120*  K 4.8 5.1 4.7 5.2*  --   CL 96* 95* 95* 90*  --   CO2 17* 16* 16* 13*  --   GLUCOSE 180* 194* 213* 253*  --   BUN 26* 20 19 39*  --   CREATININE 1.81* 1.42* 1.47* 2.37*  --   CALCIUM 8.5* 8.4* 8.3* 8.2*  --   PROT 6.9 6.7  --  6.6  --   ALBUMIN 3.1* 3.1*  --  3.2*  --   AST 21 31  --  286*  --   ALT 11 14  --  138*  --   ALKPHOS 70 88  --  102  --   BILITOT 0.9 1.2  --  1.8*  --   GFRNONAA 25* 34* 32* 18*  --   GFRAA 29* 39* 38* 21*  --   ANIONGAP 14 13 12 15   --      Hematology Recent  Labs  Lab 07/24/19 0333 07/25/19 0804 07/28/19 0729  WBC 8.6 9.6 10.6*  RBC 3.84* 3.84* 3.67*  HGB 10.5* 10.7* 10.3*  HCT 33.2* 34.5* 30.5*  MCV 86.5 89.8 83.1  MCH 27.3 27.9 28.1  MCHC 31.6 31.0 33.8  RDW 13.9 14.2 15.1  PLT 402* 372 233    Cardiac EnzymesNo results for input(s): TROPONINI in the last 168 hours. No results for input(s): TROPIPOC in the last 168 hours.   BNP Recent Labs  Lab 07/26/2019 0559  BNP 259.0*     DDimer  Recent Labs  Lab 07/23/19 0514 07/24/19 0333  DDIMER 0.37 0.41     Radiology    No results found.  Cardiac Studies   ECHO:    1. Left ventricular ejection fraction, by visual estimation, is 60 to 65%. The left ventricle has normal function. There is mildly increased left ventricular hypertrophy. 2. Left ventricular diastolic parameters are indeterminate. 3. The left ventricle demonstrates regional wall motion abnormalities. Apparent inferior basal akinesis. 4. Global right ventricle has normal systolic function.The right ventricular size is normal. No increase in right ventricular wall thickness. 5. Left atrial size was severely dilated. 6.  Right atrial size was moderately dilated. 7. The mitral valve is grossly normal. Moderate mitral valve regurgitation. 8. The tricuspid valve is grossly normal. 9. The aortic valve is tricuspid. Aortic valve regurgitation is mild. 10. The pulmonic valve was grossly normal. Pulmonic valve regurgitation is trivial. 11. Moderately elevated pulmonary artery systolic pressure. 12. The inferior vena cava is normal in size with greater than 50% respiratory variability, suggesting right atrial pressure of 3 mmHg. 13. Evidence of atrial level shunting detected by color flow Doppler. 14. The tricuspid regurgitant velocity is 3.23 m/s, and with an assumed right atrial pressure of 3 mmHg, the estimated right ventricular systolic pressure is moderately elevated at 44.7 mmHg.  Patient Profile     86 y.o. female  w/ PMH of persistent atrial fibrillation (on Coumadin), HTN, HLD and Type 2 DM who presented to [facility] ED on 07/21/2018 for evaluation of chest pain and dyspnea. Positive for COVID on 07/10/2019.  Assessment & Plan    ATRIAL FIB:  Slow ventricular response.  Holding troponin.  INR 1.7  Start heparin. BP is stable and HR will rise with activity.  No change in therapy or indication for pacing at this point.    ELEVATED TROPONIN:   Plan for cath.   INR 1.7.  OK for cath tomorrow.  However, creat is up NA down.  Not a candidate for the cath with acute AKI.    We will follow for the timing of cath.    For questions or updates, please contact [place] Please consult [email or web address] for contact info under Cardiology/STEMI.   Signed, Minus Breeding, MD  07/28/2019, 12:09 PM

## 2019-07-28 NOTE — Progress Notes (Signed)
ANTICOAGULATION CONSULT NOTE   Pharmacy Consult for heparin  Indication: chest pain/ACS  No Known Allergies  Patient Measurements: Height: 5\' 2"  (157.5 cm) Weight: 169 lb 1.5 oz (76.7 kg) IBW/kg (Calculated) : 50.1 Heparin Dosing Weight: 66.8 kg  Vital Signs: Temp: 98.7 F (37.1 C) (01/10 2113) Temp Source: Oral (01/10 2113) BP: 139/54 (01/10 2113) Pulse Rate: 41 (01/10 2113)  Labs: Recent Labs    07/26/19 0810 07/28/19 0729 07/28/19 0831 07/28/19 1749 07/28/19 2135  HGB  --  10.3*  --   --   --   HCT  --  30.5*  --   --   --   PLT  --  233  --   --   --   LABPROT 28.3*  --  20.0*  --   --   INR 2.7*  --  1.7*  --   --   HEPARINUNFRC  --   --   --   --  0.19*  CREATININE 1.47* 2.37*  --  2.37*  --   TROPONINIHS  --   --  153*  --   --     Estimated Creatinine Clearance: 16.9 mL/min (A) (by C-G formula based on SCr of 2.37 mg/dL (H)).   Medical History: Past Medical History:  Diagnosis Date  . Atrial fibrillation, chronic ([place])   . Cerebrovascular disease    Left cerebral CVA 2007; residual right sided weakness; 7/09 mild plaque; no focal stenosis  . Chronic anticoagulation   . Chronic kidney disease, stage 3, mod decreased GFR    Creatinine of 1.5 in 10/2008; h/o hyper- and hypokalemia  . Colon adenocarcinoma ([place]) 2009   s/p right hemicolectomy  . Diabetes mellitus, type 2 ([place])   . Dupuytren's contracture   . Hyperlipidemia   . Hypertension     Medications:  Scheduled:  . vitamin C  500 mg Oral Daily  . aspirin EC  81 mg Oral Daily  . feeding supplement  1 Container Oral TID BM  . insulin aspart  0-5 Units Subcutaneous QHS  . insulin aspart  0-9 Units Subcutaneous TID WC  . multivitamin with minerals  1 tablet Oral Daily  . pantoprazole  40 mg Oral Daily  . simvastatin  20 mg Oral QHS  . sodium chloride flush  3 mL Intravenous Q12H  . sodium chloride flush  3 mL Intravenous Q12H  . zinc sulfate  220 mg Oral Daily   Infusions:  . sodium  chloride 50 mL/hr at 07/28/19 1657  . sodium chloride    . heparin 800 Units/hr (07/28/19 1031)  .  sodium bicarbonate (isotonic) infusion in sterile water 75 mL/hr at 07/28/19 1026    Assessment: 86 y/o Female transferred from [facility] late last night with suspected ACS. She was taking warfarin PTA for AF. Pharmacy was consulted to dose heparin IV when the patient's INR was <2. Today, INR is 1.7. I assessed risk vs benefit for giving a heparin bolus in this patient, and due to subtherapeutic INR <2 and need for immediate anticoagulation, I elected to give a heparin bolus. To reduce risk of bleeding, I elected to give a lower heparin IV bolus of 2000 units (~1/2 the maximum and calculated dose). Hgb 10.3 (low stable), Plt 233.   Heparin level this PM 0.19.  No known issues with IV infusion.  Goal of Therapy:  Heparin level 0.3-0.7 units/ml Monitor platelets by anticoagulation protocol: Yes   Plan:  Increase IV heparin to 950 units/hr.  Recheck heparin level in 8 hrs. Monitor for s/sx of bleeding   Marguerite Olea, [facility] Clinical Pharmacist Phone [phone]  07/28/2019 10:54 PM

## 2019-07-28 NOTE — Progress Notes (Addendum)
Report received from [place], RN at change of shift.  Patient still at [facility], hospital.  Patient arrived on unit, by stretcher, appproximately 2145.  Patient AOX4 and on RA.    2 person skin assessment completed.  Patient placed on telemetry monitor.  Admission vital signs performed.  Patient assessment completed and charged.  Patient c/o nausea, given prn zofran.  Patient MEWS is elevated by bradycardia which is chronic for patient.  No action needed.

## 2019-07-28 NOTE — Progress Notes (Addendum)
PROGRESS NOTE  Katrina Arnold [id number] DOB: 1933-04-12 DOA: 07/29/2019 PCP: Rosita Fire, MD  HPI/Recap of past 61 hours: 86 year old female with a history of chronic atrial fibrillation on warfarin, hypertension, hyperlipidemia, diabetes mellitus type 2 presenting with chest discomfort and shortness of breath.  She had some associated nausea and emesis.  Diagnosed with COVID-19 on 07/10/2019 and her husband is currently admitted to [facility] 2 days prior to this admission.  Found to have atrial fibrillation with slow ventricular response with heart rate as low as 30s.  Cardiology was consulted to assist with management.  Troponins were elevated with a peak of 578.  There was concern for ACS.  Cardiology requested transfer from [facility] hospital to [facility] for possible heart cath.  07/28/19:  Seen and examined.  States funny feeling in her chest. Would not elaborate further.  EKG obtained and troponin elevated but trending down.  Made cardiology aware of her arrival to [facility].  Dr. Lovena Le will see in consultation.  Assessment/Plan: Principal Problem:   Atrial fibrillation (HCC) Active Problems:   Hypertension   CEREBROVASCULAR DISEASE   Chronic anticoagulation   Acute respiratory disease due to COVID-19 virus   Type 2 diabetes mellitus with stage 3 chronic kidney disease (HCC)   Angina pectoris (HCC)   CKD (chronic kidney disease) stage 3, GFR 30-59 ml/min   Bradycardia  Elevated troponin/Angina Pectoris -Concern for ACS -Troponins 578>>> 506>>> 325>> 153 - Made cardiology aware of her arrival to [facility] from [facility].  Dr. Lovena Le will see in consultation. -Heart cath planned 07/29/19.  Worsening Acute on chronic renal failure--CKD stage III -Baseline creatinine 1.1-1.4 -Serum creatinine peaked at 2.37, trending up Started isotonic bicarb drip -Continue holding HCTZ Continue to avoid nephrotoxins Continue to monitor urine output Continue daily  BMPs  Hypovolemic hyponatremia Hyponatremia on presentation sodium level 118 Hypovolemic on exam Start isotonic fluid Repeat serum sodium every 4 hours x 1 day  Acute transaminitis in the setting of COVID-19 viral infection Will obtain acute hep panel Continue to trend with daily CMP's  If trends upward, will obtain imaging  High anion gap metabolic acidosis likely secondary to acute renal failure Worsening renal function Chemistry bicarb 13 with anion gap of 15 Started isotonic bicarb at 75 cc/h x 1 day. Repeat BMP on 1400  Hyperkalemia Potassium 5.2 Started isotonic bicarb which should improve potassium level Repeat BMP at 1400  Persistent atrial fibrillation with slow ventricular response -Continue holding metoprolol -Appreciate cardiology follow-up and recommendations. -Anticipated heart cath on 07/29/2019. -Continue to follow recommendations by cardiology service. Continue to closely monitor on telemetry  COVID-19 infection -Initially tested + 07/10/2019 -Repeat SARS-CoV2 RT-PCR is negative O2 saturation is stable -Continue vitamin C, zinc and supportive care.   Coagulopathy INR less than 2, 1.7 on 07/28/2019 Started on heparin drip.  Due to planned procedure, heart cath on 07/29/2019 Pharmacy managing heparin drip and INR  Hyperlipidemia -Continue statin  Diabetes mellitus type 2 -Continue holding metformin and glipizide -Hemoglobin A1c 7.6 -Continue NovoLog sliding scale  Essential hypertension -Vital signs stable -Calcium channel blockers and beta-blockers discontinued in the setting of bradycardia. -As needed hydralazine has been ordered.   Disposition Plan:    Plan to discharge to home after cardiology signs off.   Family Communication:   No Family at bedside  Consultants:  cardiology  Code Status:  FULL   DVT Prophylaxis:   Heparin drip.   Objective: Vitals:   07/28/19 0452 07/28/19 0730 07/28/19  0900 07/28/19 1000  BP: (!)  144/45 (!) 131/44 (!) 162/43 (!) 152/41  Pulse: (!) 40 (!) 39 (!) 35 (!) 47  Resp: 15 18 (!) 21 18  Temp: 97.8 F (36.6 C) 98.9 F (37.2 C)    TempSrc: Oral Oral    SpO2: 96% 99% 98% 98%  Weight:      Height:        Intake/Output Summary (Last 24 hours) at 07/28/2019 1219 Last data filed at 07/28/2019 [id number] Gross per 24 hour  Intake 333 ml  Output 0 ml  Net 333 ml   Filed Weights   07/25/19 0500 07/27/19 0500 07/27/19 2146  Weight: 73.5 kg 75.3 kg 76.7 kg    Exam:  . General: 86 y.o. year-old female well developed well nourished in no acute distress.  Alert and oriented x3. . Cardiovascular: Regular rate and rhythm with no rubs or gallops.  No thyromegaly or JVD noted.   Marland Kitchen Respiratory: Clear to auscultation with no wheezes or rales. Good inspiratory effort. . Abdomen: Soft nontender nondistended with normal bowel sounds x4 quadrants. . Musculoskeletal: No lower extremity edema. 2/4 pulses in all 4 extremities. Marland Kitchen Psychiatry: Mood is appropriate for condition and setting   Data Reviewed: CBC: Recent Labs  Lab 08/18/2019 0559 07/23/19 0514 07/24/19 0333 07/25/19 0804 07/28/19 0729  WBC 8.1 7.9 8.6 9.6 10.6*  NEUTROABS 5.7 4.6 5.3 7.4 7.0  HGB 12.2 12.1 10.5* 10.7* 10.3*  HCT 37.4 38.1 33.2* 34.5* 30.5*  MCV 86.4 87.0 86.5 89.8 83.1  PLT 364 394 402* 372 0000000   Basic Metabolic Panel: Recent Labs  Lab 07/23/19 0514 07/24/19 0333 07/25/19 0804 07/26/19 0810 07/28/19 0729 07/28/19 0831  NA 131* 127* 124* 123* 118* 120*  K 5.0 4.8 5.1 4.7 5.2*  --   CL 98 96* 95* 95* 90*  --   CO2 18* 17* 16* 16* 13*  --   GLUCOSE 165* 180* 194* 213* 253*  --   BUN 24* 26* 20 19 39*  --   CREATININE 1.81* 1.81* 1.42* 1.47* 2.37*  --   CALCIUM 9.0 8.5* 8.4* 8.3* 8.2*  --   MG 1.9 1.8 1.8  --  2.1  --   PHOS 3.4 3.1  --   --  3.4  --    GFR: Estimated Creatinine Clearance: 16.9 mL/min (A) (by C-G formula based on SCr of 2.37 mg/dL (H)). Liver Function Tests: Recent Labs  Lab  08/05/2019 0559 07/23/19 0514 07/24/19 0333 07/25/19 0804 07/28/19 0729  AST 28 21 21 31  286*  ALT 13 11 11 14  138*  ALKPHOS 83 77 70 88 102  BILITOT 0.6 0.8 0.9 1.2 1.8*  PROT 7.8 7.5 6.9 6.7 6.6  ALBUMIN 3.4* 3.3* 3.1* 3.1* 3.2*   No results for input(s): LIPASE, AMYLASE in the last 168 hours. No results for input(s): AMMONIA in the last 168 hours. Coagulation Profile: Recent Labs  Lab 07/23/19 0900 07/24/19 0333 07/25/19 0804 07/26/19 0810 07/28/19 0831  INR 3.5* 3.9* 2.7* 2.7* 1.7*   Cardiac Enzymes: No results for input(s): CKTOTAL, CKMB, CKMBINDEX, TROPONINI in the last 168 hours. BNP (last 3 results) No results for input(s): PROBNP in the last 8760 hours. HbA1C: No results for input(s): HGBA1C in the last 72 hours. CBG: Recent Labs  Lab 07/27/19 1141 07/27/19 1626 07/27/19 2150 07/28/19 0746 07/28/19 1143  GLUCAP 194* 197* 213* 262* 218*   Lipid Profile: No results for input(s): CHOL, HDL, LDLCALC, TRIG, CHOLHDL, LDLDIRECT in the last  72 hours. Thyroid Function Tests: No results for input(s): TSH, T4TOTAL, FREET4, T3FREE, THYROIDAB in the last 72 hours. Anemia Panel: No results for input(s): VITAMINB12, FOLATE, FERRITIN, TIBC, IRON, RETICCTPCT in the last 72 hours. Urine analysis:    Component Value Date/Time   COLORURINE YELLOW 04/23/2017 0049   APPEARANCEUR HAZY (A) 04/23/2017 0049   LABSPEC 1.021 04/23/2017 0049   PHURINE 5.0 04/23/2017 0049   GLUCOSEU NEGATIVE 04/23/2017 0049   GLUCOSEU NEG mg/dL 07/06/2009 1903   HGBUR MODERATE (A) 04/23/2017 0049   BILIRUBINUR NEGATIVE 04/23/2017 0049   KETONESUR NEGATIVE 04/23/2017 0049   PROTEINUR 100 (A) 04/23/2017 0049   UROBILINOGEN 1 07/06/2009 1903   NITRITE NEGATIVE 04/23/2017 0049   LEUKOCYTESUR NEGATIVE 04/23/2017 0049   Sepsis Labs: @LABRCNTIP (procalcitonin:4,lacticidven:4)  ) Recent Results (from the past 240 hour(s))  Respiratory Panel by RT PCR (Flu A&B, Covid) - Nasopharyngeal Swab      Status: None   Collection Time: 07/23/2019  8:42 AM   Specimen: Nasopharyngeal Swab  Result Value Ref Range Status   SARS Coronavirus 2 by RT PCR NEGATIVE NEGATIVE Final    Comment: (NOTE) SARS-CoV-2 target nucleic acids are NOT DETECTED. The SARS-CoV-2 RNA is generally detectable in upper respiratoy specimens during the acute phase of infection. The lowest concentration of SARS-CoV-2 viral copies this assay can detect is 131 copies/mL. A negative result does not preclude SARS-Cov-2 infection and should not be used as the sole basis for treatment or other patient management decisions. A negative result may occur with  improper specimen collection/handling, submission of specimen other than nasopharyngeal swab, presence of viral mutation(s) within the areas targeted by this assay, and inadequate number of viral copies (<131 copies/mL). A negative result must be combined with clinical observations, patient history, and epidemiological information. The expected result is Negative. Fact Sheet for Patients:  PinkCheek.be Fact Sheet for Healthcare Providers:  GravelBags.it This test is not yet ap proved or cleared by the [place] FDA and  has been authorized for detection and/or diagnosis of SARS-CoV-2 by FDA under an Emergency Use Authorization (EUA). This EUA will remain  in effect (meaning this test can be used) for the duration of the COVID-19 declaration under Section 564(b)(1) of the Act, 21 U.S.C. section 360bbb-3(b)(1), unless the authorization is terminated or revoked sooner.    Influenza A by PCR NEGATIVE NEGATIVE Final   Influenza B by PCR NEGATIVE NEGATIVE Final    Comment: (NOTE) The Xpert Xpress SARS-CoV-2/FLU/RSV assay is intended as an aid in  the diagnosis of influenza from Nasopharyngeal swab specimens and  should not be used as a sole basis for treatment. Nasal washings and  aspirates are unacceptable for  Xpert Xpress SARS-CoV-2/FLU/RSV  testing. Fact Sheet for Patients: PinkCheek.be Fact Sheet for Healthcare Providers: GravelBags.it This test is not yet approved or cleared by the [place] FDA and  has been authorized for detection and/or diagnosis of SARS-CoV-2 by  FDA under an Emergency Use Authorization (EUA). This EUA will remain  in effect (meaning this test can be used) for the duration of the  Covid-19 declaration under Section 564(b)(1) of the Act, 21  U.S.C. section 360bbb-3(b)(1), unless the authorization is  terminated or revoked. Performed at [facility], [address] [place], [place] [address]       Studies: No results found.  Scheduled Meds: . vitamin C  500 mg Oral Daily  . aspirin EC  81 mg Oral Daily  . feeding supplement  1 Container Oral TID BM  .  insulin aspart  0-5 Units Subcutaneous QHS  . insulin aspart  0-9 Units Subcutaneous TID WC  . multivitamin with minerals  1 tablet Oral Daily  . pantoprazole  40 mg Oral Daily  . simvastatin  20 mg Oral QHS  . sodium chloride flush  3 mL Intravenous Q12H  . sodium chloride flush  3 mL Intravenous Q12H  . zinc sulfate  220 mg Oral Daily    Continuous Infusions: . sodium chloride    . heparin 800 Units/hr (07/28/19 1031)  .  sodium bicarbonate (isotonic) infusion in sterile water 75 mL/hr at 07/28/19 1026     LOS: 6 days     Kayleen Memos, MD [facility] Pager [phone]  If 7PM-7AM, please contact night-coverage [email or web address] Password [facility] 07/28/2019, 12:19 PM

## 2019-07-28 NOTE — Progress Notes (Signed)
Trop 153  NA 120  Reported to Dr. Nevada Crane.

## 2019-07-29 ENCOUNTER — Inpatient Hospital Stay (HOSPITAL_COMMUNITY): Payer: Medicare HMO

## 2019-07-29 DIAGNOSIS — R0602 Shortness of breath: Secondary | ICD-10-CM | POA: Diagnosis not present

## 2019-07-29 DIAGNOSIS — D631 Anemia in chronic kidney disease: Secondary | ICD-10-CM | POA: Diagnosis not present

## 2019-07-29 DIAGNOSIS — E871 Hypo-osmolality and hyponatremia: Secondary | ICD-10-CM | POA: Diagnosis not present

## 2019-07-29 DIAGNOSIS — N183 Chronic kidney disease, stage 3 unspecified: Secondary | ICD-10-CM | POA: Diagnosis not present

## 2019-07-29 DIAGNOSIS — N179 Acute kidney failure, unspecified: Secondary | ICD-10-CM | POA: Diagnosis not present

## 2019-07-29 DIAGNOSIS — I4811 Longstanding persistent atrial fibrillation: Secondary | ICD-10-CM | POA: Diagnosis not present

## 2019-07-29 DIAGNOSIS — Z7901 Long term (current) use of anticoagulants: Secondary | ICD-10-CM

## 2019-07-29 DIAGNOSIS — I209 Angina pectoris, unspecified: Secondary | ICD-10-CM | POA: Diagnosis not present

## 2019-07-29 DIAGNOSIS — R001 Bradycardia, unspecified: Secondary | ICD-10-CM | POA: Diagnosis not present

## 2019-07-29 DIAGNOSIS — I129 Hypertensive chronic kidney disease with stage 1 through stage 4 chronic kidney disease, or unspecified chronic kidney disease: Secondary | ICD-10-CM | POA: Diagnosis not present

## 2019-07-29 LAB — COMPREHENSIVE METABOLIC PANEL
ALT: 192 U/L — ABNORMAL HIGH (ref 0–44)
AST: 322 U/L — ABNORMAL HIGH (ref 15–41)
Albumin: 2.9 g/dL — ABNORMAL LOW (ref 3.5–5.0)
Alkaline Phosphatase: 94 U/L (ref 38–126)
Anion gap: 21 — ABNORMAL HIGH (ref 5–15)
BUN: 45 mg/dL — ABNORMAL HIGH (ref 8–23)
CO2: 14 mmol/L — ABNORMAL LOW (ref 22–32)
Calcium: 8.1 mg/dL — ABNORMAL LOW (ref 8.9–10.3)
Chloride: 87 mmol/L — ABNORMAL LOW (ref 98–111)
Creatinine, Ser: 2.44 mg/dL — ABNORMAL HIGH (ref 0.44–1.00)
GFR calc Af Amer: 20 mL/min — ABNORMAL LOW (ref >60)
GFR calc non Af Amer: 18 mL/min — ABNORMAL LOW (ref >60)
Glucose, Bld: 205 mg/dL — ABNORMAL HIGH (ref 70–99)
Potassium: 4.7 mmol/L (ref 3.5–5.1)
Sodium: 122 mmol/L — ABNORMAL LOW (ref 135–145)
Total Bilirubin: 1.8 mg/dL — ABNORMAL HIGH (ref 0.3–1.2)
Total Protein: 6.2 g/dL — ABNORMAL LOW (ref 6.5–8.1)

## 2019-07-29 LAB — BASIC METABOLIC PANEL
Anion gap: 21 — ABNORMAL HIGH (ref 5–15)
BUN: 47 mg/dL — ABNORMAL HIGH (ref 8–23)
CO2: 14 mmol/L — ABNORMAL LOW (ref 22–32)
Calcium: 7.9 mg/dL — ABNORMAL LOW (ref 8.9–10.3)
Chloride: 85 mmol/L — ABNORMAL LOW (ref 98–111)
Creatinine, Ser: 2.62 mg/dL — ABNORMAL HIGH (ref 0.44–1.00)
GFR calc Af Amer: 19 mL/min — ABNORMAL LOW (ref >60)
GFR calc non Af Amer: 16 mL/min — ABNORMAL LOW (ref >60)
Glucose, Bld: 199 mg/dL — ABNORMAL HIGH (ref 70–99)
Potassium: 4.5 mmol/L (ref 3.5–5.1)
Sodium: 120 mmol/L — ABNORMAL LOW (ref 135–145)

## 2019-07-29 LAB — GLUCOSE, CAPILLARY
Glucose-Capillary: 185 mg/dL — ABNORMAL HIGH (ref 70–99)
Glucose-Capillary: 200 mg/dL — ABNORMAL HIGH (ref 70–99)
Glucose-Capillary: 182 mg/dL — ABNORMAL HIGH (ref 70–99)
Glucose-Capillary: 161 mg/dL — ABNORMAL HIGH (ref 70–99)
Glucose-Capillary: 145 mg/dL — ABNORMAL HIGH (ref 70–99)

## 2019-07-29 LAB — HEPATITIS PANEL, ACUTE
HCV Ab: NONREACTIVE
HCV Ab: NONREACTIVE
Hep A IgM: NONREACTIVE
Hep A IgM: NONREACTIVE
Hep B C IgM: NONREACTIVE
Hep B C IgM: NONREACTIVE
Hepatitis B Surface Ag: NONREACTIVE
Hepatitis B Surface Ag: NONREACTIVE

## 2019-07-29 LAB — CBC
HCT: 29 % — ABNORMAL LOW (ref 36.0–46.0)
Hemoglobin: 9.6 g/dL — ABNORMAL LOW (ref 12.0–15.0)
MCH: 28.2 pg (ref 26.0–34.0)
MCHC: 33.1 g/dL (ref 30.0–36.0)
MCV: 85.3 fL (ref 80.0–100.0)
Platelets: 222 10*3/uL (ref 150–400)
RBC: 3.4 MIL/uL — ABNORMAL LOW (ref 3.87–5.11)
RDW: 15.5 % (ref 11.5–15.5)
WBC: 13.9 10*3/uL — ABNORMAL HIGH (ref 4.0–10.5)
nRBC: 1.2 % — ABNORMAL HIGH (ref 0.0–0.2)

## 2019-07-29 LAB — SODIUM
Sodium: 121 mmol/L — ABNORMAL LOW (ref 135–145)
Sodium: 122 mmol/L — ABNORMAL LOW (ref 135–145)
Sodium: 121 mmol/L — ABNORMAL LOW (ref 135–145)
Sodium: 122 mmol/L — ABNORMAL LOW (ref 135–145)

## 2019-07-29 LAB — PROTIME-INR
INR: 2.1 — ABNORMAL HIGH (ref 0.8–1.2)
INR: 2 — ABNORMAL HIGH (ref 0.8–1.2)
Prothrombin Time: 23.5 seconds — ABNORMAL HIGH (ref 11.4–15.2)
Prothrombin Time: 22.9 seconds — ABNORMAL HIGH (ref 11.4–15.2)

## 2019-07-29 LAB — HEPARIN LEVEL (UNFRACTIONATED): Heparin Unfractionated: 0.8 IU/mL — ABNORMAL HIGH (ref 0.30–0.70)

## 2019-07-29 MED ORDER — SODIUM CHLORIDE 0.9 % IV SOLN: INTRAVENOUS | Status: DC

## 2019-07-29 MED ORDER — IPRATROPIUM BROMIDE HFA 17 MCG/ACT IN AERS
2.0000 | INHALATION_SPRAY | RESPIRATORY_TRACT | Status: DC | PRN
  Filled 2019-07-29: qty 12.9

## 2019-07-29 MED ORDER — VITAMIN D 25 MCG (1000 UNIT) PO TABS
1000.0000 [IU] | ORAL_TABLET | Freq: Every day | ORAL | Status: DC
  Administered 2019-07-29: 1000 [IU] via ORAL
  Filled 2019-07-29: qty 1

## 2019-07-29 MED ORDER — SODIUM BICARBONATE 650 MG PO TABS
650.0000 mg | ORAL_TABLET | Freq: Two times a day (BID) | ORAL | Status: DC
  Administered 2019-07-29 (×2): 650 mg via ORAL
  Filled 2019-07-29 (×2): qty 1

## 2019-07-29 MED ORDER — ALBUTEROL SULFATE HFA 108 (90 BASE) MCG/ACT IN AERS
2.0000 | INHALATION_SPRAY | Freq: Three times a day (TID) | RESPIRATORY_TRACT | Status: DC
  Administered 2019-07-29 (×2): 2 via RESPIRATORY_TRACT

## 2019-07-29 MED ORDER — LORAZEPAM 2 MG/ML IJ SOLN
0.5000 mg | Freq: Four times a day (QID) | INTRAMUSCULAR | Status: DC | PRN
  Administered 2019-07-29: 0.5 mg via INTRAVENOUS
  Filled 2019-07-29: qty 1

## 2019-07-29 MED ORDER — IPRATROPIUM BROMIDE HFA 17 MCG/ACT IN AERS
2.0000 | INHALATION_SPRAY | Freq: Three times a day (TID) | RESPIRATORY_TRACT | Status: DC
  Administered 2019-07-29 (×2): 2 via RESPIRATORY_TRACT
  Filled 2019-07-29: qty 12.9

## 2019-07-29 NOTE — Progress Notes (Signed)
Progress Note  Patient Name: Katrina Arnold Date of Encounter: 07/29/2019  Primary Cardiologist: Carlyle Dolly, MD   Subjective   No chest pain   Inpatient Medications    Scheduled Meds: . albuterol  2 puff Inhalation Q8H  . vitamin C  500 mg Oral Daily  . aspirin EC  81 mg Oral Daily  . cholecalciferol  1,000 Units Oral Daily  . feeding supplement  1 Container Oral TID BM  . insulin aspart  0-5 Units Subcutaneous QHS  . insulin aspart  0-9 Units Subcutaneous TID WC  . ipratropium  2 puff Inhalation Q8H  . multivitamin with minerals  1 tablet Oral Daily  . pantoprazole  40 mg Oral Daily  . simvastatin  20 mg Oral QHS  . sodium chloride flush  3 mL Intravenous Q12H  . sodium chloride flush  3 mL Intravenous Q12H  . zinc sulfate  220 mg Oral Daily   Continuous Infusions: . sodium chloride    . heparin 950 Units/hr (07/28/19 2253)  .  sodium bicarbonate (isotonic) infusion in sterile water 75 mL/hr at 07/29/19 0033   PRN Meds: sodium chloride, acetaminophen **OR** acetaminophen, albuterol, chlorpheniramine-HYDROcodone, guaiFENesin-dextromethorphan, hydrALAZINE, ondansetron **OR** ondansetron (ZOFRAN) IV, polyethylene glycol, sodium chloride flush, traZODone   Vital Signs    Vitals:   07/28/19 2113 07/29/19 0001 07/29/19 0500 07/29/19 0738  BP: (!) 139/54 (!) 141/55 (!) 124/46   Pulse: (!) 41 (!) 48 (!) 49 (!) 39  Resp: (!) 22 20 (!) 23 15  Temp: 98.7 F (37.1 C) 98.5 F (36.9 C) 98 F (36.7 C)   TempSrc: Oral Oral Oral   SpO2: 100% 100% 98% 100%  Weight:      Height:        Intake/Output Summary (Last 24 hours) at 07/29/2019 0934 Last data filed at 07/29/2019 0600 Gross per 24 hour  Intake 741.35 ml  Output 0 ml  Net 741.35 ml    Last 3 Weights 07/27/2019 07/27/2019 07/25/2019  Weight (lbs) 169 lb 1.5 oz 166 lb 0.1 oz 162 lb 0.6 oz  Weight (kg) 76.7 kg 75.3 kg 73.5 kg      Telemetry    Atrial fibrillation, HR in 30's to 40's overnight with pauses up to  2.62 seconds. Currently in the 60's.  - Personally Reviewed  ECG    Atrial fibrillation with slow ventricular response, HR 54. No acute ST changes. - Personally Reviewed  Physical Exam   Affect appropriate Overweight black female  HEENT: normal Neck supple with no adenopathy JVP normal no bruits no thyromegaly Lungs basilar crackles no wheezing and good diaphragmatic motion Heart:  S1/S2 no murmur, no rub, gallop or click PMI normal Abdomen: benighn, BS positve, no tenderness, no AAA no bruit.  No HSM or HJR Distal pulses intact with no bruits No edema Neuro non-focal Skin warm and dry No muscular weakness   Labs    Chemistry Recent Labs  Lab 07/24/19 0333 07/25/19 0804 07/26/19 0810 07/28/19 0729 07/28/19 0831 07/28/19 1749 07/29/19 0101  NA 127* 124* 123* 118* 120* 119*  121* 121*  K 4.8 5.1 4.7 5.2*  --  5.6*  --   CL 96* 95* 95* 90*  --  90*  --   CO2 17* 16* 16* 13*  --  15*  --   GLUCOSE 180* 194* 213* 253*  --  206*  --   BUN 26* 20 19 39*  --  43*  --   CREATININE 1.81* 1.42*  1.47* 2.37*  --  2.37*  --   CALCIUM 8.5* 8.4* 8.3* 8.2*  --  8.0*  --   PROT 6.9 6.7  --  6.6  --   --   --   ALBUMIN 3.1* 3.1*  --  3.2*  --   --   --   AST 21 31  --  286*  --   --   --   ALT 11 14  --  138*  --   --   --   ALKPHOS 70 88  --  102  --   --   --   BILITOT 0.9 1.2  --  1.8*  --   --   --   GFRNONAA 25* 34* 32* 18*  --  18*  --   GFRAA 29* 39* 38* 21*  --  21*  --   ANIONGAP 14 13 12 15   --  14  --      Hematology Recent Labs  Lab 07/25/19 0804 07/28/19 0729 07/29/19 0501  WBC 9.6 10.6* 13.9*  RBC 3.84* 3.67* 3.40*  HGB 10.7* 10.3* 9.6*  HCT 34.5* 30.5* 29.0*  MCV 89.8 83.1 85.3  MCH 27.9 28.1 28.2  MCHC 31.0 33.8 33.1  RDW 14.2 15.1 15.5  PLT 372 233 222    Cardiac EnzymesNo results for input(s): TROPONINI in the last 168 hours. No results for input(s): TROPIPOC in the last 168 hours.   BNP No results for input(s): BNP, PROBNP in the last 168  hours.   DDimer  Recent Labs  Lab 07/23/19 0514 07/24/19 0333  DDIMER 0.37 0.41     Radiology    No results found.  Cardiac Studies   Echocardiogram: 07/28/2019 IMPRESSIONS    1. Left ventricular ejection fraction, by visual estimation, is 60 to 65%. The left ventricle has normal function. There is mildly increased left ventricular hypertrophy.  2. Left ventricular diastolic parameters are indeterminate.  3. The left ventricle demonstrates regional wall motion abnormalities. Apparent inferior basal akinesis.  4. Global right ventricle has normal systolic function.The right ventricular size is normal. No increase in right ventricular wall thickness.  5. Left atrial size was severely dilated.  6. Right atrial size was moderately dilated.  7. The mitral valve is grossly normal. Moderate mitral valve regurgitation.  8. The tricuspid valve is grossly normal.  9. The aortic valve is tricuspid. Aortic valve regurgitation is mild. 10. The pulmonic valve was grossly normal. Pulmonic valve regurgitation is trivial. 11. Moderately elevated pulmonary artery systolic pressure. 12. The inferior vena cava is normal in size with greater than 50% respiratory variability, suggesting right atrial pressure of 3 mmHg. 13. Evidence of atrial level shunting detected by color flow Doppler. 14. The tricuspid regurgitant velocity is 3.23 m/s, and with an assumed right atrial pressure of 3 mmHg, the estimated right ventricular systolic pressure is moderately elevated at 44.7 mmHg.  Patient Profile     86 y.o. female w/ PMH of persistent atrial fibrillation (on Coumadin), HTN, HLD and Type 2 DM who presented to [facility] ED on 07/21/2018 for evaluation of chest pain and dyspnea. Positive for COVID on 07/10/2019.  Assessment & Plan    1. Chest Pain/ Dyspnea on Exertion-  No acute ECG changes EF 60-65% by echo  per Dr Domenic Polite symptoms concerning for ACS Cath has been delayed initially due to elevated INR  and now due to elevated CR Labs pending this am Consider proceeding with cath if Cr under 2.0  2. Recent COVID Diagnosis  tested positive for 12/23 and negative on re-test this admission. Husband admitted to [facility]. Verified with the cath lab she would be able to have her procedure this admission.   3. Persistent Atrial Fibrillation rate control fine off lopressor 2.6 sec pause previously coumadin held for cath continue heparin   4. HTN  Well controlled.  Continue current medications and low sodium Dash type diet.    5. AKI- Cr 2.37 yesterday pending this am with low sodium getting saline    For questions or updates, please contact [place] Please consult [email or web address] for contact info under Cardiology/STEMI.   Rozann Lesches , PA-C 9:34 AM 07/29/2019 Pager: [phone]

## 2019-07-29 NOTE — Progress Notes (Signed)
Spoke w/ Pt's daughter via phone, gave update on Pt status and all questions/concerns were addressed.

## 2019-07-29 NOTE — Progress Notes (Signed)
Inpatient Diabetes Program Recommendations  AACE/ADA: New Consensus Statement on Inpatient Glycemic Control   Target Ranges:  Prepandial:   less than 140 mg/dL      Peak postprandial:   less than 180 mg/dL (1-2 hours)      Critically ill patients:  140 - 180 mg/dL   Results for Katrina Arnold, Katrina Arnold (MRN [id number]) as of 07/29/2019 13:31  Ref. Range 07/28/2019 07:46 07/28/2019 11:43 07/28/2019 15:50 07/28/2019 21:12 07/29/2019 08:04 07/29/2019 12:35  Glucose-Capillary Latest Ref Range: 70 - 99 mg/dL 262 (H) 218 (H) 211 (H) 187 (H) 200 (H) 182 (H)   Review of Glycemic Control  Diabetes history: DM2 Outpatient Diabetes medications: Glipizide XL 2.5 mg daily, Metformin 500 mg BID Current orders for Inpatient glycemic control: Novolog 0-9 units TID with meals, Novolog 0-5 units QHS  Inpatient Diabetes Program Recommendations:   Insulin - Basal: If glucose continues to be consistently greater than 180 mg/dl, may want to consider ordering Levemir 3 units Q24H.  Thanks, Barnie Alderman, RN, MSN, CDE Diabetes Coordinator Inpatient Diabetes Program [phone] (Team Pager from 8am to 5pm)

## 2019-07-29 NOTE — Progress Notes (Signed)
Cr is 2.44 today  Cath cancelled Have written for saline infusion and bicarbonate tablets Resume diet NPO in am Cath tomorrow or latter in week when Cr <2.0  Continue heparin  Jenkins Rouge

## 2019-07-29 NOTE — Consult Note (Signed)
[name] Renal Consultation Note  Requesting MD: Nevada Crane Indication for Consultation: A on CRF  HPI:  Katrina Arnold is a 86 y.o. female with past medical history significant for hypertension, diabetes mellitus, hyperlipidemia is well as atrial fibrillation on Coumadin.  She maybe has some mild CKD at baseline creatinine 1.2-1.5.  She was diagnosed with Covid on 07/10/2019.  She presented to the hospital on 1/4 with chest pain, shortness of breath, nausea.  She was found to be bradycardic and troponins were elevated.  Her presenting creatinine was 1.66, worsened to 1.8 and then improved to the one-point fours on 1/7 and 1/8.  Then, when labs were checked on 1/10 creatinine was 2.37-then 2.62 today and that is the reason for consult.  Urine output has not been well recorded.  There have been no low blood pressures recorded.  Heart rate has stayed on the low side-she has not received a beta-blocker here.  Cardiology was wanting to do a heart catheterization but that has been canceled due to her acute kidney injury.  Also of note, her sodium decreased to 119 on 1/10.  It has trended up slowly after being given IV fluids is 122 at last check.  She is not c/o anything specific when I see her   Creat  Date/Time Value Ref Range Status  09/12/2011 09:55 AM 1.13 (H) 0.50 - 1.10 mg/dL Final   Creatinine, Ser  Date/Time Value Ref Range Status  07/29/2019 10:40 AM 2.62 (H) 0.44 - 1.00 mg/dL Final  07/29/2019 05:01 AM 2.44 (H) 0.44 - 1.00 mg/dL Final  07/28/2019 05:49 PM 2.37 (H) 0.44 - 1.00 mg/dL Final  07/28/2019 07:29 AM 2.37 (H) 0.44 - 1.00 mg/dL Final  07/26/2019 08:10 AM 1.47 (H) 0.44 - 1.00 mg/dL Final  07/25/2019 08:04 AM 1.42 (H) 0.44 - 1.00 mg/dL Final  07/24/2019 03:33 AM 1.81 (H) 0.44 - 1.00 mg/dL Final  07/23/2019 05:14 AM 1.81 (H) 0.44 - 1.00 mg/dL Final  07/23/2019 05:59 AM 1.66 (H) 0.44 - 1.00 mg/dL Final  12/12/2017 10:11 PM 1.26 (H) 0.44 - 1.00 mg/dL Final  04/22/2017 11:46  PM 1.43 (H) 0.44 - 1.00 mg/dL Final  04/06/2016 11:58 AM 1.10 (H) 0.44 - 1.00 mg/dL Final  04/03/2015 09:51 AM 1.03 (H) 0.44 - 1.00 mg/dL Final  04/04/2014 11:34 AM 0.95 0.50 - 1.10 mg/dL Final  04/05/2011 09:15 AM 0.95 0.50 - 1.10 mg/dL Final  08/13/2009 08:39 PM 1.34 (H) 0.40 - 1.20 mg/dL Final  08/13/2009 12:00 AM 1.34 mg/dL   07/06/2009 07:03 PM 1.09 0.40 - 1.20 mg/dL Final  01/16/2008 04:15 AM 0.90  Final  01/15/2008 04:30 AM 0.85  Final  01/14/2008 04:06 AM 0.88  Final  01/13/2008 04:22 AM 0.95  Final  01/12/2008 04:30 AM 1.03  Final  01/11/2008 05:52 AM 1.12  Final  01/07/2008 12:47 PM 1.09  Final     PMHx:   Past Medical History:  Diagnosis Date  . Atrial fibrillation, chronic ([place])   . Cerebrovascular disease    Left cerebral CVA 2007; residual right sided weakness; 7/09 mild plaque; no focal stenosis  . Chronic anticoagulation   . Chronic kidney disease, stage 3, mod decreased GFR    Creatinine of 1.5 in 10/2008; h/o hyper- and hypokalemia  . Colon adenocarcinoma ([place]) 2009   s/p right hemicolectomy  . Diabetes mellitus, type 2 ([place])   . Dupuytren's contracture   . Hyperlipidemia   . Hypertension     Past Surgical History:  Procedure Laterality Date  .  APPENDECTOMY    . COLONOSCOPY  6/23/200   Rourk: Left diverticula, multiple polyps, adenocarcinoma of colon 8 cm distal to ICV  . COLONOSCOPY  07/02/2012   Procedure: COLONOSCOPY;  Surgeon: Daneil Dolin, MD;  Location: [facility];  Service: Endoscopy;  Laterality: N/A;  9:30  . COLONOSCOPY W/ POLYPECTOMY  05/2009   Rourk: internal hemorrhoidal tag, site of piecemeal polypectomy in  August identified, residual polypectomy.  Multiple tubular adenomas/tubulovillous  . HEMICOLECTOMY  12/2007   Right, adenocarcinoma  . TUBAL LIGATION      Family Hx:  Family History  Problem Relation Age of Onset  . Diabetes Mother   . Heart attack Father   . Lung cancer Son   . CAD Sister   . Cancer Brother   . CAD  Sister   . COPD Brother   . Diabetes Brother     Social History:  reports that she has never smoked. Her smokeless tobacco use includes snuff. She reports that she does not drink alcohol or use drugs.  Allergies: No Known Allergies  Medications: Prior to Admission medications   Medication Sig Start Date End Date Taking? Authorizing Provider  amLODipine (NORVASC) 5 MG tablet Take 1 tablet (5 mg total) by mouth daily. 09/25/13  Yes BranchAlphonse Guild, MD  glipiZIDE (GLUCOTROL XL) 2.5 MG 24 hr tablet Take 2.5 mg by mouth daily.     Yes [provider]  hydrochlorothiazide (MICROZIDE) 12.5 MG capsule Take 12.5 mg by mouth daily.   Yes [provider]  metFORMIN (GLUCOPHAGE) 500 MG tablet Take 500 mg by mouth 2 (two) times daily with a meal.     Yes [provider]  metoprolol (LOPRESSOR) 50 MG tablet Take 50 mg by mouth 2 (two) times daily.     Yes [provider]  omeprazole (PRILOSEC) 20 MG capsule Take 20 mg by mouth daily.     Yes [provider]  potassium chloride SA (K-DUR,KLOR-CON) 20 MEQ tablet TAKE 1 TABLET BY MOUTH EVERY DAY Patient taking differently: Take 20 mEq by mouth daily.  08/18/11  Yes Wall, Marijo Conception, MD  simvastatin (ZOCOR) 20 MG tablet Take 20 mg by mouth at bedtime.    Yes [provider]  warfarin (COUMADIN) 5 MG tablet TAKE 1 TABLET BY MOUTH DAILY EXCEPT TAKE 1/2 TABLET ON MONDAYS, [place] Patient taking differently: Take 5 mg by mouth See admin instructions. TAKE 2.5 MG ON MONDAYS, WEDNESDAYS AND FRIDAYS. TAKE 5 MG ON OTHER DAYS 03/26/19  Yes Branch, Alphonse Guild, MD    I have reviewed the patient's current medications.  Labs:  Results for orders placed or performed during the hospital encounter of 07/27/2019 (from the past 48 hour(s))  Glucose, capillary     Status: Abnormal   Collection Time: 07/27/19  9:50 PM  Result Value Ref Range   Glucose-Capillary 213 (H) 70 - 99 mg/dL  Comprehensive metabolic  panel     Status: Abnormal   Collection Time: 07/28/19  7:29 AM  Result Value Ref Range   Sodium 118 (LL) 135 - 145 mmol/L    Comment: CRITICAL RESULT CALLED TO, READ BACK BY AND VERIFIED WITH: [facility] RN @ [id number] 07/28/2019 BY C.EDENS    Potassium 5.2 (H) 3.5 - 5.1 mmol/L   Chloride 90 (L) 98 - 111 mmol/L   CO2 13 (L) 22 - 32 mmol/L   Glucose, Bld 253 (H) 70 - 99 mg/dL   BUN 39 (H) 8 - 23 mg/dL  Creatinine, Ser 2.37 (H) 0.44 - 1.00 mg/dL   Calcium 8.2 (L) 8.9 - 10.3 mg/dL   Total Protein 6.6 6.5 - 8.1 g/dL   Albumin 3.2 (L) 3.5 - 5.0 g/dL   AST 286 (H) 15 - 41 U/L    Comment: RESULTS CONFIRMED BY MANUAL DILUTION   ALT 138 (H) 0 - 44 U/L   Alkaline Phosphatase 102 38 - 126 U/L   Total Bilirubin 1.8 (H) 0.3 - 1.2 mg/dL   GFR calc non Af Amer 18 (L) >60 mL/min   GFR calc Af Amer 21 (L) >60 mL/min   Anion gap 15 5 - 15    Comment: Performed at [facility] [address] [place], [place] [address]  CBC with Differential/Platelet     Status: Abnormal   Collection Time: 07/28/19  7:29 AM  Result Value Ref Range   WBC 10.6 (H) 4.0 - 10.5 K/uL   RBC 3.67 (L) 3.87 - 5.11 MIL/uL   Hemoglobin 10.3 (L) 12.0 - 15.0 g/dL   HCT 30.5 (L) 36.0 - 46.0 %   MCV 83.1 80.0 - 100.0 fL   MCH 28.1 26.0 - 34.0 pg   MCHC 33.8 30.0 - 36.0 g/dL   RDW 15.1 11.5 - 15.5 %   Platelets 233 150 - 400 K/uL   nRBC 1.2 (H) 0.0 - 0.2 %   Neutrophils Relative % 66 %   Neutro Abs 7.0 1.7 - 7.7 K/uL   Lymphocytes Relative 19 %   Lymphs Abs 2.0 0.7 - 4.0 K/uL   Monocytes Relative 12 %   Monocytes Absolute 1.3 (H) 0.1 - 1.0 K/uL   Eosinophils Relative 0 %   Eosinophils Absolute 0.0 0.0 - 0.5 K/uL   Basophils Relative 0 %   Basophils Absolute 0.0 0.0 - 0.1 K/uL   Immature Granulocytes 3 %   Abs Immature Granulocytes 0.27 (H) 0.00 - 0.07 K/uL    Comment: Performed at [facility], [place] [address], [place], [place] [address]  Magnesium     Status: None   Collection Time: 07/28/19  7:29 AM  Result  Value Ref Range   Magnesium 2.1 1.7 - 2.4 mg/dL    Comment: Performed at [facility] [address], [place], [place] [address]  Phosphorus     Status: None   Collection Time: 07/28/19  7:29 AM  Result Value Ref Range   Phosphorus 3.4 2.5 - 4.6 mg/dL    Comment: Performed at [facility] [address] [place], [place] [address]  Glucose, capillary     Status: Abnormal   Collection Time: 07/28/19  7:46 AM  Result Value Ref Range   Glucose-Capillary 262 (H) 70 - 99 mg/dL  Protime-INR     Status: Abnormal   Collection Time: 07/28/19  8:31 AM  Result Value Ref Range   Prothrombin Time 20.0 (H) 11.4 - 15.2 seconds   INR 1.7 (H) 0.8 - 1.2    Comment: (NOTE) INR goal varies based on device and disease states. Performed at [facility], [place] [address] [place],  [address]   Troponin I (High Sensitivity)     Status: Abnormal   Collection Time: 07/28/19  8:31 AM  Result Value Ref Range   Troponin I (High Sensitivity) 153 (HH) <18 ng/L    Comment: CRITICAL RESULT CALLED TO, READ BACK BY AND VERIFIED WITH: L.SMITH RN @ [id number] 07/28/2019 BY C.EDENS (NOTE) Elevated high sensitivity troponin I (hsTnI) values  and significant  changes across serial measurements may suggest ACS but many other  chronic and acute conditions are known to elevate hsTnI results.  Refer to the Links section for chest pain algorithms and additional  guidance. Performed at [facility], [place] [address], [place], [place] [address]   Sodium     Status: Abnormal   Collection Time: 07/28/19  8:31 AM  Result Value Ref Range   Sodium 120 (L) 135 - 145 mmol/L    Comment: Performed at [facility], [place] [address] [place], [place] [address]  Glucose, capillary     Status: Abnormal   Collection Time: 07/28/19 11:43 AM  Result Value Ref Range   Glucose-Capillary 218 (H) 70 - 99 mg/dL  Glucose, capillary     Status: Abnormal   Collection Time: 07/28/19  3:50 PM  Result Value Ref Range    Glucose-Capillary 211 (H) 70 - 99 mg/dL  Sodium     Status: Abnormal   Collection Time: 07/28/19  5:49 PM  Result Value Ref Range   Sodium 121 (L) 135 - 145 mmol/L    Comment: Performed at [facility], [place] [address], [place], [place] [id number]  Basic metabolic panel     Status: Abnormal   Collection Time: 07/28/19  5:49 PM  Result Value Ref Range   Sodium 119 (LL) 135 - 145 mmol/L    Comment: CRITICAL RESULT CALLED TO, READ BACK BY AND VERIFIED WITH: RN Cruz Condon AT 2004 07/28/19 BY L BENFIELD    Potassium 5.6 (H) 3.5 - 5.1 mmol/L    Comment: SLIGHT HEMOLYSIS   Chloride 90 (L) 98 - 111 mmol/L   CO2 15 (L) 22 - 32 mmol/L   Glucose, Bld 206 (H) 70 - 99 mg/dL   BUN 43 (H) 8 - 23 mg/dL   Creatinine, Ser 2.37 (H) 0.44 - 1.00 mg/dL   Calcium 8.0 (L) 8.9 - 10.3 mg/dL   GFR calc non Af Amer 18 (L) >60 mL/min   GFR calc Af Amer 21 (L) >60 mL/min   Anion gap 14 5 - 15    Comment: Performed at [facility], [place] [address], [place], [place] [address]  Glucose, capillary     Status: Abnormal   Collection Time: 07/28/19  9:12 PM  Result Value Ref Range   Glucose-Capillary 187 (H) 70 - 99 mg/dL  Heparin level (unfractionated)     Status: Abnormal   Collection Time: 07/28/19  9:35 PM  Result Value Ref Range   Heparin Unfractionated 0.19 (L) 0.30 - 0.70 IU/mL    Comment: (NOTE) If heparin results are below expected values, and patient dosage has  been confirmed, suggest follow up testing of antithrombin III levels. Performed at [facility], [place] [address] [place],  [address]   Hepatitis panel, acute     Status: None   Collection Time: 07/28/19 10:08 PM  Result Value Ref Range   Hepatitis B Surface Ag NON REACTIVE NON REACTIVE   HCV Ab NON REACTIVE NON REACTIVE    Comment: (NOTE) Nonreactive HCV antibody screen is consistent with no HCV infections,  unless recent infection is suspected or other evidence exists to indicate HCV infection.    Hep A IgM NON  REACTIVE NON REACTIVE   Hep B C IgM NON REACTIVE NON REACTIVE    Comment: Performed at [facility], [place] [address] [place],  [address]  Sodium     Status:  Abnormal   Collection Time: 07/29/19  1:01 AM  Result Value Ref Range   Sodium 121 (L) 135 - 145 mmol/L    Comment: Performed at [facility], [place] [address] [place], [place] [address]  Hepatitis panel, acute     Status: None   Collection Time: 07/29/19  4:40 AM  Result Value Ref Range   Hepatitis B Surface Ag NON REACTIVE NON REACTIVE   HCV Ab NON REACTIVE NON REACTIVE    Comment: (NOTE) Nonreactive HCV antibody screen is consistent with no HCV infections,  unless recent infection is suspected or other evidence exists to indicate HCV infection.    Hep A IgM NON REACTIVE NON REACTIVE   Hep B C IgM NON REACTIVE NON REACTIVE    Comment: Performed at [facility], [place] [address], [place], [place] [address]  CBC     Status: Abnormal   Collection Time: 07/29/19  5:01 AM  Result Value Ref Range   WBC 13.9 (H) 4.0 - 10.5 K/uL   RBC 3.40 (L) 3.87 - 5.11 MIL/uL   Hemoglobin 9.6 (L) 12.0 - 15.0 g/dL   HCT 29.0 (L) 36.0 - 46.0 %   MCV 85.3 80.0 - 100.0 fL   MCH 28.2 26.0 - 34.0 pg   MCHC 33.1 30.0 - 36.0 g/dL   RDW 15.5 11.5 - 15.5 %   Platelets 222 150 - 400 K/uL   nRBC 1.2 (H) 0.0 - 0.2 %    Comment: Performed at [facility], [place] [address] [place], [place] [address]  Comprehensive metabolic panel     Status: Abnormal   Collection Time: 07/29/19  5:01 AM  Result Value Ref Range   Sodium 122 (L) 135 - 145 mmol/L   Potassium 4.7 3.5 - 5.1 mmol/L   Chloride 87 (L) 98 - 111 mmol/L   CO2 14 (L) 22 - 32 mmol/L   Glucose, Bld 205 (H) 70 - 99 mg/dL   BUN 45 (H) 8 - 23 mg/dL   Creatinine, Ser 2.44 (H) 0.44 - 1.00 mg/dL   Calcium 8.1 (L) 8.9 - 10.3 mg/dL   Total Protein 6.2 (L) 6.5 - 8.1 g/dL   Albumin 2.9 (L) 3.5 - 5.0 g/dL   AST 322 (H) 15 - 41 U/L    Comment: RESULTS CONFIRMED BY MANUAL DILUTION    ALT 192 (H) 0 - 44 U/L   Alkaline Phosphatase 94 38 - 126 U/L   Total Bilirubin 1.8 (H) 0.3 - 1.2 mg/dL   GFR calc non Af Amer 18 (L) >60 mL/min   GFR calc Af Amer 20 (L) >60 mL/min   Anion gap 21 (H) 5 - 15    Comment: Performed at [facility], [place] [address] [place], [place] [address]  Glucose, capillary     Status: Abnormal   Collection Time: 07/29/19  8:04 AM  Result Value Ref Range   Glucose-Capillary 200 (H) 70 - 99 mg/dL  Heparin level (unfractionated)     Status: Abnormal   Collection Time: 07/29/19 10:40 AM  Result Value Ref Range   Heparin Unfractionated 0.80 (H) 0.30 - 0.70 IU/mL    Comment: (NOTE) If heparin results are below expected values, and patient dosage has  been confirmed, suggest follow up testing of antithrombin III levels. Performed at [facility], [place] [address], [place],  [address]   Protime-INR     Status: Abnormal   Collection Time: 07/29/19 10:40 AM  Result  Value Ref Range   Prothrombin Time 23.5 (H) 11.4 - 15.2 seconds   INR 2.1 (H) 0.8 - 1.2    Comment: (NOTE) INR goal varies based on device and disease states. Performed at [facility], [place] [address] [place], [place] [id number]   Basic metabolic panel     Status: Abnormal   Collection Time: 07/29/19 10:40 AM  Result Value Ref Range   Sodium 120 (L) 135 - 145 mmol/L   Potassium 4.5 3.5 - 5.1 mmol/L   Chloride 85 (L) 98 - 111 mmol/L   CO2 14 (L) 22 - 32 mmol/L   Glucose, Bld 199 (H) 70 - 99 mg/dL   BUN 47 (H) 8 - 23 mg/dL   Creatinine, Ser 2.62 (H) 0.44 - 1.00 mg/dL   Calcium 7.9 (L) 8.9 - 10.3 mg/dL   GFR calc non Af Amer 16 (L) >60 mL/min   GFR calc Af Amer 19 (L) >60 mL/min   Anion gap 21 (H) 5 - 15    Comment: Performed at [facility] [address], [place], [place] [address]  Glucose, capillary     Status: Abnormal   Collection Time: 07/29/19 12:35 PM  Result Value Ref Range   Glucose-Capillary 182 (H) 70 - 99 mg/dL  Sodium     Status:  Abnormal   Collection Time: 07/29/19  2:58 PM  Result Value Ref Range   Sodium 122 (L) 135 - 145 mmol/L    Comment: Performed at [facility], [place] [address], [place], [place] [address]  Protime-INR     Status: Abnormal   Collection Time: 07/29/19  2:58 PM  Result Value Ref Range   Prothrombin Time 22.9 (H) 11.4 - 15.2 seconds   INR 2.0 (H) 0.8 - 1.2    Comment: (NOTE) INR goal varies based on device and disease states. Performed at [facility], [place] [address], [place], [place] [address]   Glucose, capillary     Status: Abnormal   Collection Time: 07/29/19  4:36 PM  Result Value Ref Range   Glucose-Capillary 161 (H) 70 - 99 mg/dL     ROS:  A comprehensive review of systems was negative.  Physical Exam: Vitals:   07/29/19 0801 07/29/19 0802  BP:    Pulse: (!) 41   Resp: (!) 25 18  Temp:    SpO2: 100%      General:does not look her stated age.  She does not appear to be in distress HEENT: PERRLA, EOMI, mucous membranes moist Neck: no JVD Heart: bradycardic Lungs: mostly clear  Abdomen: soft, non tender  Extremities: really no peripheral edema  Skin: warm and dry Neuro: simple conversation  Assessment/Plan: 86 year old BF with DM.HTN and chronic Afib.  Recent COVID and more recent CP with positive troponin 1.Renal- has some baseline CKD-  crt 1.2-1.5.  Developed A on CRF in the hospital for unclear reasons.  No nephrotoxins ID'd- no low BPs recorded but has been persistently bradycardic.  Will check U/A to look for clues but suspect hemodynamic injury.  Is making reasonable urine by [place].  No indications for dialysis at this time.  Hopefully will turn itself around 2. Hypertension/volume  - does not seem overloaded.  I am OK with low rate IVF  3. Hyponatremia - has worsened through the course of hospitalization.  Appears euvolemic to dry.  Will check urine osm to look for SIADH.  Giving fluid for now will watch 4. Anemia  -  not a major issue right now     Louis Meckel 07/29/2019, 5:05 PM

## 2019-07-29 NOTE — Progress Notes (Signed)
ANTICOAGULATION CONSULT NOTE   Pharmacy Consult for heparin  Indication: chest pain/ACS  No Known Allergies  Patient Measurements: Height: 5\' 2"  (157.5 cm) Weight: 169 lb 1.5 oz (76.7 kg) IBW/kg (Calculated) : 50.1 Heparin Dosing Weight: 66.8 kg  Vital Signs: Temp: 98 F (36.7 C) (01/11 0500) Temp Source: Oral (01/11 0500) BP: 124/46 (01/11 0500) Pulse Rate: 41 (01/11 0801)  Labs: Recent Labs    07/28/19 0729 07/28/19 0831 07/28/19 1749 07/28/19 2135 07/29/19 0501 07/29/19 1040  HGB 10.3*  --   --   --  9.6*  --   HCT 30.5*  --   --   --  29.0*  --   PLT 233  --   --   --  222  --   LABPROT  --  20.0*  --   --   --  23.5*  INR  --  1.7*  --   --   --  2.1*  HEPARINUNFRC  --   --   --  0.19*  --  0.80*  CREATININE 2.37*  --  2.37*  --  2.44* 2.62*  TROPONINIHS  --  153*  --   --   --   --     Estimated Creatinine Clearance: 15.3 mL/min (A) (by C-G formula based on SCr of 2.62 mg/dL (H)).   Medical History: Past Medical History:  Diagnosis Date  . Atrial fibrillation, chronic ([place])   . Cerebrovascular disease    Left cerebral CVA 2007; residual right sided weakness; 7/09 mild plaque; no focal stenosis  . Chronic anticoagulation   . Chronic kidney disease, stage 3, mod decreased GFR    Creatinine of 1.5 in 10/2008; h/o hyper- and hypokalemia  . Colon adenocarcinoma ([place]) 2009   s/p right hemicolectomy  . Diabetes mellitus, type 2 ([place])   . Dupuytren's contracture   . Hyperlipidemia   . Hypertension     Medications:  Scheduled:  . albuterol  2 puff Inhalation Q8H  . vitamin C  500 mg Oral Daily  . aspirin EC  81 mg Oral Daily  . cholecalciferol  1,000 Units Oral Daily  . feeding supplement  1 Container Oral TID BM  . insulin aspart  0-5 Units Subcutaneous QHS  . insulin aspart  0-9 Units Subcutaneous TID WC  . ipratropium  2 puff Inhalation Q8H  . multivitamin with minerals  1 tablet Oral Daily  . pantoprazole  40 mg Oral Daily  . simvastatin  20 mg  Oral QHS  . sodium bicarbonate  650 mg Oral BID  . sodium chloride flush  3 mL Intravenous Q12H  . sodium chloride flush  3 mL Intravenous Q12H  . zinc sulfate  220 mg Oral Daily   Infusions:  . sodium chloride    . sodium chloride 100 mL/hr at 07/29/19 1150  .  sodium bicarbonate (isotonic) infusion in sterile water 75 mL/hr at 07/29/19 0033    Assessment: 86 y/o Female transferred from [facility] with suspected ACS. She was taking warfarin PTA for AF. Pharmacy was consulted to dose heparin IV when the patient's INR was <2. INR was 1.7 when drip initiated, however INR today is up to 2.1. Timing of last dose of Warfarin is unknown.   I have d/w primary MD and will hold heparin for now and repeat INR at 1400. If INR <2, will reinitiate heparin drip.     Goal of Therapy:  Heparin level 0.3-0.7 units/ml Monitor platelets by anticoagulation protocol: Yes  Plan:  Hold heparin drip for now Repeat INR at 1400 and reinitiate heparin drip once <2. Monitor for s/sx of bleeding   Dariyah Garduno A. Levada Dy, PharmD, BCPS, FNKF Clinical Pharmacist [place] Please utilize Amion for appropriate phone number to reach the unit pharmacist ([place])    07/29/2019 12:31 PM

## 2019-07-29 NOTE — Progress Notes (Signed)
Updated patient's daughter Butch Penny via phone.  All questions answered.

## 2019-07-29 NOTE — Progress Notes (Signed)
PROGRESS NOTE  Katrina Arnold [id number] DOB: [birth year] DOA: 07/19/2019 PCP: Rosita Fire, MD  HPI/Recap of past 40 hours: 86 year old female with a history of chronic atrial fibrillation on warfarin, hypertension, hyperlipidemia, diabetes mellitus type 2 presenting with chest discomfort and shortness of breath.  She had some associated nausea and emesis.  Diagnosed with COVID-19 on 07/10/2019 and her husband is currently admitted to [facility] 2 days prior to this admission.  Found to have atrial fibrillation with slow ventricular response with heart rate as low as 30s.  Cardiology was consulted to assist with management.  Troponins were elevated with a peak of 578.  There was concern for ACS.  Cardiology requested transfer from [facility] to [facility] for possible heart cath.  Hospital course complicated by AKI on CKD 3 which delayed heart cath.  07/29/19: Seen and examined.  Denies chest pain or dyspnea at rest.  She is on 3 L to maintain sats greater than 94%.  Worsening renal function.  IV fluid added.  Will obtain urine analysis and renal ultrasound and consult nephrology.   Assessment/Plan: Principal Problem:   Atrial fibrillation (HCC) Active Problems:   Hypertension   CEREBROVASCULAR DISEASE   Chronic anticoagulation   Acute respiratory disease due to COVID-19 virus   Type 2 diabetes mellitus with stage 3 chronic kidney disease (HCC)   Angina pectoris (HCC)   CKD (chronic kidney disease) stage 3, GFR 30-59 ml/min   Bradycardia  Elevated troponin/Angina Pectoris -Concern for ACS -Troponins 578>>> 506>>> 325>> 153 -Heart cath planned 07/29/19 but canceled due to worsening AKI on CKD 3.  Worsening AKI on CKD 3 Baseline creatinine appears to be 1.4 with GFR of 39 Presented with creatinine of 2.3 with GFR of 21  Creatinine continues to trend upward up to 2.6 with GFR of 19 No urine output recorded in the last 24 hours, unclear if  accurate Continue to avoid nephrotoxins and hypotension Closely monitor urine output Obtain UA and renal ultrasound Consulted nephrology on 07/29/2019, paged Dr. Moshe Cipro Continue daily BMPs Continue to hold off HCTZ  High anion gap metabolic acidosis likely secondary to worsening renal failure Serum bicarb 14 anion gap of 21 Currently on isotonic bicarb drip at 75 cc/h Continue to monitor and continue daily BMPs  Refractory hypovolemic hyponatremia Hyponatremia on presentation sodium level 118 Sodium slightly improving 122 Continue normal saline Continue to monitor serum sodium every 4 hours Nephrology has been consulted  Acute transaminitis in the setting of COVID-19 viral infection Negative acute hepatitis panel Continue to trend with daily CMP's  Repeat CMP in the morning  Resolved hyperkalemia in the setting of bicarb drip Potassium 5.2>> 4.5.  Persistent atrial fibrillation with slow ventricular response -Continue holding metoprolol -Appreciate cardiology follow-up and recommendations. -Anticipated heart cath on 07/29/2019 but delayed due to worsening AKI. -Continue to follow recommendations by cardiology service. Continue to closely monitor on telemetry On heparin drip for CVA prevention as Coumadin is on hold for possible procedure  COVID-19 infection -Initially tested + 07/10/2019 -Repeat SARS-CoV2 RT-PCR is negative O2 saturation is stable -Continue vitamin C, zinc and supportive care.   Hyperlipidemia -Continue statin  Diabetes mellitus type 2 -Continue holding metformin and glipizide -Hemoglobin A1c 7.6 -Continue NovoLog sliding scale  Essential hypertension -Vital signs stable -Calcium channel blockers and beta-blockers held in the setting of bradycardia. -As needed hydralazine has been ordered.   Disposition Plan:    Plan to discharge to home after cardiology signs off and  once she is hemodynamically stable.   Family Communication:    Updated her daughter via phone on 07/28/2019.  Consultants:  cardiology  Code Status:  FULL   DVT Prophylaxis:   Heparin drip.   Objective: Vitals:   07/29/19 0500 07/29/19 0738 07/29/19 0801 07/29/19 0802  BP: (!) 124/46     Pulse: (!) 49 (!) 39 (!) 41   Resp: (!) 23 15 (!) 25 18  Temp: 98 F (36.7 C)     TempSrc: Oral     SpO2: 98% 100% 100%   Weight:      Height:        Intake/Output Summary (Last 24 hours) at 07/29/2019 1544 Last data filed at 07/29/2019 0600 Gross per 24 hour  Intake 741.35 ml  Output 0 ml  Net 741.35 ml   Filed Weights   07/25/19 0500 07/27/19 0500 07/27/19 2146  Weight: 73.5 kg 75.3 kg 76.7 kg    Exam:  . General: 86 y.o. year-old female well-developed well-nourished alert and minimally interactive.   . Cardiovascular: Bradycardic no rubs or gallops.   Marland Kitchen Respiratory: Mild rales at bases no wheezing noted.   . Abdomen: Soft nontender normal bowel sounds present.   . Musculoskeletal: No lower extremity edema bilaterally. Marland Kitchen Psychiatry: Mood is appropriate for condition and setting.   Data Reviewed: CBC: Recent Labs  Lab 07/23/19 0514 07/24/19 0333 07/25/19 0804 07/28/19 0729 07/29/19 0501  WBC 7.9 8.6 9.6 10.6* 13.9*  NEUTROABS 4.6 5.3 7.4 7.0  --   HGB 12.1 10.5* 10.7* 10.3* 9.6*  HCT 38.1 33.2* 34.5* 30.5* 29.0*  MCV 87.0 86.5 89.8 83.1 85.3  PLT 394 402* 372 233 [id number]   Basic Metabolic Panel: Recent Labs  Lab 07/23/19 0514 07/24/19 0333 07/25/19 0804 07/26/19 0810 07/28/19 0729 07/28/19 0831 07/28/19 1749 07/29/19 0101 07/29/19 0501 07/29/19 1040  NA 131* 127* 124* 123* 118* 120* 119*  121* 121* 122* 120*  K 5.0 4.8 5.1 4.7 5.2*  --  5.6*  --  4.7 4.5  CL 98 96* 95* 95* 90*  --  90*  --  87* 85*  CO2 18* 17* 16* 16* 13*  --  15*  --  14* 14*  GLUCOSE 165* 180* 194* 213* 253*  --  206*  --  205* 199*  BUN 24* 26* 20 19 39*  --  43*  --  45* 47*  CREATININE 1.81* 1.81* 1.42* 1.47* 2.37*  --  2.37*  --  2.44* 2.62*   CALCIUM 9.0 8.5* 8.4* 8.3* 8.2*  --  8.0*  --  8.1* 7.9*  MG 1.9 1.8 1.8  --  2.1  --   --   --   --   --   PHOS 3.4 3.1  --   --  3.4  --   --   --   --   --    GFR: Estimated Creatinine Clearance: 15.3 mL/min (A) (by C-G formula based on SCr of 2.62 mg/dL (H)). Liver Function Tests: Recent Labs  Lab 07/23/19 0514 07/24/19 0333 07/25/19 0804 07/28/19 0729 07/29/19 0501  AST 21 21 31  286* 322*  ALT 11 11 14  138* 192*  ALKPHOS 77 70 88 102 94  BILITOT 0.8 0.9 1.2 1.8* 1.8*  PROT 7.5 6.9 6.7 6.6 6.2*  ALBUMIN 3.3* 3.1* 3.1* 3.2* 2.9*   No results for input(s): LIPASE, AMYLASE in the last 168 hours. No results for input(s): AMMONIA in the last 168 hours. Coagulation Profile: Recent Labs  Lab 07/25/19 0804 07/26/19 0810 07/28/19 0831 07/29/19 1040 07/29/19 1458  INR 2.7* 2.7* 1.7* 2.1* 2.0*   Cardiac Enzymes: No results for input(s): CKTOTAL, CKMB, CKMBINDEX, TROPONINI in the last 168 hours. BNP (last 3 results) No results for input(s): PROBNP in the last 8760 hours. HbA1C: No results for input(s): HGBA1C in the last 72 hours. CBG: Recent Labs  Lab 07/28/19 1143 07/28/19 1550 07/28/19 2112 07/29/19 0804 07/29/19 1235  GLUCAP 218* 211* 187* 200* 182*   Lipid Profile: No results for input(s): CHOL, HDL, LDLCALC, TRIG, CHOLHDL, LDLDIRECT in the last 72 hours. Thyroid Function Tests: No results for input(s): TSH, T4TOTAL, FREET4, T3FREE, THYROIDAB in the last 72 hours. Anemia Panel: No results for input(s): VITAMINB12, FOLATE, FERRITIN, TIBC, IRON, RETICCTPCT in the last 72 hours. Urine analysis:    Component Value Date/Time   COLORURINE YELLOW 04/23/2017 0049   APPEARANCEUR HAZY (A) 04/23/2017 0049   LABSPEC 1.021 04/23/2017 0049   PHURINE 5.0 04/23/2017 0049   GLUCOSEU NEGATIVE 04/23/2017 0049   GLUCOSEU NEG mg/dL 07/06/2009 1903   HGBUR MODERATE (A) 04/23/2017 0049   BILIRUBINUR NEGATIVE 04/23/2017 0049   KETONESUR NEGATIVE 04/23/2017 0049   PROTEINUR  100 (A) 04/23/2017 0049   UROBILINOGEN 1 07/06/2009 1903   NITRITE NEGATIVE 04/23/2017 0049   LEUKOCYTESUR NEGATIVE 04/23/2017 0049   Sepsis Labs: @LABRCNTIP (procalcitonin:4,lacticidven:4)  ) Recent Results (from the past 240 hour(s))  Respiratory Panel by RT PCR (Flu A&B, Covid) - Nasopharyngeal Swab     Status: None   Collection Time: 07/19/2019  8:42 AM   Specimen: Nasopharyngeal Swab  Result Value Ref Range Status   SARS Coronavirus 2 by RT PCR NEGATIVE NEGATIVE Final    Comment: (NOTE) SARS-CoV-2 target nucleic acids are NOT DETECTED. The SARS-CoV-2 RNA is generally detectable in upper respiratoy specimens during the acute phase of infection. The lowest concentration of SARS-CoV-2 viral copies this assay can detect is 131 copies/mL. A negative result does not preclude SARS-Cov-2 infection and should not be used as the sole basis for treatment or other patient management decisions. A negative result may occur with  improper specimen collection/handling, submission of specimen other than nasopharyngeal swab, presence of viral mutation(s) within the areas targeted by this assay, and inadequate number of viral copies (<131 copies/mL). A negative result must be combined with clinical observations, patient history, and epidemiological information. The expected result is Negative. Fact Sheet for Patients:  PinkCheek.be Fact Sheet for Healthcare Providers:  GravelBags.it This test is not yet ap proved or cleared by the [place] FDA and  has been authorized for detection and/or diagnosis of SARS-CoV-2 by FDA under an Emergency Use Authorization (EUA). This EUA will remain  in effect (meaning this test can be used) for the duration of the COVID-19 declaration under Section 564(b)(1) of the Act, 21 U.S.C. section 360bbb-3(b)(1), unless the authorization is terminated or revoked sooner.    Influenza A by PCR NEGATIVE  NEGATIVE Final   Influenza B by PCR NEGATIVE NEGATIVE Final    Comment: (NOTE) The Xpert Xpress SARS-CoV-2/FLU/RSV assay is intended as an aid in  the diagnosis of influenza from Nasopharyngeal swab specimens and  should not be used as a sole basis for treatment. Nasal washings and  aspirates are unacceptable for Xpert Xpress SARS-CoV-2/FLU/RSV  testing. Fact Sheet for Patients: PinkCheek.be Fact Sheet for Healthcare Providers: GravelBags.it This test is not yet approved or cleared by the [place] FDA and  has been authorized for detection and/or diagnosis of SARS-CoV-2 by  FDA under an Emergency Use Authorization (EUA). This EUA will remain  in effect (meaning this test can be used) for the duration of the  Covid-19 declaration under Section 564(b)(1) of the Act, 21  U.S.C. section 360bbb-3(b)(1), unless the authorization is  terminated or revoked. Performed at [facility], [address] [place], [place] [address]       Studies: No results found.  Scheduled Meds: . vitamin C  500 mg Oral Daily  . aspirin EC  81 mg Oral Daily  . cholecalciferol  1,000 Units Oral Daily  . feeding supplement  1 Container Oral TID BM  . insulin aspart  0-5 Units Subcutaneous QHS  . insulin aspart  0-9 Units Subcutaneous TID WC  . multivitamin with minerals  1 tablet Oral Daily  . pantoprazole  40 mg Oral Daily  . simvastatin  20 mg Oral QHS  . sodium bicarbonate  650 mg Oral BID  . sodium chloride flush  3 mL Intravenous Q12H  . sodium chloride flush  3 mL Intravenous Q12H  . zinc sulfate  220 mg Oral Daily    Continuous Infusions: . sodium chloride    . sodium chloride 100 mL/hr at 07/29/19 1150  .  sodium bicarbonate (isotonic) infusion in sterile water 75 mL/hr at 07/29/19 0033     LOS: 7 days     Kayleen Memos, MD [facility] Pager [phone]  If 7PM-7AM, please contact  night-coverage [email or web address] Password [facility] 07/29/2019, 3:44 PM

## 2019-07-29 NOTE — Progress Notes (Signed)
SLP Cancellation Note  Patient Details Name: Katrina Arnold MRN: [id number] DOB: 1933-02-07   Cancelled treatment:       Reason Eval/Treat Not Completed: Patient at procedure or test/unavailable. Pt NPO for possible procedure. Will f/u when appropriate.    Meyer Arora, [name] 07/29/2019, 10:13 AM

## 2019-07-30 ENCOUNTER — Inpatient Hospital Stay (HOSPITAL_COMMUNITY): Payer: Medicare HMO

## 2019-07-30 DIAGNOSIS — J9601 Acute respiratory failure with hypoxia: Secondary | ICD-10-CM

## 2019-07-30 DIAGNOSIS — N179 Acute kidney failure, unspecified: Secondary | ICD-10-CM | POA: Diagnosis not present

## 2019-07-30 DIAGNOSIS — Z66 Do not resuscitate: Secondary | ICD-10-CM | POA: Diagnosis not present

## 2019-07-30 DIAGNOSIS — R9431 Abnormal electrocardiogram [ECG] [EKG]: Secondary | ICD-10-CM | POA: Diagnosis not present

## 2019-07-30 DIAGNOSIS — R570 Cardiogenic shock: Secondary | ICD-10-CM

## 2019-07-30 DIAGNOSIS — G9341 Metabolic encephalopathy: Secondary | ICD-10-CM | POA: Diagnosis not present

## 2019-07-30 DIAGNOSIS — J069 Acute upper respiratory infection, unspecified: Secondary | ICD-10-CM

## 2019-07-30 DIAGNOSIS — I4811 Longstanding persistent atrial fibrillation: Secondary | ICD-10-CM | POA: Diagnosis not present

## 2019-07-30 DIAGNOSIS — D631 Anemia in chronic kidney disease: Secondary | ICD-10-CM | POA: Diagnosis not present

## 2019-07-30 DIAGNOSIS — U071 COVID-19: Secondary | ICD-10-CM

## 2019-07-30 DIAGNOSIS — I4819 Other persistent atrial fibrillation: Secondary | ICD-10-CM | POA: Diagnosis not present

## 2019-07-30 DIAGNOSIS — Z20822 Contact with and (suspected) exposure to covid-19: Secondary | ICD-10-CM | POA: Diagnosis not present

## 2019-07-30 DIAGNOSIS — R001 Bradycardia, unspecified: Secondary | ICD-10-CM | POA: Diagnosis not present

## 2019-07-30 DIAGNOSIS — Z515 Encounter for palliative care: Secondary | ICD-10-CM

## 2019-07-30 DIAGNOSIS — T68XXXA Hypothermia, initial encounter: Secondary | ICD-10-CM

## 2019-07-30 DIAGNOSIS — Z8616 Personal history of COVID-19: Secondary | ICD-10-CM | POA: Diagnosis not present

## 2019-07-30 DIAGNOSIS — E871 Hypo-osmolality and hyponatremia: Secondary | ICD-10-CM | POA: Diagnosis not present

## 2019-07-30 DIAGNOSIS — E872 Acidosis: Secondary | ICD-10-CM | POA: Diagnosis not present

## 2019-07-30 DIAGNOSIS — N183 Chronic kidney disease, stage 3 unspecified: Secondary | ICD-10-CM | POA: Diagnosis not present

## 2019-07-30 DIAGNOSIS — I959 Hypotension, unspecified: Secondary | ICD-10-CM

## 2019-07-30 DIAGNOSIS — I129 Hypertensive chronic kidney disease with stage 1 through stage 4 chronic kidney disease, or unspecified chronic kidney disease: Secondary | ICD-10-CM | POA: Diagnosis not present

## 2019-07-30 LAB — CBC
HCT: 28.1 % — ABNORMAL LOW (ref 36.0–46.0)
Hemoglobin: 8.8 g/dL — ABNORMAL LOW (ref 12.0–15.0)
MCH: 28 pg (ref 26.0–34.0)
MCHC: 31.3 g/dL (ref 30.0–36.0)
MCV: 89.5 fL (ref 80.0–100.0)
Platelets: 143 10*3/uL — ABNORMAL LOW (ref 150–400)
RBC: 3.14 MIL/uL — ABNORMAL LOW (ref 3.87–5.11)
RDW: 16.4 % — ABNORMAL HIGH (ref 11.5–15.5)
WBC: 15.3 10*3/uL — ABNORMAL HIGH (ref 4.0–10.5)
nRBC: 1.5 % — ABNORMAL HIGH (ref 0.0–0.2)

## 2019-07-30 LAB — COMPREHENSIVE METABOLIC PANEL
ALT: 352 U/L — ABNORMAL HIGH (ref 0–44)
AST: 705 U/L — ABNORMAL HIGH (ref 15–41)
Albumin: 2.8 g/dL — ABNORMAL LOW (ref 3.5–5.0)
Alkaline Phosphatase: 81 U/L (ref 38–126)
Anion gap: 29 — ABNORMAL HIGH (ref 5–15)
BUN: 53 mg/dL — ABNORMAL HIGH (ref 8–23)
CO2: 10 mmol/L — ABNORMAL LOW (ref 22–32)
Calcium: 7.5 mg/dL — ABNORMAL LOW (ref 8.9–10.3)
Chloride: 86 mmol/L — ABNORMAL LOW (ref 98–111)
Creatinine, Ser: 3.32 mg/dL — ABNORMAL HIGH (ref 0.44–1.00)
GFR calc Af Amer: 14 mL/min — ABNORMAL LOW (ref >60)
GFR calc non Af Amer: 12 mL/min — ABNORMAL LOW (ref >60)
Glucose, Bld: 128 mg/dL — ABNORMAL HIGH (ref 70–99)
Potassium: 4.7 mmol/L (ref 3.5–5.1)
Sodium: 125 mmol/L — ABNORMAL LOW (ref 135–145)
Total Bilirubin: 2.4 mg/dL — ABNORMAL HIGH (ref 0.3–1.2)
Total Protein: 6.1 g/dL — ABNORMAL LOW (ref 6.5–8.1)

## 2019-07-30 LAB — PROTIME-INR
INR: 2.4 — ABNORMAL HIGH (ref 0.8–1.2)
Prothrombin Time: 25.7 seconds — ABNORMAL HIGH (ref 11.4–15.2)

## 2019-07-30 LAB — SODIUM: Sodium: 122 mmol/L — ABNORMAL LOW (ref 135–145)

## 2019-07-30 LAB — ECHOCARDIOGRAM LIMITED
Height: 62 in
Weight: 2899.49 oz

## 2019-07-30 LAB — GLUCOSE, CAPILLARY
Glucose-Capillary: 118 mg/dL — ABNORMAL HIGH (ref 70–99)
Glucose-Capillary: 120 mg/dL — ABNORMAL HIGH (ref 70–99)
Glucose-Capillary: 98 mg/dL (ref 70–99)

## 2019-07-30 LAB — TROPONIN I (HIGH SENSITIVITY): Troponin I (High Sensitivity): 169 ng/L (ref <18)

## 2019-07-30 MED ORDER — ACETAMINOPHEN 325 MG PO TABS: 650.0000 mg | ORAL_TABLET | Freq: Four times a day (QID) | ORAL | Status: DC | PRN

## 2019-07-30 MED ORDER — MORPHINE SULFATE (PF) 4 MG/ML IV SOLN
INTRAVENOUS | Status: AC
  Administered 2019-07-30: 4 mg
  Filled 2019-07-30: qty 1

## 2019-07-30 MED ORDER — HYDROMORPHONE BOLUS VIA INFUSION
1.0000 mg | INTRAVENOUS | Status: DC | PRN
  Filled 2019-07-30: qty 1

## 2019-07-30 MED ORDER — MORPHINE SULFATE (PF) 2 MG/ML IV SOLN: 1.0000 mg | INTRAVENOUS | Status: DC | PRN

## 2019-07-30 MED ORDER — GLYCOPYRROLATE 1 MG PO TABS: 1.0000 mg | ORAL_TABLET | ORAL | Status: DC | PRN

## 2019-07-30 MED ORDER — MIDAZOLAM HCL 2 MG/2ML IJ SOLN
2.0000 mg | Freq: Once | INTRAMUSCULAR | Status: AC
  Administered 2019-07-30: 2 mg via INTRAVENOUS

## 2019-07-30 MED ORDER — GLYCOPYRROLATE 0.2 MG/ML IJ SOLN: 0.2000 mg | INTRAMUSCULAR | Status: DC | PRN

## 2019-07-30 MED ORDER — HALOPERIDOL LACTATE 2 MG/ML PO CONC
0.5000 mg | ORAL | Status: DC | PRN
  Filled 2019-07-30: qty 0.3

## 2019-07-30 MED ORDER — HALOPERIDOL LACTATE 5 MG/ML IJ SOLN: 0.5000 mg | INTRAMUSCULAR | Status: DC | PRN

## 2019-07-30 MED ORDER — HYDROMORPHONE HCL 1 MG/ML IJ SOLN
1.0000 mg | INTRAMUSCULAR | Status: DC | PRN
  Administered 2019-07-30: 1 mg via INTRAVENOUS
  Filled 2019-07-30: qty 1

## 2019-07-30 MED ORDER — SODIUM ZIRCONIUM CYCLOSILICATE 10 G PO PACK: 10.0000 g | PACK | Freq: Once | ORAL | Status: DC

## 2019-07-30 MED ORDER — LORAZEPAM 2 MG/ML IJ SOLN: 1.0000 mg | INTRAMUSCULAR | Status: DC | PRN

## 2019-07-30 MED ORDER — MORPHINE SULFATE (PF) 4 MG/ML IV SOLN: 4.0000 mg | Freq: Once | INTRAVENOUS | Status: DC

## 2019-07-30 MED ORDER — ONDANSETRON HCL 4 MG/2ML IJ SOLN: 4.0000 mg | Freq: Four times a day (QID) | INTRAMUSCULAR | Status: DC | PRN

## 2019-07-30 MED ORDER — MIDAZOLAM HCL 2 MG/2ML IJ SOLN
INTRAMUSCULAR | Status: AC
  Filled 2019-07-30: qty 2

## 2019-07-30 MED ORDER — BIOTENE DRY MOUTH MT LIQD: 15.0000 mL | OROMUCOSAL | Status: DC | PRN

## 2019-07-30 MED ORDER — CHLORHEXIDINE GLUCONATE CLOTH 2 % EX PADS: 6.0000 | MEDICATED_PAD | Freq: Every day | CUTANEOUS | Status: DC

## 2019-07-30 MED ORDER — ONDANSETRON 4 MG PO TBDP: 4.0000 mg | ORAL_TABLET | Freq: Four times a day (QID) | ORAL | Status: DC | PRN

## 2019-07-30 MED ORDER — HALOPERIDOL 0.5 MG PO TABS: 0.5000 mg | ORAL_TABLET | ORAL | Status: DC | PRN

## 2019-07-30 MED ORDER — DOPAMINE-DEXTROSE 3.2-5 MG/ML-% IV SOLN
0.0000 ug/kg/min | INTRAVENOUS | Status: DC
  Administered 2019-07-30: 10 ug/kg/min via INTRAVENOUS

## 2019-07-30 MED ORDER — ATROPINE SULFATE 1 MG/10ML IJ SOSY
1.0000 mg | PREFILLED_SYRINGE | Freq: Once | INTRAMUSCULAR | Status: AC
  Administered 2019-07-30: 1 mg via INTRAVENOUS

## 2019-07-30 MED ORDER — ACETAMINOPHEN 650 MG RE SUPP: 650.0000 mg | Freq: Four times a day (QID) | RECTAL | Status: DC | PRN

## 2019-07-30 MED ORDER — HALOPERIDOL LACTATE 2 MG/ML PO CONC: 2.0000 mg | Freq: Four times a day (QID) | ORAL | 0 refills | Status: AC | PRN

## 2019-07-30 MED ORDER — FLUMAZENIL 1 MG/10ML IV SOLN
0.2000 mg | Freq: Once | INTRAVENOUS | Status: DC
  Filled 2019-07-30: qty 2

## 2019-07-30 MED ORDER — HYDROMORPHONE HCL 1 MG/ML PO LIQD: 2.0000 mg | ORAL | 0 refills | Status: AC | PRN

## 2019-07-30 MED ORDER — SODIUM BICARBONATE 8.4 % IV SOLN
100.0000 meq | Freq: Once | INTRAVENOUS | Status: AC
  Administered 2019-07-30: 100 meq via INTRAVENOUS

## 2019-07-30 MED ORDER — POLYVINYL ALCOHOL 1.4 % OP SOLN
1.0000 [drp] | Freq: Four times a day (QID) | OPHTHALMIC | Status: DC | PRN
  Filled 2019-07-30: qty 15

## 2019-07-30 MED ORDER — ATROPINE SULFATE 0.4 MG/ML IJ SOLN
0.2000 mg | Freq: Once | INTRAMUSCULAR | Status: AC
  Administered 2019-07-30: 0.2 mg via INTRAVENOUS

## 2019-07-30 MED ORDER — SODIUM CHLORIDE 0.9 % IV SOLN
0.5000 mg/h | INTRAVENOUS | Status: DC
  Filled 2019-07-30: qty 2.5

## 2019-07-30 MED FILL — Medication: Qty: 1 | Status: AC

## 2019-08-08 SURGERY — LEFT HEART CATH AND CORONARY ANGIOGRAPHY
Anesthesia: LOCAL

## 2019-08-19 NOTE — Consult Note (Addendum)
NAME:  Katrina Arnold, MRN:  [id number], DOB:  1932-12-02, LOS: 8 ADMISSION DATE:  08/04/2019, CONSULTATION DATE:  25-Aug-2019 REFERRING MD:  [place] Crane  CHIEF COMPLAINT:  Bradycardia, AMS   Brief History / HPI:  Katrina Arnold is a 86 y.o. female with PMH as below who was admitted to [facility] 07/25/2019 with AF with slow ventricular response.  Had tested positive for COVID 12/23 as did husband who required admission to [facility].  She was transferred to [facility] where she was seen by cards.  There was concerned for ACS and ideally pt would have had cardiac cath performed; however, deferred due to AoCKD. AM 1/12, had dyspnea and worsening bradycardia along with AMS.  Also with worsening multi organ failure.  She was started on dopamine and was transferred to the ICU.  On arrival to ICU, she was essentially unresponsive.  Family was called (daughter Butch Penny) and notified that pt would require intubation if family wished for aggressive care.  Butch Penny informed us that her and her sister discussed things and ultimately, pt would not want any heroic measures including life support.  DNR established; however, shortly after, pt worsened with HR sustaining in 30's despite 10 dopamine. Mental status and respiratory status also worsened with paradoxical pattern and accessory muscle use.  Discussed with daughter Butch Penny again who opted to transition to comfort care.  Past Medical History  has Hx of Adenocarcinoma of colon; DIABETES MELLITUS, TYPE II; HYPERLIPIDEMIA; ANEMIA; Hypertension; Atrial fibrillation ([place]); CEREBROVASCULAR DISEASE; CHRONIC KIDNEY DISEASE STAGE II (MILD); Chronic anticoagulation; Encounter for therapeutic drug monitoring; Acute respiratory disease due to COVID-19 virus; Type 2 diabetes mellitus with stage 3 chronic kidney disease ([place]); Angina pectoris (HCC); CKD (chronic kidney disease) stage 3, GFR 30-59 ml/min; and Bradycardia on their problem list.  Significant Hospital Events   1/4 > admit. 1/12 > transfer to ICU, transitioned  to comfort care.  Consults:  Cardiology, Nephrology, PCCM.  Procedures:  None.  Significant Diagnostic Tests:  CXR 1/11 > small effusions.  Micro Data:  Flu 1/4 > neg. SARS CoV 1/4 > neg.  Antimicrobials:  None.   Interim history/subjective:  Unresponsive. Slow paradoxical breathing.  Almost agonal.  Objective:  Blood pressure (!) 128/47, pulse (!) 38, temperature (!) 93.7 F (34.3 C), temperature source Rectal, resp. rate (!) 27, height 5\' 2"  (1.575 m), weight 82.2 kg, SpO2 100 %.        Intake/Output Summary (Last 24 hours) at 08-25-19 1014 Last data filed at 08-25-2019 0300 Gross per 24 hour  Intake 2511.99 ml  Output 750 ml  Net 1761.99 ml   Filed Weights   07/27/19 0500 07/27/19 2146 08/25/2019 0507  Weight: 75.3 kg 76.7 kg 82.2 kg    Examination: General: Adult female, actively dying. Neuro: Unresponsive. HEENT: [place]/AT. MM moist. Cardiovascular: Brady, irregular, no M/R/G.  Lungs: Respirations even and unlabored.  CTA bilaterally, No W/R/R.  Abdomen: BS x 4, soft, NT/ND.  Musculoskeletal: No gross deformities, no edema.  Skin: Intact, warm, no rashes.  Assessment & Plan:   Elevated troponin / NSTEMI. A.fib with profound bradycardia. AoCKD. Hyponatremia. Transaminitis. AMS. COVID 19 +.  Unfortunately pt has declined and is actively dying.  Upon arrival to ICU, she was minimally responsive and not protecting her airway.  She had ongoing bradycardia despite 10 dopamine.  I called pt's daughter Butch Penny and had an extensive discussion with her regarding pts hospital course, current circumstances and organ failures.  Butch Penny called her sister for further family discussions.  After  discussing with her sister, she informed me that given her mothers prior wishes of not wanting to be placed on any life support machines, she felt that her and her sister needed to honor those wishes.   DNR orders were placed on the chart; however, shortly after, pt declined further.  I  called Butch Penny again and informed her that I felt that her mother was actively dying and did not expect her to live through the morning.  She expressed understanding and decided to transition to full comfort care.  Condolences and emotional support provided.   Labs   CBC: Recent Labs  Lab 07/24/19 0333 07/25/19 0804 07/28/19 0729 07/29/19 0501 08/06/2019 0645  WBC 8.6 9.6 10.6* 13.9* 15.3*  NEUTROABS 5.3 7.4 7.0  --   --   HGB 10.5* 10.7* 10.3* 9.6* 8.8*  HCT 33.2* 34.5* 30.5* 29.0* 28.1*  MCV 86.5 89.8 83.1 85.3 89.5  PLT 402* 372 233 222 [id number]*   Basic Metabolic Panel: Recent Labs  Lab 07/24/19 0333 07/25/19 0804 07/28/19 0729 07/28/19 1749 07/29/19 0501 07/29/19 1040 07/29/19 1458 07/29/19 1824 07/29/19 2101 08/06/19 0101 2019-08-06 0645  NA 127* 124* 118* 119*  121* 122* 120* 122* 121* 122* 122* 125*  K 4.8 5.1 5.2* 5.6* 4.7 4.5  --   --   --   --  4.7  CL 96* 95* 90* 90* 87* 85*  --   --   --   --  86*  CO2 17* 16* 13* 15* 14* 14*  --   --   --   --  10*  GLUCOSE 180* 194* 253* 206* 205* 199*  --   --   --   --  128*  BUN 26* 20 39* 43* 45* 47*  --   --   --   --  53*  CREATININE 1.81* 1.42* 2.37* 2.37* 2.44* 2.62*  --   --   --   --  3.32*  CALCIUM 8.5* 8.4* 8.2* 8.0* 8.1* 7.9*  --   --   --   --  7.5*  MG 1.8 1.8 2.1  --   --   --   --   --   --   --   --   PHOS 3.1  --  3.4  --   --   --   --   --   --   --   --    GFR: Estimated Creatinine Clearance: 12.5 mL/min (A) (by C-G formula based on SCr of 3.32 mg/dL (H)). Recent Labs  Lab 07/25/19 0804 07/28/19 0729 07/29/19 0501 2019/08/06 0645  WBC 9.6 10.6* 13.9* 15.3*   Liver Function Tests: Recent Labs  Lab 07/24/19 0333 07/25/19 0804 07/28/19 0729 07/29/19 0501 August 06, 2019 0645  AST 21 31 286* 322* 705*  ALT 11 14 138* 192* 352*  ALKPHOS 70 88 102 94 81  BILITOT 0.9 1.2 1.8* 1.8* 2.4*  PROT 6.9 6.7 6.6 6.2* 6.1*  ALBUMIN 3.1* 3.1* 3.2* 2.9* 2.8*   No results for input(s): LIPASE, AMYLASE in the last 168  hours. No results for input(s): AMMONIA in the last 168 hours. ABG No results found for: PHART, PCO2ART, PO2ART, HCO3, TCO2, ACIDBASEDEF, O2SAT  Coagulation Profile: Recent Labs  Lab 07/26/19 0810 07/28/19 0831 07/29/19 1040 07/29/19 1458 2019-08-06 0645  INR 2.7* 1.7* 2.1* 2.0* 2.4*   Cardiac Enzymes: No results for input(s): CKTOTAL, CKMB, CKMBINDEX, TROPONINI in the last 168 hours. HbA1C: Hgb A1c MFr Bld  Date/Time Value Ref  Range Status  07/23/2019 05:14 AM 7.6 (H) 4.8 - 5.6 % Final    Comment:    (NOTE) Pre diabetes:          5.7%-6.4% Diabetes:              >6.4% Glycemic control for   <7.0% adults with diabetes   09/12/2011 09:55 AM 7.7 (H) <5.7 % Final    Comment:                                                                           According to the ADA Clinical Practice Recommendations for 2011, when HbA1c is used as a screening test:     >=6.5%   Diagnostic of Diabetes Mellitus            (if abnormal result is confirmed)   5.7-6.4%   Increased risk of developing Diabetes Mellitus   References:Diagnosis and Classification of Diabetes Mellitus,Diabetes [id number] 1):S62-S69 and Standards of Medical Care in         Diabetes - 2011,Diabetes Care,2011,34 (Suppl 1):S11-S61.   CBG: Recent Labs  Lab 07/29/19 1636 07/29/19 2114 23-Aug-2019 0454 Aug 23, 2019 0808 2019/08/23 0942  GLUCAP 161* 145* 118* 120* 98    Review of Systems:   Unable to obtain as pt is encephalopathic.  Past medical history  She,  has a past medical history of Atrial fibrillation, chronic ([place]), Cerebrovascular disease, Chronic anticoagulation, Chronic kidney disease, stage 3, mod decreased GFR, Colon adenocarcinoma ([place]) (2009), Diabetes mellitus, type 2 ([place]), Dupuytren's contracture, Hyperlipidemia, and Hypertension.   Surgical History    Past Surgical History:  Procedure Laterality Date  . APPENDECTOMY    . COLONOSCOPY  6/23/200   Rourk: Left diverticula, multiple polyps,  adenocarcinoma of colon 8 cm distal to ICV  . COLONOSCOPY  07/02/2012   Procedure: COLONOSCOPY;  Surgeon: Daneil Dolin, MD;  Location: [facility]O SUITE;  Service: Endoscopy;  Laterality: N/A;  9:30  . COLONOSCOPY W/ POLYPECTOMY  05/2009   Rourk: internal hemorrhoidal tag, site of piecemeal polypectomy in  August identified, residual polypectomy.  Multiple tubular adenomas/tubulovillous  . HEMICOLECTOMY  12/2007   Right, adenocarcinoma  . TUBAL LIGATION       Social History   reports that she has never smoked. Her smokeless tobacco use includes snuff. She reports that she does not drink alcohol or use drugs.   Family history   Her family history includes CAD in her sister and sister; COPD in her brother; Cancer in her brother; Diabetes in her brother and mother; Heart attack in her father; Lung cancer in her son.   Allergies No Known Allergies   Home meds  Prior to Admission medications   Medication Sig Start Date End Date Taking? Authorizing Provider  amLODipine (NORVASC) 5 MG tablet Take 1 tablet (5 mg total) by mouth daily. 09/25/13  Yes BranchAlphonse Guild, MD  glipiZIDE (GLUCOTROL XL) 2.5 MG 24 hr tablet Take 2.5 mg by mouth daily.     Yes [provider]  hydrochlorothiazide (MICROZIDE) 12.5 MG capsule Take 12.5 mg by mouth daily.   Yes [provider]  metFORMIN (GLUCOPHAGE) 500 MG tablet Take 500 mg by mouth 2 (two) times daily with a meal.  Yes [provider]  metoprolol (LOPRESSOR) 50 MG tablet Take 50 mg by mouth 2 (two) times daily.     Yes [provider]  omeprazole (PRILOSEC) 20 MG capsule Take 20 mg by mouth daily.     Yes [provider]  potassium chloride SA (K-DUR,KLOR-CON) 20 MEQ tablet TAKE 1 TABLET BY MOUTH EVERY DAY Patient taking differently: Take 20 mEq by mouth daily.  08/18/11  Yes Wall, Marijo Conception, MD  simvastatin (ZOCOR) 20 MG tablet Take 20 mg by mouth at bedtime.    Yes [provider]  warfarin  (COUMADIN) 5 MG tablet TAKE 1 TABLET BY MOUTH DAILY EXCEPT TAKE 1/2 TABLET ON MONDAYS, [place] Patient taking differently: Take 5 mg by mouth See admin instructions. TAKE 2.5 MG ON MONDAYS, WEDNESDAYS AND FRIDAYS. TAKE 5 MG ON OTHER DAYS 03/26/19  Yes Branch, Alphonse Guild, MD     Montey Hora, [facility] 08/29/19, 10:14 AM  Attending Note:  86 year old with extensive PMH who presents to [facility] with COVID-19 infection, NSTEMI, AKI and acute liver injury.  Transferred to the ICU for bradycardia, hypotension, AMS and acute respiratory failure with hypoxemia.  Upon arrival, patient appears to be actively dying on exam.  All labs reviewed and notes reviewed.  I see no chances of meaningful recovery here given extent of damage of multi-organs and the age as well as COVID status.  Given above, we had an extensive conversation with the daughter.  After discussion, decision was made to make patient a full DNR and focus more on comfort.  Given the fact that I do not believe the patient will survive for long given vital signs, will keep in the ICU and focus more on comfort.  D/C dopamine.  O2 via [place] only, no BiPAP as patient can not protect her airway.  Message left for Dr. [place] Crane that we will keep the patient on [facility] service, focus more on comfort and PCCM will sign off, please call back if needed.  The patient is critically ill with multiple organ systems failure and requires high complexity decision making for assessment and support, frequent evaluation and titration of therapies, application of advanced monitoring technologies and extensive interpretation of multiple databases.   Critical Care Time devoted to patient care services described in this note is  37  Minutes. This time reflects time of care of this signee Dr Jennet Maduro. This critical care time does not reflect procedure time, or teaching time or supervisory time of PA/NP/Med student/Med Resident etc  but could involve care discussion time.  Rush Farmer, M.D. [facility].

## 2019-08-19 NOTE — Progress Notes (Signed)
Assisted tele visit to patient with family member.  Rasheedah Reis R, RN  

## 2019-08-19 NOTE — Progress Notes (Signed)
Called to see patient emergently for dyspnea and bradycardia Patient with poor MS response Hypothermic 93 degrees Skin cool  Rales at base  No new murmurs Abdomen firm no rebound  Trace LE edema  Worsening multisystem failure and bradycardia Normally in slow afbi rates 60's Now in 40's  Some improvement with atropine   BP stable 130/55 mmHg  Labs worse Hct down to 28.1 from 34.5 5 days ago WBC elevated 15.3 from 13.9 Cr up to 3.3 with K 4.7 and elevated LFTls   Plan:  Rx with atropine Start dopamine 5ug/min  CCM consult will need central line  Avoid temporary pacer if possible Give one dose of Lokelma as renal function only getting worse and K 4.7 Transfer to unit In my opinion does not need COVID restrictions Has tested  Negative and been in hospital over 2 weeks Stat echo to see if EF markedly changed was 60-65% 1/4 patient acting Like cardiogenic shock Warming blanket as well try to elevated core body temp  Sats are 100% and outside MS changes hopefully will not need intubation   Jenkins Rouge

## 2019-08-19 NOTE — Progress Notes (Signed)
ANTICOAGULATION CONSULT NOTE   Pharmacy Consult for heparin  Indication: chest pain/ACS  No Known Allergies  Patient Measurements: Height: 5\' 2"  (157.5 cm) Weight: 181 lb 3.5 oz (82.2 kg) IBW/kg (Calculated) : 50.1 Heparin Dosing Weight: 66.8 kg  Vital Signs: Temp: 93.7 F (34.3 C) (01/12 0600) Temp Source: Rectal (01/12 0600) BP: 137/51 (01/12 0600) Pulse Rate: 40 (01/12 0600)  Labs: Recent Labs    07/28/19 0729 07/28/19 0729 07/28/19 0831 07/28/19 2135 07/29/19 0501 07/29/19 1040 07/29/19 1458 08-13-2019 0645  HGB 10.3*  --   --   --  9.6*  --   --  8.8*  HCT 30.5*  --   --   --  29.0*  --   --  28.1*  PLT 233  --   --   --  222  --   --  143*  LABPROT  --    < > 20.0*  --   --  23.5* 22.9* 25.7*  INR  --    < > 1.7*  --   --  2.1* 2.0* 2.4*  HEPARINUNFRC  --   --   --  0.19*  --  0.80*  --   --   CREATININE 2.37*   < >  --   --  2.44* 2.62*  --  3.32*  TROPONINIHS  --   --  153*  --   --   --   --   --    < > = values in this interval not displayed.    Estimated Creatinine Clearance: 12.5 mL/min (A) (by C-G formula based on SCr of 3.32 mg/dL (H)).   Medical History: Past Medical History:  Diagnosis Date  . Atrial fibrillation, chronic ([place])   . Cerebrovascular disease    Left cerebral CVA 2007; residual right sided weakness; 7/09 mild plaque; no focal stenosis  . Chronic anticoagulation   . Chronic kidney disease, stage 3, mod decreased GFR    Creatinine of 1.5 in 10/2008; h/o hyper- and hypokalemia  . Colon adenocarcinoma ([place]) 2009   s/p right hemicolectomy  . Diabetes mellitus, type 2 ([place])   . Dupuytren's contracture   . Hyperlipidemia   . Hypertension     Medications:  Scheduled:  . vitamin C  500 mg Oral Daily  . aspirin EC  81 mg Oral Daily  . cholecalciferol  1,000 Units Oral Daily  . feeding supplement  1 Container Oral TID BM  . insulin aspart  0-5 Units Subcutaneous QHS  . insulin aspart  0-9 Units Subcutaneous TID WC  . multivitamin  with minerals  1 tablet Oral Daily  . pantoprazole  40 mg Oral Daily  . sodium bicarbonate  650 mg Oral BID  . sodium chloride flush  3 mL Intravenous Q12H  . sodium chloride flush  3 mL Intravenous Q12H  . zinc sulfate  220 mg Oral Daily   Infusions:  . sodium chloride    . sodium chloride 50 mL/hr at 08/13/2019 0208  .  sodium bicarbonate (isotonic) infusion in sterile water 75 mL/hr at 07/29/19 0033    Assessment: 86 y/o Female transferred from [facility] with suspected ACS. She was taking warfarin PTA for AF. Pharmacy was consulted to dose heparin IV when the patient's INR was <2. INR was 1.7 when drip initiated, however INR yesterday was up to 2.1 (2 on repeat). Timing of last dose of Warfarin is unknown. INR up further to 2.4 today  Will hold heparin for now  and repeat INR at 1400. If INR <2, will reinitiate heparin drip.     Goal of Therapy:  Heparin level 0.3-0.7 units/ml Monitor platelets by anticoagulation protocol: Yes   Plan:  Hold heparin drip for now Repeat INR at 1400 and reinitiate heparin drip once <2. Monitor for s/sx of bleeding   Jarmaine Ehrler A. Levada Dy, PharmD, BCPS, FNKF Clinical Pharmacist [place] Please utilize Amion for appropriate phone number to reach the unit pharmacist ([place])    22-Aug-2019 8:44 AM

## 2019-08-19 NOTE — Progress Notes (Signed)
Events noted.  Pt decompensated early this AM and required transfer to ICU.  CCM did excellent job of managing-  Confirmed pt wishes for DNR status with family.  Now focus will be comfort.  Renal will sign off   Louis Meckel

## 2019-08-19 NOTE — Progress Notes (Signed)
  Echocardiogram 2D Echocardiogram has been performed.  Bobbye Charleston 2019/08/04, 9:59 AM

## 2019-08-19 NOTE — Progress Notes (Signed)
Have not initiated dilaudid gtt yet, pt remains appearing comfortable w/o any distress. Family appreciative of comfort measures and hope she will sustain to tx home if possible tomorrow.

## 2019-08-19 NOTE — Progress Notes (Signed)
PROGRESS NOTE  Katrina Arnold [id number] DOB: [birth year] DOA: 08/07/2019 PCP: Rosita Fire, MD  HPI/Recap of past 53 hours: 86 year old female with a history of chronic atrial fibrillation on warfarin, hypertension, hyperlipidemia, diabetes mellitus type 2 presenting with chest discomfort and shortness of breath.  She had some associated nausea and emesis.  Diagnosed with COVID-19 on 07/10/2019 and her husband is currently admitted to [facility] 2 days prior to this admission.  Found to have atrial fibrillation with slow ventricular response with heart rate as low as 30s.  Cardiology was consulted to assist with management.  Troponins were elevated with a peak of 578.  There was concern for ACS.  Cardiology requested transfer from [facility] for possible heart cath.  Arrived at [facility] the evening/early morning of 07/28/19.  Heart cath delayed due to worsening renal function for which nephrology was consulted.  Also had some electrolyte abnormalities with hypovolemic hyponatremia, high anion gap metabolic acidosis in the setting of renal failure.  Acute transaminitis with worsening up-trending AST and ALT.  In the morning of 08/12/19 received a page around [place] from bedside RN regarding minimal responsiveness.  Upon entering the room, patient was minimally responsive with HR in the low 30's and hypotensive with MAP in the mid 50's.  Gave stat atropine 0.2 mg x 2.  Obtained stat 12 lead EKG and stat troponin.  Bear hugger applied for rectal temp 93.9.  Received 2 amps of bicarb for serum bicarb of 10.  Discussed with cardiology Dr. Martinique via phone and Dr. Johnsie Cancel at bedside.  Ordered dopamine drip, stat 2D echo per cardiology request and transferred to ICU to continue pressors.  Discussed with PCCM, Dr. Nelda Marseille accepted the patient.  While in the ICU due to multiorgan systems failure and poor prognosis, family made decision for DNR and comfort care only.   Dopamine dc'ed and focus more on comfort.  Assessment/Plan: Principal Problem:   Atrial fibrillation (HCC) Active Problems:   Hypertension   CEREBROVASCULAR DISEASE   Chronic anticoagulation   Acute respiratory disease due to COVID-19 virus   Type 2 diabetes mellitus with stage 3 chronic kidney disease (HCC)   Angina pectoris (HCC)   CKD (chronic kidney disease) stage 3, GFR 30-59 ml/min   Bradycardia  Assessment: Elevated troponin/Angina Pectoris Worsening AKI on CKD 3 Hypothermia Bradycardia Hyperkalemia Hypotension in the setting of history of essential hypertension High anion gap metabolic acidosis in the setting of worsening renal failure Severe hyponatremia Transaminitis Hyperkalemia Persistent atrial fibrillation with slow ventricular response COVID-19 viral infection Hyperlipidemia Diabetes mellitus type 2   Plan: Family made decision for comfort care only.  All focus is on comfort.    Disposition Plan:   Comfort care only with anticipated hospital death.  Family Communication:   Updated her daughter via phone on 08-12-2019.  Consultants:  Cardiology, PCCM, Palliative care team  Code Status:  DNR/Comfort care Only.   Objective: Vitals:   Aug 12, 2019 0855 Aug 12, 2019 0900 August 12, 2019 1000 12-Aug-2019 1030  BP: (!) 109/37 (!) 128/47 (!) 126/34 (!) 93/33  Pulse: (!) 39 (!) 38 (!) 35 (!) 37  Resp: 18 (!) 27 (!) 27 15  Temp:      TempSrc:      SpO2: 100% 100% 100% 100%  Weight:      Height:        Intake/Output Summary (Last 24 hours) at Aug 12, 2019 1151 Last data filed at 08-12-2019 0300 Gross per 24 hour  Intake 2511.99 ml  Output 750 ml  Net 1761.99 ml   Filed Weights   07/27/19 0500 07/27/19 2146 Aug 16, 2019 0507  Weight: 75.3 kg 76.7 kg 82.2 kg    Exam:  . General: 86 y.o. year-old female Minimally responsive.   . Cardiovascular: Bradycardic with no rubs or gallops. Marland Kitchen Respiratory: Diffuse rales bilaterally no wheezing noted.   . Abdomen: Soft obese  with hypoactive bowel sounds. . Musculoskeletal: Trace lower extremity edema bilaterally.     Data Reviewed: CBC: Recent Labs  Lab 07/24/19 0333 07/25/19 0804 07/28/19 0729 07/29/19 0501 08-16-19 0645  WBC 8.6 9.6 10.6* 13.9* 15.3*  NEUTROABS 5.3 7.4 7.0  --   --   HGB 10.5* 10.7* 10.3* 9.6* 8.8*  HCT 33.2* 34.5* 30.5* 29.0* 28.1*  MCV 86.5 89.8 83.1 85.3 89.5  PLT 402* 372 233 222 [id number]*   Basic Metabolic Panel: Recent Labs  Lab 07/24/19 0333 07/25/19 0804 07/28/19 0729 07/28/19 1749 07/29/19 0501 07/29/19 1040 07/29/19 1458 07/29/19 1824 07/29/19 2101 08/16/19 0101 August 16, 2019 0645  NA 127* 124* 118* 119*  121* 122* 120* 122* 121* 122* 122* 125*  K 4.8 5.1 5.2* 5.6* 4.7 4.5  --   --   --   --  4.7  CL 96* 95* 90* 90* 87* 85*  --   --   --   --  86*  CO2 17* 16* 13* 15* 14* 14*  --   --   --   --  10*  GLUCOSE 180* 194* 253* 206* 205* 199*  --   --   --   --  128*  BUN 26* 20 39* 43* 45* 47*  --   --   --   --  53*  CREATININE 1.81* 1.42* 2.37* 2.37* 2.44* 2.62*  --   --   --   --  3.32*  CALCIUM 8.5* 8.4* 8.2* 8.0* 8.1* 7.9*  --   --   --   --  7.5*  MG 1.8 1.8 2.1  --   --   --   --   --   --   --   --   PHOS 3.1  --  3.4  --   --   --   --   --   --   --   --    GFR: Estimated Creatinine Clearance: 12.5 mL/min (A) (by C-G formula based on SCr of 3.32 mg/dL (H)). Liver Function Tests: Recent Labs  Lab 07/24/19 0333 07/25/19 0804 07/28/19 0729 07/29/19 0501 08-16-2019 0645  AST 21 31 286* 322* 705*  ALT 11 14 138* 192* 352*  ALKPHOS 70 88 102 94 81  BILITOT 0.9 1.2 1.8* 1.8* 2.4*  PROT 6.9 6.7 6.6 6.2* 6.1*  ALBUMIN 3.1* 3.1* 3.2* 2.9* 2.8*   No results for input(s): LIPASE, AMYLASE in the last 168 hours. No results for input(s): AMMONIA in the last 168 hours. Coagulation Profile: Recent Labs  Lab 07/26/19 0810 07/28/19 0831 07/29/19 1040 07/29/19 1458 08/16/19 0645  INR 2.7* 1.7* 2.1* 2.0* 2.4*   Cardiac Enzymes: No results for input(s):  CKTOTAL, CKMB, CKMBINDEX, TROPONINI in the last 168 hours. BNP (last 3 results) No results for input(s): PROBNP in the last 8760 hours. HbA1C: No results for input(s): HGBA1C in the last 72 hours. CBG: Recent Labs  Lab 07/29/19 1636 07/29/19 2114 08-16-19 0454 08/16/19 0808 08/16/19 0942  GLUCAP 161* 145* 118* 120* 98   Lipid Profile: No results for input(s): CHOL, HDL, LDLCALC,  TRIG, CHOLHDL, LDLDIRECT in the last 72 hours. Thyroid Function Tests: No results for input(s): TSH, T4TOTAL, FREET4, T3FREE, THYROIDAB in the last 72 hours. Anemia Panel: No results for input(s): VITAMINB12, FOLATE, FERRITIN, TIBC, IRON, RETICCTPCT in the last 72 hours. Urine analysis:    Component Value Date/Time   COLORURINE YELLOW 04/23/2017 0049   APPEARANCEUR HAZY (A) 04/23/2017 0049   LABSPEC 1.021 04/23/2017 0049   PHURINE 5.0 04/23/2017 0049   GLUCOSEU NEGATIVE 04/23/2017 0049   GLUCOSEU NEG mg/dL 07/06/2009 1903   HGBUR MODERATE (A) 04/23/2017 0049   BILIRUBINUR NEGATIVE 04/23/2017 0049   KETONESUR NEGATIVE 04/23/2017 0049   PROTEINUR 100 (A) 04/23/2017 0049   UROBILINOGEN 1 07/06/2009 1903   NITRITE NEGATIVE 04/23/2017 0049   LEUKOCYTESUR NEGATIVE 04/23/2017 0049   Sepsis Labs: @LABRCNTIP (procalcitonin:4,lacticidven:4)  ) Recent Results (from the past 240 hour(s))  Respiratory Panel by RT PCR (Flu A&B, Covid) - Nasopharyngeal Swab     Status: None   Collection Time: 08/14/2019  8:42 AM   Specimen: Nasopharyngeal Swab  Result Value Ref Range Status   SARS Coronavirus 2 by RT PCR NEGATIVE NEGATIVE Final    Comment: (NOTE) SARS-CoV-2 target nucleic acids are NOT DETECTED. The SARS-CoV-2 RNA is generally detectable in upper respiratoy specimens during the acute phase of infection. The lowest concentration of SARS-CoV-2 viral copies this assay can detect is 131 copies/mL. A negative result does not preclude SARS-Cov-2 infection and should not be used as the sole basis for treatment  or other patient management decisions. A negative result may occur with  improper specimen collection/handling, submission of specimen other than nasopharyngeal swab, presence of viral mutation(s) within the areas targeted by this assay, and inadequate number of viral copies (<131 copies/mL). A negative result must be combined with clinical observations, patient history, and epidemiological information. The expected result is Negative. Fact Sheet for Patients:  PinkCheek.be Fact Sheet for Healthcare Providers:  GravelBags.it This test is not yet ap proved or cleared by the [place] FDA and  has been authorized for detection and/or diagnosis of SARS-CoV-2 by FDA under an Emergency Use Authorization (EUA). This EUA will remain  in effect (meaning this test can be used) for the duration of the COVID-19 declaration under Section 564(b)(1) of the Act, 21 U.S.C. section 360bbb-3(b)(1), unless the authorization is terminated or revoked sooner.    Influenza A by PCR NEGATIVE NEGATIVE Final   Influenza B by PCR NEGATIVE NEGATIVE Final    Comment: (NOTE) The Xpert Xpress SARS-CoV-2/FLU/RSV assay is intended as an aid in  the diagnosis of influenza from Nasopharyngeal swab specimens and  should not be used as a sole basis for treatment. Nasal washings and  aspirates are unacceptable for Xpert Xpress SARS-CoV-2/FLU/RSV  testing. Fact Sheet for Patients: PinkCheek.be Fact Sheet for Healthcare Providers: GravelBags.it This test is not yet approved or cleared by the [place] FDA and  has been authorized for detection and/or diagnosis of SARS-CoV-2 by  FDA under an Emergency Use Authorization (EUA). This EUA will remain  in effect (meaning this test can be used) for the duration of the  Covid-19 declaration under Section 564(b)(1) of the Act, 21  U.S.C. section  360bbb-3(b)(1), unless the authorization is  terminated or revoked. Performed at [facility], [address] [place], [place] [address]       Studies: US RENAL  Result Date: 07/29/2019 CLINICAL DATA:  Acute kidney injury EXAM: RENAL / URINARY TRACT ULTRASOUND COMPLETE COMPARISON:  None. FINDINGS: Right Kidney: Renal measurements: 9.1  x 4.6 x 4.6 = volume: 98.9 mL . Echogenicity within normal limits. No mass or hydronephrosis visualized. Left Kidney: Renal measurements: 10.3 x 6 x 4.8 cm = volume: 153 mL. Echogenicity within normal limits. No mass or hydronephrosis visualized. Bladder: Appears normal for degree of bladder distention. Other: Incidentally noted is a large right-sided pleural effusion. IMPRESSION: 1. No hydronephrosis. 2. Incidentally noted large right-sided pleural effusion. Electronically Signed   By: Constance Holster M.D.   On: 07/29/2019 18:58   DG CHEST PORT 1 VIEW  Result Date: 08-07-2019 CLINICAL DATA:  Initial evaluation for acute shortness of breath. EXAM: PORTABLE CHEST 1 VIEW COMPARISON:  Prior radiograph from 07/20/2019. FINDINGS: Mild cardiomegaly, stable. Mediastinal silhouette within normal limits. Aortic atherosclerosis. Lungs normally inflated. Veiling opacity overlying the hemidiaphragms bilaterally consistent with small layering effusions. Superimposed patchy and streaky bibasilar opacities favored to reflect atelectasis, although infiltrates could be considered in the correct clinical setting. Mild perihilar vascular congestion without overt pulmonary edema. No pneumothorax. No acute osseous finding. IMPRESSION: 1. Veiling opacities overlying the hemidiaphragms bilaterally, consistent with small layering bilateral pleural effusions. 2. Superimposed patchy and streaky bibasilar opacities, favored to reflect atelectasis, although infiltrates could be considered in the correct clinical setting. 3. Stable cardiomegaly with mild perihilar vascular congestion without overt  pulmonary edema. 4.  Aortic Atherosclerosis (ICD10-I70.0). Electronically Signed   By: Jeannine Boga M.D.   On: 07-Aug-2019 00:02   ECHOCARDIOGRAM LIMITED  Result Date: 2019-08-07   ECHOCARDIOGRAM LIMITED REPORT   Patient Name:   JARYAH LAPPIN Date of Exam: 07-Aug-2019 Medical Rec #:  [id number]    Height:       62.0 in Accession #:    [id number]   Weight:       181.2 lb Date of Birth:  1933/02/13    BSA:          1.83 m Patient Age:    70 years     BP:           137/51 mmHg Patient Gender: F            HR:           43 bpm. Exam Location:  Inpatient  Procedure: Cardiac Doppler, Limited Echo and Limited Color Doppler Indications:    R00.1 Bradycardia, unspecified; R94.31 Abnormal EKG  History:        Patient has prior history of Echocardiogram examinations. Covid                 19 positive. Bradycardia.  Sonographer:    Roseanna Rainbow RDCS Referring Phys: [id number] [place]  1. Left ventricular ejection fraction, by visual estimation, is 65 to 70%. The left ventricle has normal function. There is mildly increased left ventricular wall thickness.  2. The left ventricle has no regional wall motion abnormalities.  3. Global right ventricle has severely reduced systolic function.The right ventricular size is severely enlarged. no increase in right ventricular wall thickness.  4. Right atrial size was severely dilated.  5. Trivial pericardial effusion is present.  6. The mitral valve is normal in structure. Mild mitral valve regurgitation.  7. Tricuspid valve regurgitation is torrential.  8. Mildly elevated pulmonary artery systolic pressure.  9. The inferior vena cava is dilated in size with <50% respiratory variability, suggesting right atrial pressure of at least 15 mmHg (probably higher) 10. There are echo findings of severely reduced cardiac output, due to right heart failure and wide-open tricuspid insufficiency. FINDINGS  Left Ventricle:  Left ventricular ejection fraction, by visual estimation, is  65 to 70%. The left ventricle has normal function. The left ventricle has no regional wall motion abnormalities. The left ventricular internal cavity size was the left ventricle is normal in size. There is mildly increased left ventricular wall thickness. Concentric left ventricular hypertrophy. Right Ventricle: The right ventricular size is severely enlarged. No increase in right ventricular wall thickness. Global RV systolic function is has severely reduced systolic function. The tricuspid regurgitant velocity is 2.41 m/s, and with an assumed right atrial pressure of 15 mmHg, the estimated right ventricular systolic pressure is mildly elevated at 38.2 mmHg. Left Atrium: Left atrial size was normal in size. Right Atrium: Right atrial size was severely dilated. Right atrial pressure is estimated at 15 mmHg. Pericardium: Trivial pericardial effusion is present is seen. Trivial pericardial effusion is present. There is pleural effusion in both left and right lateral regions. Mitral Valve: The mitral valve is normal in structure. MV Area by PHT, 4.96 cm. MV PHT, 44.37 msec. Mild mitral valve regurgitation, with centrally-directed jet. Tricuspid Valve: Tricuspid valve regurgitation is torrential. Aortic Valve: The aortic valve is normal in structure. Aortic valve regurgitation is trivial. Aorta: The aortic root is normal in size and structure. Venous: The inferior vena cava is dilated in size with less than 50% respiratory variability, suggesting right atrial pressure of 15 mmHg. Shunts: No atrial level shunt detected by color flow Doppler.  LEFT VENTRICLE          Normals PLAX 2D LVIDd:         2.91 cm  3.6 cm LVIDs:         1.90 cm  1.7 cm LV PW:         1.20 cm  1.4 cm LV IVS:        1.20 cm  1.3 cm LVOT diam:     1.60 cm  2.0 cm LV SV:         21 ml    79 ml LV SV Index:   11.04    45 ml/m2 LVOT Area:     2.01 cm 3.14 cm2  IVC IVC diam: 2.31 cm LEFT ATRIUM         Index      RIGHT ATRIUM           Index LA diam:     3.20 cm 1.75 cm/m RA Area:     27.10 cm                                RA Volume:   106.00 ml 57.82 ml/m  AORTIC VALVE             Normals LVOT Vmax:   87.30 cm/s LVOT Vmean:  49.500 cm/s 75 cm/s LVOT VTI:    0.141 m     25.3 cm  AORTA                 Normals Ao Root diam: 2.70 cm 31 mm MITRAL VALVE              Normals   TRICUSPID VALVE             Normals MV Area (PHT): 4.96 cm             TR Peak grad:   23.2 mmHg MV PHT:        44.37 msec 55 ms  TR Vmax:        241.00 cm/s 288 cm/s MV Decel Time: 153 msec   187 ms MV E velocity: 117.00 cm/s 103 cm/s SHUNTS                                     Systemic VTI:  0.14 m                                     Systemic Diam: 1.60 cm  Mihai Croitoru MD Electronically signed by Sanda Klein MD Signature Date/Time: 08/26/2019/11:26:25 AM    Final     Scheduled Meds: . midazolam      .  morphine injection  4 mg Intravenous Once    Continuous Infusions:    LOS: 8 days     Kayleen Memos, MD [facility] Pager [phone]  If 7PM-7AM, please contact night-coverage [email or web address] Password [facility] 08-26-19, 11:51 AM

## 2019-08-19 NOTE — Progress Notes (Signed)
SLP Cancellation Note  Patient Details Name: Katrina Arnold MRN: [id number] DOB: Dec 22, 1932   Cancelled treatment:       Reason Eval/Treat Not Completed: Medical issues which prohibited therapy. Procedure canceled on previous date; pt now NPO today in anticipation of possible procedure. Will f/u as able for swallow evaluation.     Osie Bond., M.A. [facility] Pager [phone] Office [phone]  16-Aug-2019, 7:18 AM

## 2019-08-19 NOTE — Progress Notes (Signed)
Palliative Medicine RN Note: Received consult order from Dr Nevada Crane to assist with hospice care, possible hospital death. Patient remains in the ICU following decompensation this morning ("cardiogenic shock") and transition to comfort care.  Assessment was completed via face to face meeting with patient RN to conserve PPE during pandemic. I spoke with patient's nurse who described guppy breathing and reports that the current morphine orders do not seem to be adequate for Katrina Arnold's needs. Of note, Katrina Arnold's husband died at [facility], and his funeral is this Saturday, Jan 16; family has not told Katrina Arnold.  I called PMT Medical Director Dr Hilma Favors to discuss patient's status and current orders. Dr Hilma Favors changed the morphine to a low-dose hydromorphone infusion due to diagnosis of CKD. She also initiated lorazepam prn to help prevent anxiety related to air hunger and the dying process.   I called patient's daughter Butch Penny to give an update and introduce our team as a tool to help keep Katrina Arnold comfortable. Butch Penny had a lot of questions about visitation and isolation status that I could not answer, so I made calls to hospices and the nursing station to clarify visitation. At family's request, I reached out to Drs Buren Kos, and Hilma Favors, as well as Infection Prevention, about isolation precautions, as tomorrow will be 3 weeks since her positive test (12/23). The team discussed making a hospice referral, and Dr Nevada Crane will follow guideline and take her off precautions tomorrow.  I called Butch Penny back to update her. She expressed regret that the family had opted not to pursue heart cath when offered earlier in the admission, and she is also frustrated at the course of her mother's admission. Butch Penny expressed that the family would like to stop comfort measures, restart her mother's regular medications and bring her home. I repeated that statement back to her to ensure I understood, and Butch Penny confirmed. I asked about code  status and whether she would want [place] to proceed with intubation and chest compressions, but I did not hear a clear answer. I explained that our team is not a part of that type of care, so I will reach out to Katrina Arnold's attending MD, Dr Nevada Crane, and she will likely call. Butch Penny expressed understanding.  During these conversations, contact with Drs Hilma Favors and Nevada Crane was frequent and in real time via secure chat.  Marjie Skiff Donovan Persley, RN, BSN, [facility] Palliative Medicine Team 08/04/2019 2:26 PM Office [phone]

## 2019-08-19 NOTE — Care Management (Signed)
ED CM received call from Dr. Donovan Kail of [facility], concerning family goal to transition patient home with hospice services for comfort care. The request was made to fax information to [place] and [facility] first available to provide care. CM faxed referral and called the On Call Nurse Lysbeth Galas [phone] at [facility] who will handoff referral in the am.  ED CM placed update on handoff for [facility] team to follow up in the am.

## 2019-08-19 NOTE — Significant Event (Signed)
Rapid Response Event Note  Overview: Time Called: [id number] Arrival Time: 0850 Event Type: Cardiac, Neurologic  Initial Focused Assessment: Patient bradycardic and hypothermic. Difficult to arouse HR 34  BP 128/47  RR 26  O2 sat 100% on 4L [place]  Interventions: Dopamine started at 83mcg/kg Zoll pads placed 2 amps bicarb given IV Patient easier to arouse.  No change in HR  BP 112/39  Transported to 3M04 RN at bedside to receive patient.  Plan of Care (if not transferred):  Event Summary: Name of Physician Notified: Dr Nevada Crane at bedside at    Name of Consulting Physician Notified: Dr Nelda Marseille at [phone]  Outcome: Transferred (Comment)  Event End Time: 0945  Raliegh Ip

## 2019-08-19 NOTE — Discharge Summary (Addendum)
Discharge Summary/Death Summary  Katrina Arnold [id number] DOB: Nov 16, 1932  PCP: Rosita Fire, MD  Admit date: 08/07/2019 Discharge date: 08-27-2019  Time spent: 35 minutes  Recommendations for Outpatient Follow-up:  1. Follow up with hospice care  Discharge Diagnoses:  Active Hospital Problems   Diagnosis Date Noted  . Atrial fibrillation ([place]) 01/05/2009  . COVID-19 virus infection 2019-08-27  . DNR (do not resuscitate) 08-27-2019  . Comfort measures only status August 27, 2019  . Hypotension 08-27-2019  . Hypothermia 08-27-19  . Acute metabolic encephalopathy [id number]  . Angina pectoris ([place]) 07/23/2019  . CKD (chronic kidney disease) stage 3, GFR 30-59 ml/min 07/23/2019  . Bradycardia   . Acute respiratory disease due to COVID-19 virus 08/10/2019  . Type 2 diabetes mellitus with stage 3 chronic kidney disease ([place]) 07/19/2019  . Chronic anticoagulation 10/13/2010  . CEREBROVASCULAR DISEASE 11/17/2009  . Hypertension 01/05/2009    Resolved Hospital Problems  No resolved problems to display.    Vitals:   27-Aug-2019 1600 Aug 27, 2019 1700  BP: (!) 114/47 (!) 114/38  Pulse: (!) 36 (!) 36  Resp: 14 14  Temp:    SpO2: 100% 100%    History of present illness:  86 year old female with a history of chronic atrial fibrillation on warfarin, hypertension, hyperlipidemia, diabetes mellitus type 2 presenting with chest discomfort and shortness of breath.  She had some associated nausea and emesis.  Diagnosed with COVID-19 on 07/10/2019 and her husband is currently admitted to [facility] 2 days prior to this admission.  Found to have atrial fibrillation with slow ventricular response with heart rate as low as 30s.  Cardiology was consulted to assist with management. Troponins were elevated with a peak of 578. There was concern for ACS.  Cardiology requested transfer [facility] to [facility] for possible heart cath.  Arrived at [facility] the  evening/early morning of 07/28/19.  Heart cath delayed due to worsening renal function for which nephrology was consulted.  Also had some electrolyte abnormalities with hypovolemic hyponatremia, high anion gap metabolic acidosis in the setting of renal failure.  Acute transaminitis with worsening up-trending AST and ALT.  In the morning of 2019-08-27 patient was minimally responsive with HR in the low 30's and hypotensive with MAP in the mid 50's.  Received stat atropine 0.2 mg x 2.  Bear hugger applied for rectal temp 93.9.  Received 2 amps of bicarb for serum bicarb of 10.  Discussed with cardiology Dr. Johnsie Cancel at bedside.  Ordered dopamine drip, stat 2D echo and transferred to ICU to continue pressors.  Discussed with PCCM, Dr. Nelda Marseille accepted the patient.  While in the ICU due to multiorgan systems failure and poor prognosis, family made decision for DNR and comfort care only.  Dopamine dc'ed and focus is all on comfort.  Katrina Arnold and Katrina Arnold, patient's children confirmed her code status and comfort care status via phone on 08-27-19.  Per Katrina Arnold, this was a group decision with family members.  Palliative care team assisting with hospice care needs.  Hospital Course:  Principal Problem:   Atrial fibrillation [facility]) Active Problems:   Hypertension   CEREBROVASCULAR DISEASE   Chronic anticoagulation   Acute respiratory disease due to COVID-19 virus   Type 2 diabetes mellitus with stage 3 chronic kidney disease (HCC)   Angina pectoris (HCC)   CKD (chronic kidney disease) stage 3, GFR 30-59 ml/min   Bradycardia   COVID-19 virus infection   DNR (do not resuscitate)   Comfort measures  only status   Hypotension   Hypothermia   Acute metabolic encephalopathy  Assessment: Elevated troponin/Angina Pectoris Acute metabolic encephalopathy Worsening AKI on CKD 3 Hypothermia Bradycardia Hyperkalemia Hypotension in the setting of history of essential hypertension High anion gap metabolic acidosis in the setting  of worsening renal failure Severe hyponatremia Transaminitis Persistent atrial fibrillation with slow ventricular response COVID-19 viral infection Hyperlipidemia Diabetes mellitus type 2 Chronic anticoagulation Acute blood loss anemia Acute thrombocytopenia Leukocytosis   Plan: Family made decision for comfort care only.  All focus is on comfort.  Consultants: Cardiology, PCCM, Palliative care team  Code Status:  DNR/Comfort care Only.   Patient expired on 07/29/21 at 2322.   Discharge Exam: BP (!) 114/38   Pulse (!) 36   Temp (!) 93.7 F (34.3 C) (Rectal)   Resp 14   Ht 5\' 2"  (1.575 m)   Wt 82.2 kg   SpO2 100%   BMI 33.15 kg/m  . General: 86 y.o. year-old female  Non responsive. . Cardiovascular: Bradycardic . Respiratory: Mild diffused rales bilaterally. . Abdomen: Hypoactive bowel sounds . Musculoskeletal: Trace lower extremity edema bilaterally. Marland Kitchen Psychiatry: Unable to assess due to unresponsiveness.  Discharge Instructions You were cared for by a hospitalist during your hospital stay. If you have any questions about your discharge medications or the care you received while you were in the hospital after you are discharged, you can call the unit and asked to speak with the hospitalist on call if the hospitalist that took care of you is not available. Once you are discharged, your primary care physician will handle any further medical issues. Please note that NO REFILLS for any discharge medications will be authorized once you are discharged, as it is imperative that you return to your primary care physician (or establish a relationship with a primary care physician if you do not have one) for your aftercare needs so that they can reassess your need for medications and monitor your lab values.   Allergies as of 08/17/19   No Known Allergies     Medication List    STOP taking these medications   amLODipine 5 MG tablet Commonly known as: NORVASC     glipiZIDE 2.5 MG 24 hr tablet Commonly known as: GLUCOTROL XL   hydrochlorothiazide 12.5 MG capsule Commonly known as: MICROZIDE   metFORMIN 500 MG tablet Commonly known as: GLUCOPHAGE   metoprolol tartrate 50 MG tablet Commonly known as: LOPRESSOR   omeprazole 20 MG capsule Commonly known as: PRILOSEC   potassium chloride SA 20 MEQ tablet Commonly known as: KLOR-CON   simvastatin 20 MG tablet Commonly known as: ZOCOR   warfarin 5 MG tablet Commonly known as: COUMADIN     TAKE these medications   haloperidol 2 MG/ML solution Commonly known as: HALDOL Take 1 mL (2 mg total) by mouth every 6 (six) hours as needed for agitation (or delirium).   HYDROmorphone HCl 1 MG/ML Liqd Commonly known as: DILAUDID Take 2 mLs (2 mg total) by mouth every 4 (four) hours as needed for severe pain (shortness of breath).      No Known Allergies    The results of significant diagnostics from this hospitalization (including imaging, microbiology, ancillary and laboratory) are listed below for reference.    Significant Diagnostic Studies: US RENAL  Result Date: 07/29/2019 CLINICAL DATA:  Acute kidney injury EXAM: RENAL / URINARY TRACT ULTRASOUND COMPLETE COMPARISON:  None. FINDINGS: Right Kidney: Renal measurements: 9.1 x 4.6 x 4.6 = volume: 98.9 mL .  Echogenicity within normal limits. No mass or hydronephrosis visualized. Left Kidney: Renal measurements: 10.3 x 6 x 4.8 cm = volume: 153 mL. Echogenicity within normal limits. No mass or hydronephrosis visualized. Bladder: Appears normal for degree of bladder distention. Other: Incidentally noted is a large right-sided pleural effusion. IMPRESSION: 1. No hydronephrosis. 2. Incidentally noted large right-sided pleural effusion. Electronically Signed   By: Constance Holster M.D.   On: 07/29/2019 18:58   DG CHEST PORT 1 VIEW  Result Date: August 16, 2019 CLINICAL DATA:  Initial evaluation for acute shortness of breath. EXAM: PORTABLE CHEST 1 VIEW  COMPARISON:  Prior radiograph from 08/16/2019. FINDINGS: Mild cardiomegaly, stable. Mediastinal silhouette within normal limits. Aortic atherosclerosis. Lungs normally inflated. Veiling opacity overlying the hemidiaphragms bilaterally consistent with small layering effusions. Superimposed patchy and streaky bibasilar opacities favored to reflect atelectasis, although infiltrates could be considered in the correct clinical setting. Mild perihilar vascular congestion without overt pulmonary edema. No pneumothorax. No acute osseous finding. IMPRESSION: 1. Veiling opacities overlying the hemidiaphragms bilaterally, consistent with small layering bilateral pleural effusions. 2. Superimposed patchy and streaky bibasilar opacities, favored to reflect atelectasis, although infiltrates could be considered in the correct clinical setting. 3. Stable cardiomegaly with mild perihilar vascular congestion without overt pulmonary edema. 4.  Aortic Atherosclerosis (ICD10-I70.0). Electronically Signed   By: Jeannine Boga M.D.   On: August 16, 2019 00:02   DG Chest Port 1 View  Result Date: 08/16/2019 CLINICAL DATA:  Vomiting and shortness of breath. EXAM: PORTABLE CHEST 1 VIEW COMPARISON:  01/09/2008 FINDINGS: Normal heart size and mediastinal contours. There is no edema, consolidation, effusion, or pneumothorax. No osseous findings. IMPRESSION: No evidence of active disease. Electronically Signed   By: Monte Fantasia M.D.   On: 07/27/2019 06:03   ECHOCARDIOGRAM COMPLETE  Result Date: 07/28/2019   ECHOCARDIOGRAM REPORT   Patient Name:   Katrina Arnold Date of Exam: 07/29/2019 Medical Rec #:  [id number]    Height:       62.0 in Accession #:    [id number]   Weight:       162.0 lb Date of Birth:  November 02, 1932    BSA:          1.75 m Patient Age:    89 years     BP:           144/67 mmHg Patient Gender: F            HR:           40 bpm. Exam Location:  [facility] Procedure: 2D Echo, Cardiac Doppler and Color Doppler Indications:     Chest Pain 786.50 / R07.9  History:        Patient has prior history of Echocardiogram examinations, most                 recent 01/14/2008. Arrythmias:Atrial Fibrillation; Risk                 Factors:Hypertension, Diabetes and Dyslipidemia. Chronic                 anticoagulation,Hx of Adenocarcinoma of colon.  Sonographer:    BW Referring Phys: [place]  1. Left ventricular ejection fraction, by visual estimation, is 60 to 65%. The left ventricle has normal function. There is mildly increased left ventricular hypertrophy.  2. Left ventricular diastolic parameters are indeterminate.  3. The left ventricle demonstrates regional wall motion abnormalities. Apparent inferior basal akinesis.  4. Global right ventricle has normal systolic  function.The right ventricular size is normal. No increase in right ventricular wall thickness.  5. Left atrial size was severely dilated.  6. Right atrial size was moderately dilated.  7. The mitral valve is grossly normal. Moderate mitral valve regurgitation.  8. The tricuspid valve is grossly normal.  9. The aortic valve is tricuspid. Aortic valve regurgitation is mild. 10. The pulmonic valve was grossly normal. Pulmonic valve regurgitation is trivial. 11. Moderately elevated pulmonary artery systolic pressure. 12. The inferior vena cava is normal in size with greater than 50% respiratory variability, suggesting right atrial pressure of 3 mmHg. 13. Evidence of atrial level shunting detected by color flow Doppler. 14. The tricuspid regurgitant velocity is 3.23 m/s, and with an assumed right atrial pressure of 3 mmHg, the estimated right ventricular systolic pressure is moderately elevated at 44.7 mmHg. FINDINGS  Left Ventricle: Left ventricular ejection fraction, by visual estimation, is 60 to 65%. The left ventricle has normal function. The left ventricle demonstrates regional wall motion abnormalities. The left ventricular internal cavity size was the left  ventricle is normal in size. There is mildly increased left ventricular hypertrophy. Left ventricular diastolic parameters are indeterminate. Right Ventricle: The right ventricular size is normal. No increase in right ventricular wall thickness. Global RV systolic function is has normal systolic function. The tricuspid regurgitant velocity is 3.23 m/s, and with an assumed right atrial pressure  of 3 mmHg, the estimated right ventricular systolic pressure is moderately elevated at 44.7 mmHg. Left Atrium: Left atrial size was severely dilated. Right Atrium: Right atrial size was moderately dilated Pericardium: There is no evidence of pericardial effusion. Mitral Valve: The mitral valve is grossly normal. Moderate mitral valve regurgitation. Tricuspid Valve: The tricuspid valve is grossly normal. Tricuspid valve regurgitation moderate. Aortic Valve: The aortic valve is tricuspid. Aortic valve regurgitation is mild. Mild aortic valve annular calcification. Pulmonic Valve: The pulmonic valve was grossly normal. Pulmonic valve regurgitation is trivial. Pulmonic regurgitation is trivial. Aorta: The aortic root is normal in size and structure. Venous: The inferior vena cava is normal in size with greater than 50% respiratory variability, suggesting right atrial pressure of 3 mmHg. IAS/Shunts: Evidence of atrial level shunting detected by color flow Doppler.  LEFT VENTRICLE PLAX 2D LVIDd:         3.36 cm LVIDs:         1.95 cm LV PW:         1.02 cm LV IVS:        1.13 cm LVOT diam:     1.60 cm LV SV:         34 ml LV SV Index:   18.79 LVOT Area:     2.01 cm  RIGHT VENTRICLE TAPSE (M-mode): 1.9 cm LEFT ATRIUM             Index       RIGHT ATRIUM           Index LA diam:        3.30 cm 1.89 cm/m  RA Area:     24.00 cm LA Vol (A2C):   78.6 ml 44.97 ml/m RA Volume:   79.80 ml  45.65 ml/m LA Vol (A4C):   95.1 ml 54.41 ml/m LA Biplane Vol: 88.4 ml 50.57 ml/m  AORTIC VALVE LVOT Vmax:   86.15 cm/s LVOT Vmean:  60.850 cm/s  LVOT VTI:    0.211 m  AORTA Ao Root diam: 3.30 cm MITRAL VALVE  TRICUSPID VALVE MV Area (PHT): 3.31 cm              TR Peak grad:   41.7 mmHg MV PHT:        66.41 msec            TR Vmax:        323.00 cm/s MV Decel Time: 229 msec MV E velocity: 117.00 cm/s 103 cm/s  SHUNTS MV A velocity: 21.40 cm/s  70.3 cm/s Systemic VTI:  0.21 m MV E/A ratio:  5.47        1.5       Systemic Diam: 1.60 cm  Rozann Lesches MD Electronically signed by Rozann Lesches MD Signature Date/Time: 08/01/2019/2:50:08 PM    Final    ECHOCARDIOGRAM LIMITED  Result Date: 2019-08-08   ECHOCARDIOGRAM LIMITED REPORT   Patient Name:   Katrina Arnold Date of Exam: 2019/08/08 Medical Rec #:  [id number]    Height:       62.0 in Accession #:    [id number]   Weight:       181.2 lb Date of Birth:  [birth year]    BSA:          1.83 m Patient Age:    36 years     BP:           137/51 mmHg Patient Gender: F            HR:           43 bpm. Exam Location:  Inpatient  Procedure: Cardiac Doppler, Limited Echo and Limited Color Doppler Indications:    R00.1 Bradycardia, unspecified; R94.31 Abnormal EKG  History:        Patient has prior history of Echocardiogram examinations. Covid                 19 positive. Bradycardia.  Sonographer:    Roseanna Rainbow RDCS Referring Phys: [id number] [place]  1. Left ventricular ejection fraction, by visual estimation, is 65 to 70%. The left ventricle has normal function. There is mildly increased left ventricular wall thickness.  2. The left ventricle has no regional wall motion abnormalities.  3. Global right ventricle has severely reduced systolic function.The right ventricular size is severely enlarged. no increase in right ventricular wall thickness.  4. Right atrial size was severely dilated.  5. Trivial pericardial effusion is present.  6. The mitral valve is normal in structure. Mild mitral valve regurgitation.  7. Tricuspid valve regurgitation is torrential.  8. Mildly elevated  pulmonary artery systolic pressure.  9. The inferior vena cava is dilated in size with <50% respiratory variability, suggesting right atrial pressure of at least 15 mmHg (probably higher) 10. There are echo findings of severely reduced cardiac output, due to right heart failure and wide-open tricuspid insufficiency. FINDINGS  Left Ventricle: Left ventricular ejection fraction, by visual estimation, is 65 to 70%. The left ventricle has normal function. The left ventricle has no regional wall motion abnormalities. The left ventricular internal cavity size was the left ventricle is normal in size. There is mildly increased left ventricular wall thickness. Concentric left ventricular hypertrophy. Right Ventricle: The right ventricular size is severely enlarged. No increase in right ventricular wall thickness. Global RV systolic function is has severely reduced systolic function. The tricuspid regurgitant velocity is 2.41 m/s, and with an assumed right atrial pressure of 15 mmHg, the estimated right ventricular systolic pressure is mildly elevated at 38.2 mmHg. Left Atrium: Left atrial size  was normal in size. Right Atrium: Right atrial size was severely dilated. Right atrial pressure is estimated at 15 mmHg. Pericardium: Trivial pericardial effusion is present is seen. Trivial pericardial effusion is present. There is pleural effusion in both left and right lateral regions. Mitral Valve: The mitral valve is normal in structure. MV Area by PHT, 4.96 cm. MV PHT, 44.37 msec. Mild mitral valve regurgitation, with centrally-directed jet. Tricuspid Valve: Tricuspid valve regurgitation is torrential. Aortic Valve: The aortic valve is normal in structure. Aortic valve regurgitation is trivial. Aorta: The aortic root is normal in size and structure. Venous: The inferior vena cava is dilated in size with less than 50% respiratory variability, suggesting right atrial pressure of 15 mmHg. Shunts: No atrial level shunt detected by  color flow Doppler.  LEFT VENTRICLE          Normals PLAX 2D LVIDd:         2.91 cm  3.6 cm LVIDs:         1.90 cm  1.7 cm LV PW:         1.20 cm  1.4 cm LV IVS:        1.20 cm  1.3 cm LVOT diam:     1.60 cm  2.0 cm LV SV:         21 ml    79 ml LV SV Index:   11.04    45 ml/m2 LVOT Area:     2.01 cm 3.14 cm2  IVC IVC diam: 2.31 cm LEFT ATRIUM         Index      RIGHT ATRIUM           Index LA diam:    3.20 cm 1.75 cm/m RA Area:     27.10 cm                                RA Volume:   106.00 ml 57.82 ml/m  AORTIC VALVE             Normals LVOT Vmax:   87.30 cm/s LVOT Vmean:  49.500 cm/s 75 cm/s LVOT VTI:    0.141 m     25.3 cm  AORTA                 Normals Ao Root diam: 2.70 cm 31 mm MITRAL VALVE              Normals   TRICUSPID VALVE             Normals MV Area (PHT): 4.96 cm             TR Peak grad:   23.2 mmHg MV PHT:        44.37 msec 55 ms     TR Vmax:        241.00 cm/s 288 cm/s MV Decel Time: 153 msec   187 ms MV E velocity: 117.00 cm/s 103 cm/s SHUNTS                                     Systemic VTI:  0.14 m                                     Systemic Diam: 1.60 cm  Sanda Klein MD Electronically signed by Sanda Klein MD Signature Date/Time: Aug 04, 2019/11:26:25 AM    Final     Microbiology: Recent Results (from the past 240 hour(s))  Respiratory Panel by RT PCR (Flu A&B, Covid) - Nasopharyngeal Swab     Status: None   Collection Time: 07/20/2019  8:42 AM   Specimen: Nasopharyngeal Swab  Result Value Ref Range Status   SARS Coronavirus 2 by RT PCR NEGATIVE NEGATIVE Final    Comment: (NOTE) SARS-CoV-2 target nucleic acids are NOT DETECTED. The SARS-CoV-2 RNA is generally detectable in upper respiratoy specimens during the acute phase of infection. The lowest concentration of SARS-CoV-2 viral copies this assay can detect is 131 copies/mL. A negative result does not preclude SARS-Cov-2 infection and should not be used as the sole basis for treatment or other patient management  decisions. A negative result may occur with  improper specimen collection/handling, submission of specimen other than nasopharyngeal swab, presence of viral mutation(s) within the areas targeted by this assay, and inadequate number of viral copies (<131 copies/mL). A negative result must be combined with clinical observations, patient history, and epidemiological information. The expected result is Negative. Fact Sheet for Patients:  PinkCheek.be Fact Sheet for Healthcare Providers:  GravelBags.it This test is not yet ap proved or cleared by the [place] FDA and  has been authorized for detection and/or diagnosis of SARS-CoV-2 by FDA under an Emergency Use Authorization (EUA). This EUA will remain  in effect (meaning this test can be used) for the duration of the COVID-19 declaration under Section 564(b)(1) of the Act, 21 U.S.C. section 360bbb-3(b)(1), unless the authorization is terminated or revoked sooner.    Influenza A by PCR NEGATIVE NEGATIVE Final   Influenza B by PCR NEGATIVE NEGATIVE Final    Comment: (NOTE) The Xpert Xpress SARS-CoV-2/FLU/RSV assay is intended as an aid in  the diagnosis of influenza from Nasopharyngeal swab specimens and  should not be used as a sole basis for treatment. Nasal washings and  aspirates are unacceptable for Xpert Xpress SARS-CoV-2/FLU/RSV  testing. Fact Sheet for Patients: PinkCheek.be Fact Sheet for Healthcare Providers: GravelBags.it This test is not yet approved or cleared by the [place] FDA and  has been authorized for detection and/or diagnosis of SARS-CoV-2 by  FDA under an Emergency Use Authorization (EUA). This EUA will remain  in effect (meaning this test can be used) for the duration of the  Covid-19 declaration under Section 564(b)(1) of the Act, 21  U.S.C. section 360bbb-3(b)(1), unless the authorization  is  terminated or revoked. Performed at [facility], [address] [place], [place] [address]      Labs: Basic Metabolic Panel: Recent Labs  Lab 07/24/19 [phone] 07/25/19 0804 07/28/19 0729 07/28/19 1749 07/29/19 0501 07/29/19 1040 07/29/19 1458 07/29/19 1824 07/29/19 2101 08-04-19 0101 Aug 04, 2019 0645  NA 127* 124* 118* 119*  121* 122* 120* 122* 121* 122* 122* 125*  K 4.8 5.1 5.2* 5.6* 4.7 4.5  --   --   --   --  4.7  CL 96* 95* 90* 90* 87* 85*  --   --   --   --  86*  CO2 17* 16* 13* 15* 14* 14*  --   --   --   --  10*  GLUCOSE 180* 194* 253* 206* 205* 199*  --   --   --   --  128*  BUN 26* 20 39* 43* 45* 47*  --   --   --   --  53*  CREATININE 1.81* 1.42* 2.37* 2.37* 2.44* 2.62*  --   --   --   --  3.32*  CALCIUM 8.5* 8.4* 8.2* 8.0* 8.1* 7.9*  --   --   --   --  7.5*  MG 1.8 1.8 2.1  --   --   --   --   --   --   --   --   PHOS 3.1  --  3.4  --   --   --   --   --   --   --   --    Liver Function Tests: Recent Labs  Lab 07/24/19 0333 07/25/19 0804 07/28/19 0729 07/29/19 0501 05-Aug-2019 0645  AST 21 31 286* 322* 705*  ALT 11 14 138* 192* 352*  ALKPHOS 70 88 102 94 81  BILITOT 0.9 1.2 1.8* 1.8* 2.4*  PROT 6.9 6.7 6.6 6.2* 6.1*  ALBUMIN 3.1* 3.1* 3.2* 2.9* 2.8*   No results for input(s): LIPASE, AMYLASE in the last 168 hours. No results for input(s): AMMONIA in the last 168 hours. CBC: Recent Labs  Lab 07/24/19 0333 07/25/19 0804 07/28/19 0729 07/29/19 0501 August 05, 2019 0645  WBC 8.6 9.6 10.6* 13.9* 15.3*  NEUTROABS 5.3 7.4 7.0  --   --   HGB 10.5* 10.7* 10.3* 9.6* 8.8*  HCT 33.2* 34.5* 30.5* 29.0* 28.1*  MCV 86.5 89.8 83.1 85.3 89.5  PLT 402* 372 233 222 143*   Cardiac Enzymes: No results for input(s): CKTOTAL, CKMB, CKMBINDEX, TROPONINI in the last 168 hours. BNP: BNP (last 3 results) Recent Labs    08/14/2019 0559  BNP 259.0*    ProBNP (last 3 results) No results for input(s): PROBNP in the last 8760 hours.  CBG: Recent Labs  Lab 07/29/19 1636  07/29/19 2114 08/05/2019 0454 08-05-2019 0808 08-05-19 0942  GLUCAP 161* 145* 118* 120* 98       Signed:  Kayleen Memos, MD [facility] August 05, 2019, 6:56 PM

## 2019-08-19 NOTE — CV Procedure (Signed)
2D echo attempted, but patient being transported to [facility]. RN request echo be done there.

## 2019-08-19 NOTE — Progress Notes (Signed)
Manufacturing engineer  Referral received for residential hospice at [facility].  Family then elected to proceed with full treatment.  [facility] will follow and place on waiting list, should family change their mind.  Venia Carbon RN, BSN, [facility] Liaison

## 2019-08-19 NOTE — Progress Notes (Signed)
Patient with labored breathing and diminished sounds. Cold to touch. Responsive to verbal stimulation. A&Ox0, Rectal temp 93.7, 106/44 (64), HR-34, R-21, 100% on 4L [place]. CBG-120. EKG obtianed. Charge nurse notified. MD paged orders received. ICU report given to receiving RN.

## 2019-08-19 NOTE — Progress Notes (Signed)
Echo shows no effusion and normal LV EF abnormal septal motion Mild AR/MR Severe RV failure etiology not clear  Evidence of significant pulmonary HTN and severe TR  Per CCM patient DNR and agonal  Would agree with lack of aggressive care given multi-system failure Age and patients previous wishes  Katrina Arnold

## 2019-08-19 NOTE — Progress Notes (Signed)
Spoke with daughter Butch Penny and son Truman Hayward.  They made decision to continue comfort care measures.  This was a group decision amongst themselves.

## 2019-08-19 NOTE — Progress Notes (Signed)
Patient states "I can't breathe"  VSS, 02 sats >95%. Lungs CTA. PRN inhaler given. On call asked for anti anxiety med and CXR. Patient oriented x4.

## 2019-08-19 NOTE — Death Summary Note (Addendum)
Patient passed away at 2322 08/05/19. No heart or breath sounds auscultated by Bea Graff, RN and Woodroe Chen, RN. Daughter Raquel James, MD and CDS notified. Family assisted in setting up video chat via [place]. Patient transferred to morgue with personal belongings.

## 2019-08-19 NOTE — Progress Notes (Signed)
Have conferred w/ Dr. [place] Crane, [facility], and daughter this afternoon. Have gained clarification that family wishes to continue comfort care without any escalation to aggressive care.  Have also related family's wishes to bring their mother home if there is any possibility of facilitating that. Son Jeneen Rinks is at bs with her now. Emotional support given.

## 2019-08-19 NOTE — Progress Notes (Signed)
Supporting family in goal to transition patient home with hospice care. They are actively grieving the loss of their father also the patient's husband who died recently at [facility]. They have a large loving family at want to have their mom home so they all can see her and care for her during this very difficult time. I have discussed with evening care manager-will send referrals. I will reach out to get things going tonight. She will need nasal cannula O2 at home, will need to find out if she needs a hospital bed etc- I have sent script for liquid hydromorphone and haldol to [facility] in [place]. Hospice RN must be available to admit shortly after her arrival home.  Lane Hacker, DO Palliative Medicine

## 2019-08-19 NOTE — Progress Notes (Signed)
Patient appears distressed with pursed breathing noted. 02 sat >95%. Bradycardic, however, no more than recent days.  Blood sugar stable. Rapid response nurse contacted for opinion. Patient A&O x 4. Repositioned and work of breathing eased.

## 2019-08-19 DEATH — deceased

## 2020-06-22 IMAGING — DX DG CHEST 1V PORT
1 series · 1 of 1 positions shown · non-contrast
Comparison: 01/09/2008

CLINICAL DATA: Vomiting and shortness of breath.

EXAM:
PORTABLE CHEST 1 VIEW

[chest ap]
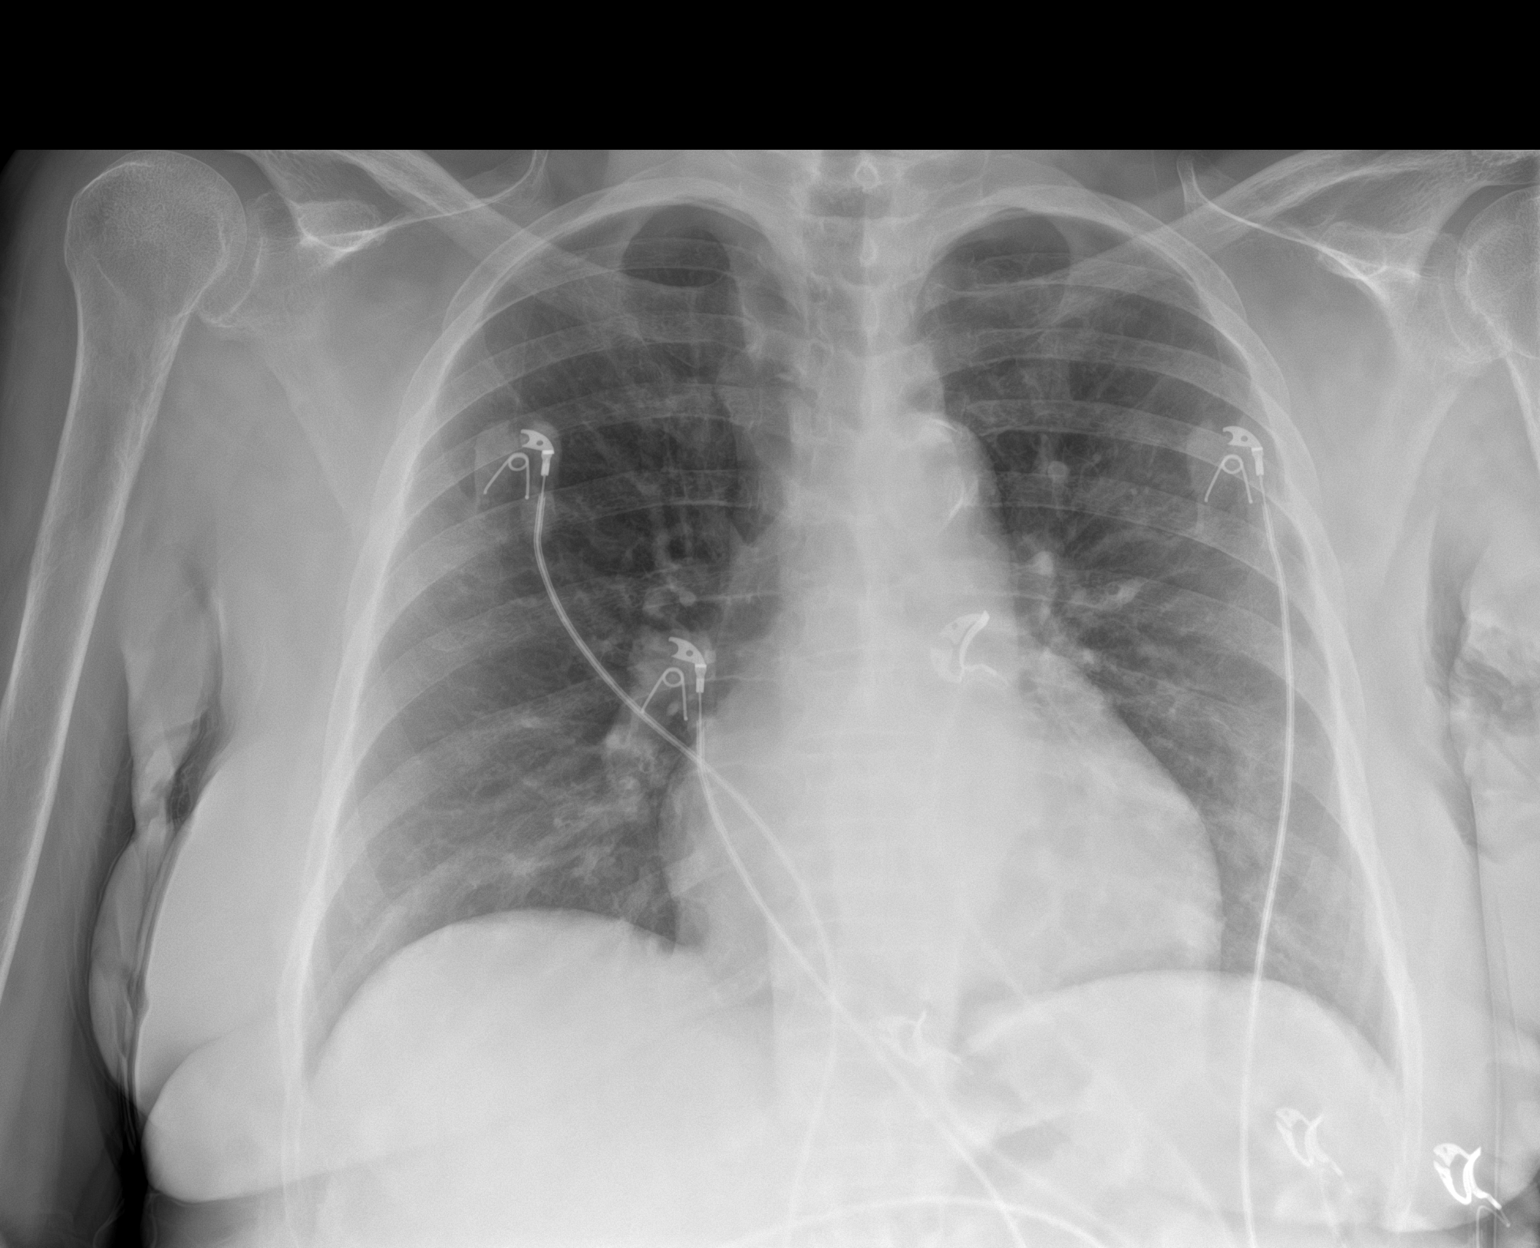

[1 of 1 positions shown; findings below may reference images not displayed]

FINDINGS: Normal heart size and mediastinal contours. There is no edema,
consolidation, effusion, or pneumothorax. No osseous findings.
IMPRESSION: No evidence of active disease.

## 2020-06-29 IMAGING — DX DG CHEST 1V PORT
1 series · 1 of 1 positions shown · non-contrast
Comparison: Prior radiograph from 07/22/2019.

CLINICAL DATA: Initial evaluation for acute shortness of breath.

EXAM:
PORTABLE CHEST 1 VIEW

[chest ap]
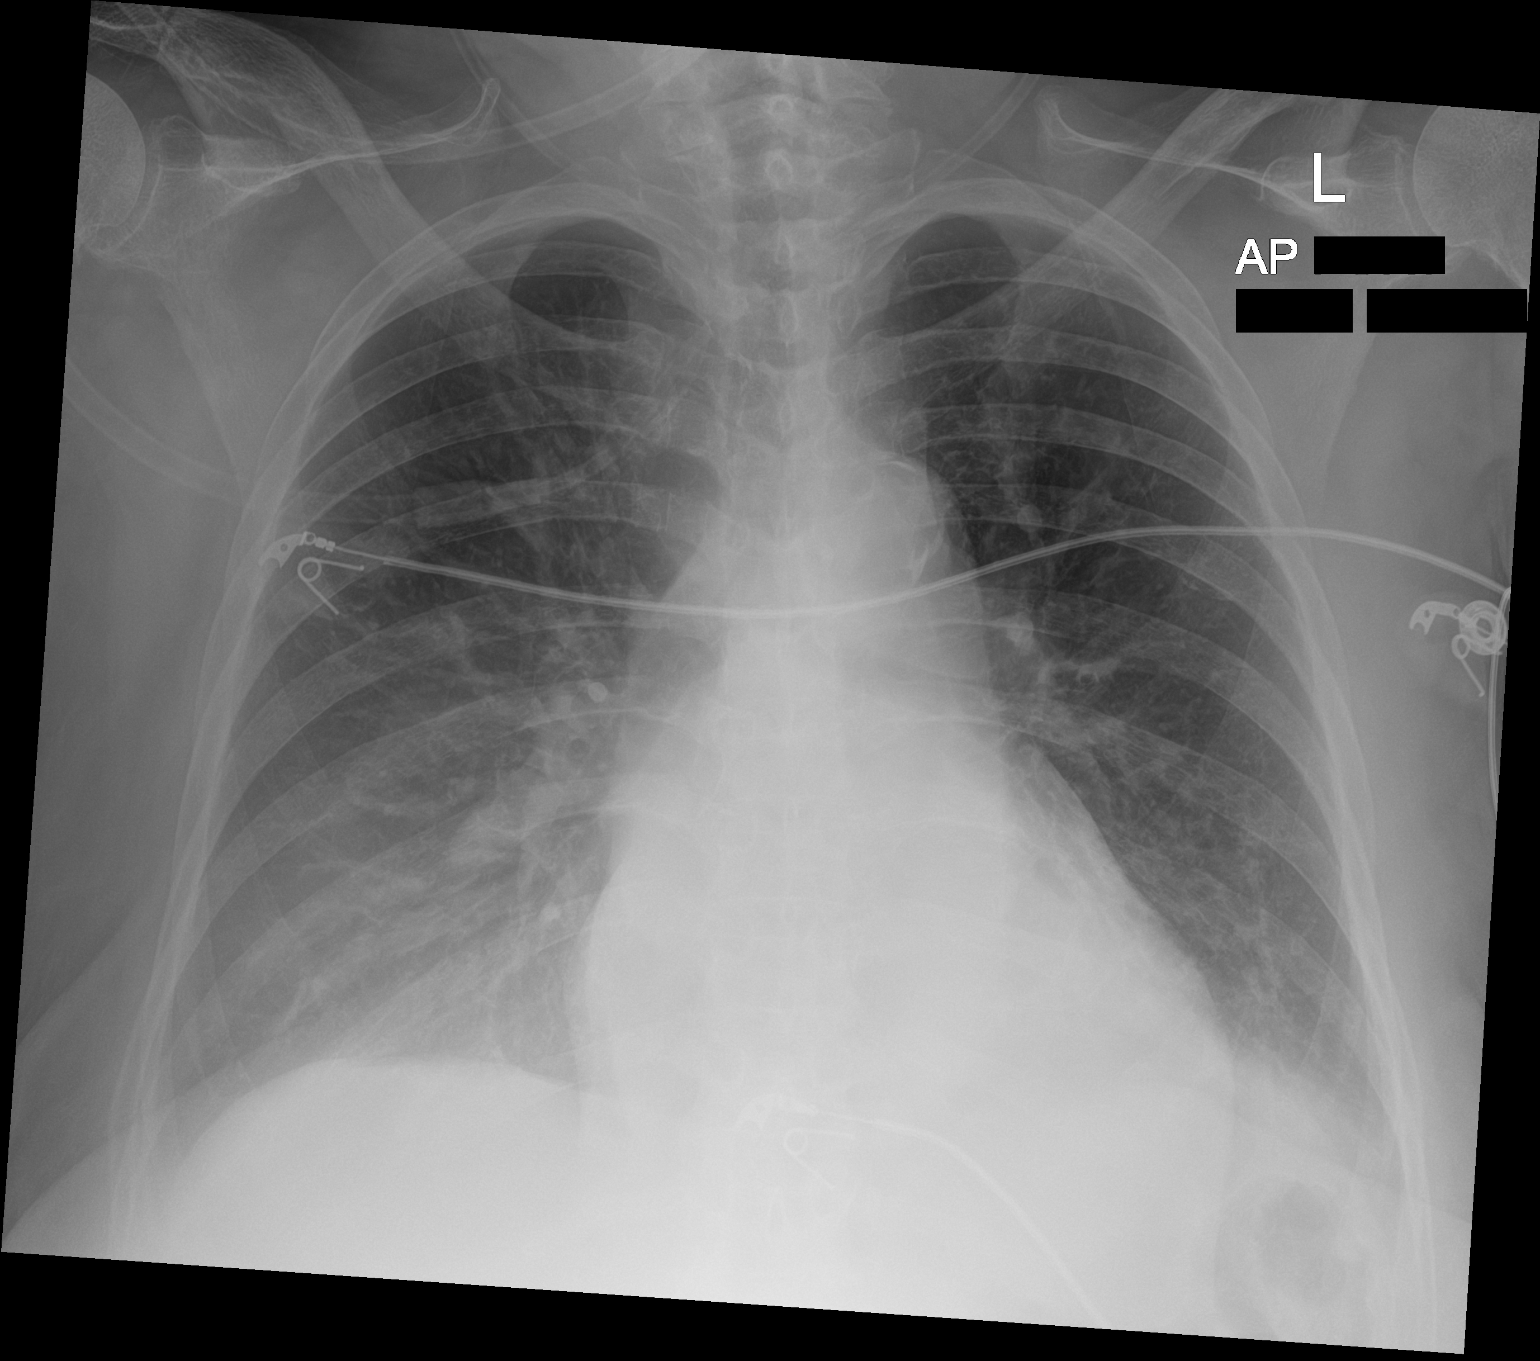

[1 of 1 positions shown; findings below may reference images not displayed]

FINDINGS: Mild cardiomegaly, stable. Mediastinal silhouette within normal
limits. Aortic atherosclerosis.

Lungs normally inflated. Veiling opacity overlying the
hemidiaphragms bilaterally consistent with small layering effusions.
Superimposed patchy and streaky bibasilar opacities favored to
reflect atelectasis, although infiltrates could be considered in the
correct clinical setting. Mild perihilar vascular congestion without
overt pulmonary edema. No pneumothorax.

No acute osseous finding.
IMPRESSION: 1. Veiling opacities overlying the hemidiaphragms bilaterally,
consistent with small layering bilateral pleural effusions.
2. Superimposed patchy and streaky bibasilar opacities, favored to
reflect atelectasis, although infiltrates could be considered in the
correct clinical setting.
3. Stable cardiomegaly with mild perihilar vascular congestion
without overt pulmonary edema.
4.  Aortic Atherosclerosis ([id number]).
